# Patient Record
Sex: Female | Born: 1980 | Race: White | Hispanic: No | Marital: Married | Smoking: Former smoker
Health system: Southern US, Community
[De-identification: ages and names within clinical notes are randomized; demographics above are authoritative.]

## PROBLEM LIST (undated history)

## (undated) DIAGNOSIS — M1990 Unspecified osteoarthritis, unspecified site: Secondary | ICD-10-CM

## (undated) DIAGNOSIS — M5030 Other cervical disc degeneration, unspecified cervical region: Secondary | ICD-10-CM

## (undated) DIAGNOSIS — I10 Essential (primary) hypertension: Secondary | ICD-10-CM

## (undated) DIAGNOSIS — R7303 Prediabetes: Secondary | ICD-10-CM

## (undated) DIAGNOSIS — J45909 Unspecified asthma, uncomplicated: Secondary | ICD-10-CM

## (undated) DIAGNOSIS — R609 Edema, unspecified: Secondary | ICD-10-CM

## (undated) DIAGNOSIS — F419 Anxiety disorder, unspecified: Secondary | ICD-10-CM

## (undated) DIAGNOSIS — R7309 Other abnormal glucose: Secondary | ICD-10-CM

## (undated) DIAGNOSIS — F32A Depression, unspecified: Secondary | ICD-10-CM

## (undated) DIAGNOSIS — R42 Dizziness and giddiness: Secondary | ICD-10-CM

## (undated) HISTORY — DX: Dizziness and giddiness: R42

## (undated) HISTORY — PX: TOOTH EXTRACTION: SHX859

## (undated) HISTORY — DX: Unspecified asthma, uncomplicated: J45.909

## (undated) HISTORY — DX: Other abnormal glucose: R73.09

## (undated) HISTORY — DX: Essential (primary) hypertension: I10

## (undated) HISTORY — DX: Prediabetes: R73.03

## (undated) HISTORY — DX: Depression, unspecified: F32.A

## (undated) HISTORY — DX: Anxiety disorder, unspecified: F41.9

## (undated) HISTORY — DX: Unspecified osteoarthritis, unspecified site: M19.90

## (undated) HISTORY — DX: Other cervical disc degeneration, unspecified cervical region: M50.30

---

## 2019-09-21 NOTE — L&D Delivery Note (Signed)
Operative Delivery Note At 7:09 PM a viable female was delivered via Vaginal, Vacuum Neurosurgeon).  Presentation: vertex; Position: Right,, Occiput,, Anterior; Station: +3. Nuchal cord x 1  Verbal consent: obtained from patient.  Risks and benefits discussed in detail.  Risks include, but are not limited to the risks of anesthesia, bleeding, infection, damage to maternal tissues, fetal cephalhematoma.  There is also the risk of inability to effect vaginal delivery of the head, or shoulder dystocia that cannot be resolved by established maneuvers, leading to the need for emergency cesarean section.  APGAR & weight  Not available at the time of the note.   Placenta status:delivered whole and intact with 3-vessel cord. Delayed cord clamping was performed for 1 minute.    Anesthesia:  Epidural  Instruments: Vaccum Episiotomy: None Lacerations: 2nd degree Suture Repair: 2.0 vicryl rapide Est. Blood Loss (mL): 202  Mom to postpartum.  Baby to Couplet care / Skin to Skin.    06/28/2020, 7:49 PM

## 2019-11-27 ENCOUNTER — Encounter: Payer: Self-pay | Admitting: General Practice

## 2020-01-31 ENCOUNTER — Ambulatory Visit (INDEPENDENT_AMBULATORY_CARE_PROVIDER_SITE_OTHER): Payer: Self-pay | Admitting: Advanced Practice Midwife

## 2020-01-31 ENCOUNTER — Other Ambulatory Visit: Payer: Self-pay

## 2020-01-31 ENCOUNTER — Encounter: Payer: Self-pay | Admitting: Advanced Practice Midwife

## 2020-01-31 VITALS — BP 119/74 | HR 84 | Ht 65.0 in | Wt 141.0 lb

## 2020-01-31 DIAGNOSIS — F419 Anxiety disorder, unspecified: Secondary | ICD-10-CM

## 2020-01-31 DIAGNOSIS — O09519 Supervision of elderly primigravida, unspecified trimester: Secondary | ICD-10-CM | POA: Insufficient documentation

## 2020-01-31 DIAGNOSIS — O10919 Unspecified pre-existing hypertension complicating pregnancy, unspecified trimester: Secondary | ICD-10-CM

## 2020-01-31 DIAGNOSIS — Z3A15 15 weeks gestation of pregnancy: Secondary | ICD-10-CM

## 2020-01-31 DIAGNOSIS — O0990 Supervision of high risk pregnancy, unspecified, unspecified trimester: Secondary | ICD-10-CM | POA: Insufficient documentation

## 2020-01-31 DIAGNOSIS — F418 Other specified anxiety disorders: Secondary | ICD-10-CM | POA: Insufficient documentation

## 2020-01-31 DIAGNOSIS — I10 Essential (primary) hypertension: Secondary | ICD-10-CM | POA: Insufficient documentation

## 2020-01-31 DIAGNOSIS — O10012 Pre-existing essential hypertension complicating pregnancy, second trimester: Secondary | ICD-10-CM

## 2020-01-31 DIAGNOSIS — O0992 Supervision of high risk pregnancy, unspecified, second trimester: Secondary | ICD-10-CM

## 2020-01-31 DIAGNOSIS — O99342 Other mental disorders complicating pregnancy, second trimester: Secondary | ICD-10-CM

## 2020-01-31 DIAGNOSIS — Z363 Encounter for antenatal screening for malformations: Secondary | ICD-10-CM

## 2020-01-31 LAB — POCT URINALYSIS DIPSTICK OB
Blood, UA: NEGATIVE
Glucose, UA: NEGATIVE
Ketones, UA: NEGATIVE
Leukocytes, UA: NEGATIVE
Nitrite, UA: NEGATIVE
POC,PROTEIN,UA: NEGATIVE

## 2020-01-31 MED ORDER — HYDROXYZINE PAMOATE 25 MG PO CAPS
25.0000 mg | ORAL_CAPSULE | Freq: Three times a day (TID) | ORAL | 0 refills | Status: DC | PRN
Start: 2020-01-31 — End: 2020-04-01

## 2020-01-31 MED ORDER — ASPIRIN EC 81 MG PO TBEC
162.0000 mg | DELAYED_RELEASE_TABLET | Freq: Every day | ORAL | 6 refills | Status: DC
Start: 2020-01-31 — End: 2020-06-30

## 2020-01-31 NOTE — Progress Notes (Signed)
INITIAL OBSTETRICAL VISIT Patient name: Gabriella Schmidt MRN [id number]  Date of birth: Oct 04, 1980 Chief Complaint:   Initial Prenatal Visit  History of Present Illness:   Gabriella Schmidt is a 39 y.o. G1P0 Caucasian female at [redacted]w[redacted]d by 7 week [name] with an Estimated Date of Delivery: 07/23/20 being seen today for her initial obstetrical visit.   Her obstetrical history is significant for first baby, AMA primagravida.   Today she reports doing OK.  Stopped zoloft, feels ok for now. Wants referral to online anxiety therapy.  Has had CHTN on meds for 8 years .Stopped lisinopril w/+_ UPT  Has BP cuff, bps normal.  Patient's last menstrual period was 10/05/2019. Last pap 2018. Results were: normal Review of Systems:   Pertinent items are noted in HPI Denies cramping/contractions, leakage of fluid, vaginal bleeding, abnormal vaginal discharge w/ itching/odor/irritation, headaches, visual changes, shortness of breath, chest pain, abdominal pain, severe nausea/vomiting, or problems with urination or bowel movements unless otherwise stated above.  Pertinent History Reviewed:  Reviewed past medical,surgical, social, obstetrical and family history.  Reviewed problem list, medications and allergies. OB History  Gravida Para Term Preterm AB Living  1            SAB TAB Ectopic Multiple Live Births               # Outcome Date GA Lbr Len/2nd Weight Sex Delivery Anes PTL Lv  1 Current            Physical Assessment:   Vitals:   01/31/20 1415 01/31/20 1417  BP: 119/74   Pulse: 84   Weight: 141 lb (64 kg)   Height:  5\' 5"  (1.651 m)  Body mass index is 23.46 kg/m.       Physical Examination:  General appearance - well appearing, and in no distress  Mental status - alert, oriented to person, place, and time  Psych:  She has a normal mood and affect  Skin - warm and dry, normal color, no suspicious lesions noted  Chest - effort normal, all lung fields clear to auscultation bilaterally  Heart - normal  rate and regular rhythm  Abdomen - soft, nontender  Extremities:  No swelling or varicosities noted    Fetal Heart Rate (bpm): 161 via doppler  Results for orders placed or performed in visit on 01/31/20 (from the past 24 hour(s))  POC Urinalysis Dipstick OB   Collection Time: 01/31/20  2:46 PM  Result Value Ref Range   Color, UA     Clarity, UA     Glucose, UA Negative Negative   Bilirubin, UA     Ketones, UA neg    Spec Grav, UA     Blood, UA neg    pH, UA     POC,PROTEIN,UA Negative Negative, Trace, Small (1+), Moderate (2+), Large (3+), 4+   Urobilinogen, UA     Nitrite, UA neg    Leukocytes, UA Negative Negative   Appearance     Odor      Assessment & Plan:  1) High-Risk Pregnancy G1P0 at [redacted]w[redacted]d with an Estimated Date of Delivery: 07/23/20   2) Initial OB visit  3) CHTN, no meds.  Baseline 24hr urine:___  Baseline CBC/CMP:___  Baby ASA 162mg  @ 12wks [ ]   Growth u/s @ 20, 24, 28, 33, 36wks     2x/wk testing nst/sono @ 36wks or weekly BPP   Deliver @ 39wks:______   Meds:  Meds ordered this encounter  Medications  . aspirin  EC 81 MG tablet    Sig: Take 2 tablets (162 mg total) by mouth daily.    Dispense:  60 tablet    Refill:  6    Order Specific Question:   Supervising Provider    Answer:   Elonda Husky, LUTHER H [[id number]]  . hydrOXYzine (VISTARIL) 25 MG capsule    Sig: Take 1 capsule (25 mg total) by mouth 3 (three) times daily as needed.    Dispense:  30 capsule    Refill:  0    Order Specific Question:   Supervising Provider    Answer:   Tania Ade H [[id number]]    Initial labs obtained; add CMP/24 hr urine protein Take BP weekly; let us know if >140/90 Start ASA 162mg  Continue prenatal vitamins Reviewed n/v relief measures and warning s/s to report Rx vistaril for prn anxiety Reviewed recommended weight gain based on pre-gravid BMI Encouraged well-balanced diet Watched video for carrier screening/genetic testing:  Genetic:  NIPS:  requested Cystic fibrosis  screening requested SMA screening requested Fragile X screening requested Ultrasound discussed; fetal survey: requested Holiday completed  Follow-up: Return in about 3 weeks (around 02/21/2020) for [place], [id number].   Orders Placed This Encounter  Procedures  . Urine Culture  . US OB Comp + 14 Wk  . Genetic Screening  . Hepatitis C antibody  . Obstetric Panel, Including HIV  . Hgb Fractionation Cascade  . Comprehensive metabolic panel  . Protein, urine, 24 hour  . POC Urinalysis Dipstick OB    Christin Fudge DNP, CNM 01/31/2020 3:04 PM

## 2020-01-31 NOTE — Patient Instructions (Signed)
Gabriella Schmidt, I greatly value your feedback.  If you receive a survey following your visit with [name] today, we appreciate you taking the time to fill it out.  Thanks, Nigel Berthold, DNP, [place]!!! It is now [place] at [facility] ([place], [place] [address]) Entrance located off of [address] parking   Nausea & Vomiting  Have saltine crackers or pretzels by your bed and eat a few bites before you raise your head out of bed in the morning  Eat small frequent meals throughout the day instead of large meals  Drink plenty of fluids throughout the day to stay hydrated, just don't drink a lot of fluids with your meals.  This can make your stomach fill up faster making you feel sick  Do not brush your teeth right after you eat  Products with real ginger are good for nausea, like ginger ale and ginger hard candy Make sure it says made with real ginger!  Sucking on sour candy like lemon heads is also good for nausea  If your prenatal vitamins make you nauseated, take them at night so you will sleep through the nausea  Sea Bands  If you feel like you need medicine for the nausea & vomiting please let us know  If you are unable to keep any fluids or food down please let us know   Constipation  Drink plenty of fluid, preferably water, throughout the day  Eat foods high in fiber such as fruits, vegetables, and grains  Exercise, such as walking, is a good way to keep your bowels regular  Drink warm fluids, especially warm prune juice, or decaf coffee  Eat a 1/2 cup of real oatmeal (not instant), 1/2 cup applesauce, and 1/2-1 cup warm prune juice every day  If needed, you may take Colace (docusate sodium) stool softener once or twice a day to help keep the stool soft.   If you still are having problems with constipation, you may take Miralax once daily as needed to help keep your bowels regular.   Home  Blood Pressure Monitoring for Patients   Your provider has recommended that you check your blood pressure (BP) at least once a week at home. If you do not have a blood pressure cuff at home, one will be provided for you. Contact your provider if you have not received your monitor within 1 week.   Helpful Tips for Accurate Home Blood Pressure Checks  . Don't smoke, exercise, or drink caffeine 30 minutes before checking your BP . Use the restroom before checking your BP (a full bladder can raise your pressure) . Relax in a comfortable upright chair . Feet on the ground . Left arm resting comfortably on a flat surface at the level of your heart . Legs uncrossed . Back supported . Sit quietly and don't talk . Place the cuff on your bare arm . Adjust snuggly, so that only two fingertips can fit between your skin and the top of the cuff . Check 2 readings separated by at least one minute . Keep a log of your BP readings . For a visual, please reference this diagram: http://ccnc.care/bpdiagram  Provider Name: [facility]     Phone: [phone]  Zone 1: ALL CLEAR  Continue to monitor your symptoms:  . BP reading is less than 140 (top number) or less than 90 (bottom number)  . No right upper stomach pain .  No headaches or seeing spots . No feeling nauseated or throwing up . No swelling in face and hands  Zone 2: CAUTION Call your doctor's office for any of the following:  . BP reading is greater than 140 (top number) or greater than 90 (bottom number)  . Stomach pain under your ribs in the middle or right side . Headaches or seeing spots . Feeling nauseated or throwing up . Swelling in face and hands  Zone 3: EMERGENCY  Seek immediate medical care if you have any of the following:  . BP reading is greater than160 (top number) or greater than 110 (bottom number) . Severe headaches not improving with Tylenol . Serious difficulty catching your breath . Any worsening symptoms  from Zone 2    First Trimester of Pregnancy The first trimester of pregnancy is from week 1 until the end of week 12 (months 1 through 3). A week after a sperm fertilizes an egg, the egg will implant on the wall of the uterus. This embryo will begin to develop into a baby. Genes from you and your partner are forming the baby. The female genes determine whether the baby is a boy or a girl. At 6-8 weeks, the eyes and face are formed, and the heartbeat can be seen on ultrasound. At the end of 12 weeks, all the baby's organs are formed.  Now that you are pregnant, you will want to do everything you can to have a healthy baby. Two of the most important things are to get good prenatal care and to follow your health care provider's instructions. Prenatal care is all the medical care you receive before the baby's birth. This care will help prevent, find, and treat any problems during the pregnancy and childbirth. BODY CHANGES Your body goes through many changes during pregnancy. The changes vary from woman to woman.   You may gain or lose a couple of pounds at first.  You may feel sick to your stomach (nauseous) and throw up (vomit). If the vomiting is uncontrollable, call your health care provider.  You may tire easily.  You may develop headaches that can be relieved by medicines approved by your health care provider.  You may urinate more often. Painful urination may mean you have a bladder infection.  You may develop heartburn as a result of your pregnancy.  You may develop constipation because certain hormones are causing the muscles that push waste through your intestines to slow down.  You may develop hemorrhoids or swollen, bulging veins (varicose veins).  Your breasts may begin to grow larger and become tender. Your nipples may stick out more, and the tissue that surrounds them (areola) may become darker.  Your gums may bleed and may be sensitive to brushing and flossing.  Dark spots or  blotches (chloasma, mask of pregnancy) may develop on your face. This will likely fade after the baby is born.  Your menstrual periods will stop.  You may have a loss of appetite.  You may develop cravings for certain kinds of food.  You may have changes in your emotions from day to day, such as being excited to be pregnant or being concerned that something may go wrong with the pregnancy and baby.  You may have more vivid and strange dreams.  You may have changes in your hair. These can include thickening of your hair, rapid growth, and changes in texture. Some women also have hair loss during or after pregnancy, or hair that feels dry  or thin. Your hair will most likely return to normal after your baby is born. WHAT TO EXPECT AT YOUR PRENATAL VISITS During a routine prenatal visit:  You will be weighed to make sure you and the baby are growing normally.  Your blood pressure will be taken.  Your abdomen will be measured to track your baby's growth.  The fetal heartbeat will be listened to starting around week 10 or 12 of your pregnancy.  Test results from any previous visits will be discussed. Your health care provider may ask you:  How you are feeling.  If you are feeling the baby move.  If you have had any abnormal symptoms, such as leaking fluid, bleeding, severe headaches, or abdominal cramping.  If you have any questions. Other tests that may be performed during your first trimester include:  Blood tests to find your blood type and to check for the presence of any previous infections. They will also be used to check for low iron levels (anemia) and Rh antibodies. Later in the pregnancy, blood tests for diabetes will be done along with other tests if problems develop.  Urine tests to check for infections, diabetes, or protein in the urine.  An ultrasound to confirm the proper growth and development of the baby.  An amniocentesis to check for possible genetic  problems.  Fetal screens for spina bifida and Down syndrome.  You may need other tests to make sure you and the baby are doing well. HOME CARE INSTRUCTIONS  Medicines  Follow your health care provider's instructions regarding medicine use. Specific medicines may be either safe or unsafe to take during pregnancy.  Take your prenatal vitamins as directed.  If you develop constipation, try taking a stool softener if your health care provider approves. Diet  Eat regular, well-balanced meals. Choose a variety of foods, such as meat or vegetable-based protein, fish, milk and low-fat dairy products, vegetables, fruits, and whole grain breads and cereals. Your health care provider will help you determine the amount of weight gain that is right for you.  Avoid raw meat and uncooked cheese. These carry germs that can cause birth defects in the baby.  Eating four or five small meals rather than three large meals a day may help relieve nausea and vomiting. If you start to feel nauseous, eating a few soda crackers can be helpful. Drinking liquids between meals instead of during meals also seems to help nausea and vomiting.  If you develop constipation, eat more high-fiber foods, such as fresh vegetables or fruit and whole grains. Drink enough fluids to keep your urine clear or pale yellow. Activity and Exercise  Exercise only as directed by your health care provider. Exercising will help you:  Control your weight.  Stay in shape.  Be prepared for labor and delivery.  Experiencing pain or cramping in the lower abdomen or low back is a good sign that you should stop exercising. Check with your health care provider before continuing normal exercises.  Try to avoid standing for long periods of time. Move your legs often if you must stand in one place for a long time.  Avoid heavy lifting.  Wear low-heeled shoes, and practice good posture.  You may continue to have sex unless your health care  provider directs you otherwise. Relief of Pain or Discomfort  Wear a good support bra for breast tenderness.    Take warm sitz baths to soothe any pain or discomfort caused by hemorrhoids. Use hemorrhoid cream if your  health care provider approves.    Rest with your legs elevated if you have leg cramps or low back pain.  If you develop varicose veins in your legs, wear support hose. Elevate your feet for 15 minutes, 3-4 times a day. Limit salt in your diet. Prenatal Care  Schedule your prenatal visits by the twelfth week of pregnancy. They are usually scheduled monthly at first, then more often in the last 2 months before delivery.  Write down your questions. Take them to your prenatal visits.  Keep all your prenatal visits as directed by your health care provider. Safety  Wear your seat belt at all times when driving.  Make a list of emergency phone numbers, including numbers for family, friends, the hospital, and police and fire departments. General Tips  Ask your health care provider for a referral to a local prenatal education class. Begin classes no later than at the beginning of month 6 of your pregnancy.  Ask for help if you have counseling or nutritional needs during pregnancy. Your health care provider can offer advice or refer you to specialists for help with various needs.  Do not use hot tubs, steam rooms, or saunas.  Do not douche or use tampons or scented sanitary pads.  Do not cross your legs for long periods of time.  Avoid cat litter boxes and soil used by cats. These carry germs that can cause birth defects in the baby and possibly loss of the fetus by miscarriage or stillbirth.  Avoid all smoking, herbs, alcohol, and medicines not prescribed by your health care provider. Chemicals in these affect the formation and growth of the baby.  Schedule a dentist appointment. At home, brush your teeth with a soft toothbrush and be gentle when you floss. SEEK MEDICAL  CARE IF:   You have dizziness.  You have mild pelvic cramps, pelvic pressure, or nagging pain in the abdominal area.  You have persistent nausea, vomiting, or diarrhea.  You have a bad smelling vaginal discharge.  You have pain with urination.  You notice increased swelling in your face, hands, legs, or ankles. SEEK IMMEDIATE MEDICAL CARE IF:   You have a fever.  You are leaking fluid from your vagina.  You have spotting or bleeding from your vagina.  You have severe abdominal cramping or pain.  You have rapid weight gain or loss.  You vomit blood or material that looks like coffee grounds.  You are exposed to [name] measles and have never had them.  You are exposed to fifth disease or chickenpox.  You develop a severe headache.  You have shortness of breath.  You have any kind of trauma, such as from a fall or a car accident. Document Released: 08/31/2001 Document Revised: 01/21/2014 Document Reviewed: 07/17/2013 [facility] Patient Information 2015 [place], [place]. This information is not intended to replace advice given to you by your health care provider. Make sure you discuss any questions you have with your health care provider.  Coronavirus (COVID-19) Are you at risk?  Are you at risk for the Coronavirus (COVID-19)?  To be considered HIGH RISK for Coronavirus (COVID-19), you have to meet the following criteria:  . Traveled to [place], [place], [place], [place] or [place]; or in the [place] to [place], [place], [place], or [place]; and have fever, cough, and shortness of breath within the last 2 weeks of travel OR . Been in close contact with a person diagnosed with COVID-19 within the last 2 weeks and  have fever, cough, and shortness of breath . IF YOU DO NOT MEET THESE CRITERIA, YOU ARE CONSIDERED LOW RISK FOR COVID-19.  What to do if you are HIGH RISK for COVID-19?  Marland Kitchen If you are having a medical emergency, call 911. . Seek medical care right away.  Before you go to a doctor's office, urgent care or emergency department, call ahead and tell them about your recent travel, contact with someone diagnosed with COVID-19, and your symptoms. You should receive instructions from your physician's office regarding next steps of care.  . When you arrive at healthcare provider, tell the healthcare staff immediately you have returned from visiting [place], [place], [place], [place] or [place]; or traveled in the [place] to [place], [place], [place], or [place]; in the last two weeks or you have been in close contact with a person diagnosed with COVID-19 in the last 2 weeks.   . Tell the health care staff about your symptoms: fever, cough and shortness of breath. . After you have been seen by a medical provider, you will be either: o Tested for (COVID-19) and discharged home on quarantine except to seek medical care if symptoms worsen, and asked to  - Stay home and avoid contact with others until you get your results (4-5 days)  - Avoid travel on public transportation if possible (such as bus, train, or airplane) or o Sent to the Emergency Department by EMS for evaluation, COVID-19 testing, and possible admission depending on your condition and test results.  What to do if you are LOW RISK for COVID-19?  Reduce your risk of any infection by using the same precautions used for avoiding the common cold or flu:  Marland Kitchen Wash your hands often with soap and warm water for at least 20 seconds.  If soap and water are not readily available, use an alcohol-based hand sanitizer with at least 60% alcohol.  . If coughing or sneezing, cover your mouth and nose by coughing or sneezing into the elbow areas of your shirt or coat, into a tissue or into your sleeve (not your hands). . Avoid shaking hands with others and consider head nods or verbal greetings only. . Avoid touching your eyes, nose, or mouth with unwashed hands.  . Avoid close contact with people who are  sick. . Avoid places or events with large numbers of people in one location, like concerts or sporting events. . Carefully consider travel plans you have or are making. . If you are planning any travel outside or inside the [name], visit the CDC's Travelers' Health webpage for the latest health notices. . If you have some symptoms but not all symptoms, continue to monitor at home and seek medical attention if your symptoms worsen. . If you are having a medical emergency, call 911.   [place] / e-Visit: [email or web address]         [facility]: [facility]: [id number]                   [facility]: [phone]     Safe Medications in Pregnancy   Acne: Benzoyl Peroxide Salicylic Acid  Backache/Headache: Tylenol: 2 regular strength every 4 hours OR              2 Extra strength every 6 hours  Colds/Coughs/Allergies: Benadryl (alcohol free) 25 mg every 6 hours as needed Breath right strips Claritin Cepacol throat  lozenges Chloraseptic throat spray Cold-Eeze- up to three times per day Cough drops, alcohol free Flonase (by prescription only) Guaifenesin Mucinex Robitussin DM (plain only, alcohol free) Saline nasal spray/drops Sudafed (pseudoephedrine) & Actifed ** use only after [redacted] weeks gestation and if you do not have high blood pressure Tylenol Vicks Vaporub Zinc lozenges Zyrtec   Constipation: Colace Ducolax suppositories Fleet enema Glycerin suppositories Metamucil Milk of magnesia Miralax Senokot Smooth move tea  Diarrhea: Kaopectate Imodium A-D  *NO pepto Bismol  Hemorrhoids: Anusol Anusol HC Preparation H Tucks  Indigestion: Tums Maalox Mylanta Zantac  Pepcid  Insomnia: Benadryl (alcohol free) 25mg  every 6 hours as needed Tylenol PM Unisom, no Gelcaps  Leg  Cramps: Tums MagGel  Nausea/Vomiting:  Bonine Dramamine Emetrol Ginger extract Sea bands Meclizine  Nausea medication to take during pregnancy:  Unisom (doxylamine succinate 25 mg tablets) Take one tablet daily at bedtime. If symptoms are not adequately controlled, the dose can be increased to a maximum recommended dose of two tablets daily (1/2 tablet in the morning, 1/2 tablet mid-afternoon and one at bedtime). Vitamin B6 100mg  tablets. Take one tablet twice a day (up to 200 mg per day).  Skin Rashes: Aveeno products Benadryl cream or 25mg  every 6 hours as needed Calamine Lotion 1% cortisone cream  Yeast infection: Gyne-lotrimin 7 Monistat 7   **If taking multiple medications, please check labels to avoid duplicating the same active ingredients **take medication as directed on the label ** Do not exceed 4000 mg of tylenol in 24 hours **Do not take medications that contain aspirin or ibuprofen

## 2020-02-01 ENCOUNTER — Telehealth: Payer: Self-pay | Admitting: *Deleted

## 2020-02-01 LAB — COMPREHENSIVE METABOLIC PANEL
ALT: 10 IU/L (ref 0–32)
AST: 12 IU/L (ref 0–40)
Albumin/Globulin Ratio: 1.4 (ref 1.2–2.2)
Albumin: 4 g/dL (ref 3.8–4.8)
Alkaline Phosphatase: 85 IU/L (ref 39–117)
BUN/Creatinine Ratio: 20 (ref 9–23)
BUN: 9 mg/dL (ref 6–20)
Bilirubin Total: 0.3 mg/dL (ref 0.0–1.2)
CO2: 21 mmol/L (ref 20–29)
Calcium: 9.4 mg/dL (ref 8.7–10.2)
Chloride: 103 mmol/L (ref 96–106)
Creatinine, Ser: 0.46 mg/dL — ABNORMAL LOW (ref 0.57–1.00)
GFR calc Af Amer: 148 mL/min/{1.73_m2} (ref >59)
GFR calc non Af Amer: 128 mL/min/{1.73_m2} (ref >59)
Globulin, Total: 2.9 g/dL (ref 1.5–4.5)
Glucose: 77 mg/dL (ref 65–99)
Potassium: 4.8 mmol/L (ref 3.5–5.2)
Sodium: 137 mmol/L (ref 134–144)
Total Protein: 6.9 g/dL (ref 6.0–8.5)

## 2020-02-01 NOTE — Telephone Encounter (Signed)
Referral faxed to [facility].

## 2020-02-02 LAB — URINE CULTURE

## 2020-02-04 LAB — OBSTETRIC PANEL, INCLUDING HIV
Antibody Screen: NEGATIVE
Basophils Absolute: 0 10*3/uL (ref 0.0–0.2)
Basos: 0 %
EOS (ABSOLUTE): 0.1 10*3/uL (ref 0.0–0.4)
Eos: 1 %
HIV Screen 4th Generation wRfx: NONREACTIVE
Hematocrit: 35 % (ref 34.0–46.6)
Hemoglobin: 12.1 g/dL (ref 11.1–15.9)
Hepatitis B Surface Ag: NEGATIVE
Immature Grans (Abs): 0.1 10*3/uL (ref 0.0–0.1)
Immature Granulocytes: 1 %
Lymphocytes Absolute: 2.5 10*3/uL (ref 0.7–3.1)
Lymphs: 25 %
MCH: 30.1 pg (ref 26.6–33.0)
MCHC: 34.6 g/dL (ref 31.5–35.7)
MCV: 87 fL (ref 79–97)
Monocytes Absolute: 0.5 10*3/uL (ref 0.1–0.9)
Monocytes: 6 %
Neutrophils Absolute: 6.5 10*3/uL (ref 1.4–7.0)
Neutrophils: 67 %
Platelets: 346 10*3/uL (ref 150–450)
RBC: 4.02 x10E6/uL (ref 3.77–5.28)
RDW: 11.8 % (ref 11.7–15.4)
RPR Ser Ql: NONREACTIVE
Rh Factor: POSITIVE
Rubella Antibodies, IGG: <0.9 index — ABNORMAL LOW (ref >0.99)
WBC: 9.6 10*3/uL (ref 3.4–10.8)

## 2020-02-04 LAB — HGB FRACTIONATION CASCADE
Hgb A2: 2.7 % (ref 1.8–3.2)
Hgb A: 97.3 % (ref 96.4–98.8)
Hgb F: 0 % (ref 0.0–2.0)
Hgb S: 0 %

## 2020-02-04 LAB — HEPATITIS C ANTIBODY: Hep C Virus Ab: <0.1 s/co ratio (ref 0.0–0.9)

## 2020-02-19 ENCOUNTER — Encounter: Payer: Self-pay | Admitting: *Deleted

## 2020-02-19 NOTE — Progress Notes (Signed)
Received fax from [facility] that they have tried to reach patient twice on 5/24 and 5/25.

## 2020-02-21 ENCOUNTER — Other Ambulatory Visit (HOSPITAL_COMMUNITY): Payer: Self-pay | Admitting: Advanced Practice Midwife

## 2020-02-21 ENCOUNTER — Encounter: Payer: Self-pay | Admitting: Obstetrics & Gynecology

## 2020-02-21 ENCOUNTER — Other Ambulatory Visit (HOSPITAL_COMMUNITY)
Admission: RE | Admit: 2020-02-21 | Discharge: 2020-02-21 | Disposition: A | Discharge status: Home / Self Care | Source: Ambulatory Visit | Attending: Obstetrics & Gynecology | Admitting: Obstetrics & Gynecology

## 2020-02-21 ENCOUNTER — Ambulatory Visit (INDEPENDENT_AMBULATORY_CARE_PROVIDER_SITE_OTHER)

## 2020-02-21 ENCOUNTER — Ambulatory Visit (INDEPENDENT_AMBULATORY_CARE_PROVIDER_SITE_OTHER): Admitting: Obstetrics & Gynecology

## 2020-02-21 VITALS — BP 105/71 | HR 81 | Wt 142.5 lb

## 2020-02-21 DIAGNOSIS — Z3A18 18 weeks gestation of pregnancy: Secondary | ICD-10-CM | POA: Diagnosis not present

## 2020-02-21 DIAGNOSIS — O10912 Unspecified pre-existing hypertension complicating pregnancy, second trimester: Secondary | ICD-10-CM

## 2020-02-21 DIAGNOSIS — Z01419 Encounter for gynecological examination (general) (routine) without abnormal findings: Secondary | ICD-10-CM | POA: Insufficient documentation

## 2020-02-21 DIAGNOSIS — Z363 Encounter for antenatal screening for malformations: Secondary | ICD-10-CM | POA: Diagnosis not present

## 2020-02-21 DIAGNOSIS — O10919 Unspecified pre-existing hypertension complicating pregnancy, unspecified trimester: Secondary | ICD-10-CM

## 2020-02-21 DIAGNOSIS — O09512 Supervision of elderly primigravida, second trimester: Secondary | ICD-10-CM

## 2020-02-21 DIAGNOSIS — O0992 Supervision of high risk pregnancy, unspecified, second trimester: Secondary | ICD-10-CM

## 2020-02-21 DIAGNOSIS — Z1389 Encounter for screening for other disorder: Secondary | ICD-10-CM

## 2020-02-21 DIAGNOSIS — Z331 Pregnant state, incidental: Secondary | ICD-10-CM

## 2020-02-21 LAB — POCT URINALYSIS DIPSTICK OB
Blood, UA: NEGATIVE
Glucose, UA: NEGATIVE
Ketones, UA: NEGATIVE
Nitrite, UA: NEGATIVE
POC,PROTEIN,UA: NEGATIVE

## 2020-02-21 NOTE — Progress Notes (Signed)
[name] [id number] wks,cephalic,posterior placenta gr 0,normal left ovary,right ovary not visualized,svp of fluid 3.9 cm,fhr 159 bpm,EFW 255 g 79%,anatomy complete,no obvious abnormalities

## 2020-02-21 NOTE — Progress Notes (Signed)
° °  HIGH-RISK PREGNANCY VISIT Patient name: Gabriella Schmidt MRN [id number]  Date of birth: 1980-11-10 Chief Complaint:   Routine Prenatal Visit (Ultrasound and Pap Smear)  History of Present Illness:   Gabriella Schmidt is a 39 y.o. G1P0 female at [redacted]w[redacted]d with an Estimated Date of Delivery: 07/23/20 being seen today for ongoing management of a high-risk pregnancy complicated by chronic hypertension currently on no meds.  Today she reports no complaints.  Depression screen PHQ 2/9 01/31/2020  Decreased Interest 1  Down, Depressed, Hopeless 0  PHQ - 2 Score 1  Altered sleeping 2  Tired, decreased energy 3  Change in appetite 2  Feeling bad or failure about yourself  0  Trouble concentrating 0  Moving slowly or fidgety/restless 2  Suicidal thoughts 0  PHQ-9 Score 10    Contractions: Not present. Vag. Bleeding: None.  Movement: Present. denies leaking of fluid.  Review of Systems:   Pertinent items are noted in HPI Denies abnormal vaginal discharge w/ itching/odor/irritation, headaches, visual changes, shortness of breath, chest pain, abdominal pain, severe nausea/vomiting, or problems with urination or bowel movements unless otherwise stated above. Pertinent History Reviewed:  Reviewed past medical,surgical, social, obstetrical and family history.  Reviewed problem list, medications and allergies. Physical Assessment:   Vitals:   02/21/20 1420  BP: 105/71  Pulse: 81  Weight: 142 lb 8 oz (64.6 kg)  Body mass index is 23.71 kg/m.           Physical Examination:   General appearance: alert, well appearing, and in no distress  Mental status: alert, oriented to person, place, and time  Skin: warm & dry   Extremities: Edema: None    Cardiovascular: normal heart rate noted  Respiratory: normal respiratory effort, no distress  Abdomen: gravid, soft, non-tender  Pelvic: Cervical exam deferred         Fetal Status:     Movement: Present    Fetal Surveillance Testing today: sonogram    Chaperone: Levy Pupa    No results found for this or any previous visit (from the past 24 hour(s)).  Assessment & Plan:  1) High-risk pregnancy G1P0 at [redacted]w[redacted]d with an Estimated Date of Delivery: 07/23/20   2) CHTN, stable, no meds, was on lisinopril for 10 years, if remains off meds weekly BPP at 32 weeks with EFW 28,32,36 weeks  Meds: No orders of the defined types were placed in this encounter.   Labs/procedures today: sonogram  Treatment Plan:  As above  Reviewed: Preterm labor symptoms and general obstetric precautions including but not limited to vaginal bleeding, contractions, leaking of fluid and fetal movement were reviewed in detail with the patient.  All questions were answered. Has home bp cuff. Rx faxed to . Check bp weekly, let us know if >140/90.   Follow-up: Return in about 4 weeks (around 03/20/2020) for [place].  Orders Placed This Encounter  Procedures   POC Urinalysis Dipstick OB    H   02/21/2020 3:00 PM

## 2020-02-22 LAB — PROTEIN, URINE, 24 HOUR
Protein, 24H Urine: 65 mg/24 hr (ref 30–150)
Protein, Ur: 8.7 mg/dL

## 2020-02-25 LAB — CYTOLOGY - PAP
Comment: NEGATIVE
Diagnosis: NEGATIVE
High risk HPV: NEGATIVE

## 2020-03-25 ENCOUNTER — Ambulatory Visit (INDEPENDENT_AMBULATORY_CARE_PROVIDER_SITE_OTHER): Admitting: Obstetrics & Gynecology

## 2020-03-25 ENCOUNTER — Encounter: Payer: Self-pay | Admitting: Obstetrics & Gynecology

## 2020-03-25 ENCOUNTER — Other Ambulatory Visit: Payer: Self-pay

## 2020-03-25 VITALS — BP 125/76 | HR 109 | Wt 152.0 lb

## 2020-03-25 DIAGNOSIS — Z3A22 22 weeks gestation of pregnancy: Secondary | ICD-10-CM

## 2020-03-25 DIAGNOSIS — Z1389 Encounter for screening for other disorder: Secondary | ICD-10-CM

## 2020-03-25 DIAGNOSIS — O0992 Supervision of high risk pregnancy, unspecified, second trimester: Secondary | ICD-10-CM

## 2020-03-25 DIAGNOSIS — O10912 Unspecified pre-existing hypertension complicating pregnancy, second trimester: Secondary | ICD-10-CM

## 2020-03-25 DIAGNOSIS — Z331 Pregnant state, incidental: Secondary | ICD-10-CM

## 2020-03-25 DIAGNOSIS — O10919 Unspecified pre-existing hypertension complicating pregnancy, unspecified trimester: Secondary | ICD-10-CM

## 2020-03-25 LAB — POCT URINALYSIS DIPSTICK OB
Blood, UA: NEGATIVE
Glucose, UA: NEGATIVE
Ketones, UA: NEGATIVE
Leukocytes, UA: NEGATIVE
Nitrite, UA: NEGATIVE
POC,PROTEIN,UA: NEGATIVE

## 2020-03-25 NOTE — Progress Notes (Signed)
Patient ID: Kaesha Kirsch, female   DOB: 20-Sep-1981, 39 y.o.   MRN: [id number]   [facility] PREGNANCY VISIT Patient name: Olesya Wike MRN [id number]  Date of birth: [birth year] Chief Complaint:   High Risk Gestation  History of Present Illness:   Jackline Castilla is a 39 y.o. G1P0 female at [redacted]w[redacted]d with an Estimated Date of Delivery: 07/23/20 being seen today for ongoing management of a high-risk pregnancy complicated by [facility] on no meds currently.  Today she reports back pain .  Depression screen PHQ 2/9 01/31/2020  Decreased Interest 1  Down, Depressed, Hopeless 0  PHQ - 2 Score 1  Altered sleeping 2  Tired, decreased energy 3  Change in appetite 2  Feeling bad or failure about yourself  0  Trouble concentrating 0  Moving slowly or fidgety/restless 2  Suicidal thoughts 0  PHQ-9 Score 10    Contractions: Not present.  .  Movement: Present. denies leaking of fluid.  Review of Systems:   Pertinent items are noted in HPI Denies abnormal vaginal discharge w/ itching/odor/irritation, headaches, visual changes, shortness of breath, chest pain, abdominal pain, severe nausea/vomiting, or problems with urination or bowel movements unless otherwise stated above. Pertinent History Reviewed:  Reviewed past medical,surgical, social, obstetrical and family history.  Reviewed problem list, medications and allergies. Physical Assessment:   Vitals:   03/25/20 1019  BP: 125/76  Pulse: (!) 109  Weight: 152 lb (68.9 kg)  Body mass index is 25.29 kg/m.           Physical Examination:   General appearance: alert, well appearing, and in no distress  Mental status: alert, oriented to person, place, and time  Skin: warm & dry   Extremities: Edema: None    Cardiovascular: normal heart rate noted  Respiratory: normal respiratory effort, no distress  Abdomen: gravid, soft, non-tender  Pelvic: Cervical exam deferred         Fetal Status:     Movement: Present    Fetal Surveillance Testing today:     Chaperone:     Results for orders placed or performed in visit on 03/25/20 (from the past 24 hour(s))  POC Urinalysis Dipstick OB   Collection Time: 03/25/20 10:19 AM  Result Value Ref Range   Color, UA     Clarity, UA     Glucose, UA Negative Negative   Bilirubin, UA     Ketones, UA neg    Spec Grav, UA     Blood, UA neg    pH, UA     POC,PROTEIN,UA Negative Negative, Trace, Small (1+), Moderate (2+), Large (3+), 4+   Urobilinogen, UA     Nitrite, UA neg    Leukocytes, UA Negative Negative   Appearance     Odor      Assessment & Plan:  1) High-risk pregnancy G1P0 at [redacted]w[redacted]d with an Estimated Date of Delivery: 07/23/20   2) CHTN, no meds, was on lisinopril for 10 years,     Meds: No orders of the defined types were placed in this encounter.   Labs/procedures today:   Treatment Plan:  Per plan, if no meds weekly BPP 32 weeks EFW 28, 32,36 weeks, if meds usual CHTN protocol  Reviewed: Preterm labor symptoms and general obstetric precautions including but not limited to vaginal bleeding, contractions, leaking of fluid and fetal movement were reviewed in detail with the patient.  All questions were answered.  home bp cuff. Rx faxed to . Check bp weekly, let us know if >140/90.  Follow-up: Return in about 4 weeks (around 04/22/2020) for PN2, HROB.  Orders Placed This Encounter  Procedures  . POC Urinalysis Dipstick OB   Mertie Clause   03/25/2020 11:13 AM

## 2020-03-30 ENCOUNTER — Other Ambulatory Visit: Payer: Self-pay

## 2020-04-01 ENCOUNTER — Telehealth: Payer: Self-pay | Admitting: Obstetrics & Gynecology

## 2020-04-01 MED ORDER — HYDROXYZINE PAMOATE 25 MG PO CAPS: 25.0000 mg | ORAL_CAPSULE | Freq: Three times a day (TID) | ORAL | 2 refills | Status: DC | PRN

## 2020-04-01 NOTE — Telephone Encounter (Signed)
I did not receive a MyChart request but refilled it nonetheless

## 2020-04-01 NOTE — Telephone Encounter (Signed)
Patient was calling in because she needs a refill on her medication.  She stated she had sent a my-chart message and that he needs the refill by today because she is going out of town for three weeks.

## 2020-04-08 ENCOUNTER — Encounter: Payer: Self-pay | Admitting: *Deleted

## 2020-04-08 DIAGNOSIS — O0992 Supervision of high risk pregnancy, unspecified, second trimester: Secondary | ICD-10-CM

## 2020-04-24 ENCOUNTER — Ambulatory Visit (INDEPENDENT_AMBULATORY_CARE_PROVIDER_SITE_OTHER): Admitting: Women's Health

## 2020-04-24 ENCOUNTER — Other Ambulatory Visit: Payer: Self-pay

## 2020-04-24 ENCOUNTER — Other Ambulatory Visit

## 2020-04-24 ENCOUNTER — Encounter: Payer: Self-pay | Admitting: Women's Health

## 2020-04-24 VITALS — BP 116/72 | HR 87 | Wt 159.0 lb

## 2020-04-24 DIAGNOSIS — Z3483 Encounter for supervision of other normal pregnancy, third trimester: Secondary | ICD-10-CM

## 2020-04-24 DIAGNOSIS — O0992 Supervision of high risk pregnancy, unspecified, second trimester: Secondary | ICD-10-CM

## 2020-04-24 DIAGNOSIS — O10919 Unspecified pre-existing hypertension complicating pregnancy, unspecified trimester: Secondary | ICD-10-CM

## 2020-04-24 DIAGNOSIS — Z1389 Encounter for screening for other disorder: Secondary | ICD-10-CM

## 2020-04-24 DIAGNOSIS — O10912 Unspecified pre-existing hypertension complicating pregnancy, second trimester: Secondary | ICD-10-CM

## 2020-04-24 DIAGNOSIS — Z23 Encounter for immunization: Secondary | ICD-10-CM

## 2020-04-24 DIAGNOSIS — Z3A27 27 weeks gestation of pregnancy: Secondary | ICD-10-CM

## 2020-04-24 DIAGNOSIS — Z3403 Encounter for supervision of normal first pregnancy, third trimester: Secondary | ICD-10-CM

## 2020-04-24 DIAGNOSIS — Z331 Pregnant state, incidental: Secondary | ICD-10-CM

## 2020-04-24 DIAGNOSIS — Z113 Encounter for screening for infections with a predominantly sexual mode of transmission: Secondary | ICD-10-CM

## 2020-04-24 LAB — POCT URINALYSIS DIPSTICK OB
Blood, UA: NEGATIVE
Glucose, UA: NEGATIVE
Ketones, UA: NEGATIVE
Leukocytes, UA: NEGATIVE
Nitrite, UA: NEGATIVE
POC,PROTEIN,UA: NEGATIVE

## 2020-04-24 NOTE — Patient Instructions (Signed)
Gabriella Schmidt, I greatly value your feedback.  If you receive a survey following your visit with [name] today, we appreciate you taking the time to fill it out.  Thanks, Knute Neu, CNM, WHNP-BC   [facility] [place] [facility] ([place], [place] [address]) Entrance C, located off of [address] parking  Go to [email or web address] to register for FREE online childbirth classes   Call the office [phone]) or go to [facility] if:  You begin to have strong, frequent contractions  Your water breaks.  Sometimes it is a big gush of fluid, sometimes it is just a trickle that keeps getting your panties wet or running down your legs  You have vaginal bleeding.  It is normal to have a small amount of spotting if your cervix was checked.   You don't feel your baby moving like normal.  If you don't, get you something to eat and drink and lay down and focus on feeling your baby move.  You should feel at least 10 movements in 2 hours.  If you don't, you should call the office or go to [facility].    Tdap Vaccine  It is recommended that you get the Tdap vaccine during the third trimester of EACH pregnancy to help protect your baby from getting pertussis (whooping cough)  27-36 weeks is the BEST time to do this so that you can pass the protection on to your baby. During pregnancy is better than after pregnancy, but if you are unable to get it during pregnancy it will be offered at the hospital.   You can get this vaccine with [name], at the health department, your family doctor, or some local pharmacies  Everyone who will be around your baby should also be up-to-date on their vaccines before the baby comes. Adults (who are not pregnant) only need 1 dose of Tdap during adulthood.   [place] Pediatricians/Family Doctors:  [facility] [phone]                 [place]  [phone] (usually not accepting new patients unless you have family there already, you are always welcome to call and ask)       [facility] Department [phone]       [facility] Pediatricians/Family Doctors:   [facility]: [phone]  [facility]: [phone]  [facility]: [place] Doctors:   [facility]: [facility]: [place]:  [place]: [phone]   Home Blood Pressure Monitoring for Patients   Your provider has recommended that you check your blood pressure (BP) at least once a week at home. If you do not have a blood pressure cuff at home, one will be provided for you. Contact your provider if you have not received your monitor within 1 week.   Helpful Tips for Accurate Home Blood Pressure Checks  . Don't smoke, exercise, or drink caffeine 30 minutes before checking your BP . Use the restroom before checking your BP (a full bladder can raise your pressure) . Relax in a comfortable upright chair . Feet on the ground . Left arm resting comfortably on a flat surface at the level of your heart . Legs uncrossed . Back supported . Sit quietly and don't talk . Place the cuff on your bare  arm . Adjust snuggly, so that only two fingertips can fit between your skin and the top of the cuff . Check 2 readings separated by at least one minute . Keep a log of your BP readings . For a visual, please reference this diagram: http://ccnc.care/bpdiagram  Provider Name: [facility]     Phone: [phone]  Zone 1: ALL CLEAR  Continue to monitor your symptoms:  . BP reading is less than 140 (top number) or less than 90 (bottom number)  . No right upper stomach pain . No headaches or seeing spots . No feeling nauseated or throwing up . No swelling in face and hands  Zone 2: CAUTION Call your  doctor's office for any of the following:  . BP reading is greater than 140 (top number) or greater than 90 (bottom number)  . Stomach pain under your ribs in the middle or right side . Headaches or seeing spots . Feeling nauseated or throwing up . Swelling in face and hands  Zone 3: EMERGENCY  Seek immediate medical care if you have any of the following:  . BP reading is greater than160 (top number) or greater than 110 (bottom number) . Severe headaches not improving with Tylenol . Serious difficulty catching your breath . Any worsening symptoms from Zone 2   Third Trimester of Pregnancy The third trimester is from week 29 through week 42, months 7 through 9. The third trimester is a time when the fetus is growing rapidly. At the end of the ninth month, the fetus is about 20 inches in length and weighs 6-10 pounds.  BODY CHANGES Your body goes through many changes during pregnancy. The changes vary from woman to woman.   Your weight will continue to increase. You can expect to gain 25-35 pounds (11-16 kg) by the end of the pregnancy.  You may begin to get stretch marks on your hips, abdomen, and breasts.  You may urinate more often because the fetus is moving lower into your pelvis and pressing on your bladder.  You may develop or continue to have heartburn as a result of your pregnancy.  You may develop constipation because certain hormones are causing the muscles that push waste through your intestines to slow down.  You may develop hemorrhoids or swollen, bulging veins (varicose veins).  You may have pelvic pain because of the weight gain and pregnancy hormones relaxing your joints between the bones in your pelvis. Backaches may result from overexertion of the muscles supporting your posture.  You may have changes in your hair. These can include thickening of your hair, rapid growth, and changes in texture. Some women also have hair loss during or after pregnancy, or hair that  feels dry or thin. Your hair will most likely return to normal after your baby is born.  Your breasts will continue to grow and be tender. A yellow discharge may leak from your breasts called colostrum.  Your belly button may stick out.  You may feel short of breath because of your expanding uterus.  You may notice the fetus "dropping," or moving lower in your abdomen.  You may have a bloody mucus discharge. This usually occurs a few days to a week before labor begins.  Your cervix becomes thin and soft (effaced) near your due date. WHAT TO EXPECT AT YOUR PRENATAL EXAMS  You will have prenatal exams every 2 weeks until week 36. Then, you will have weekly prenatal exams. During a routine prenatal visit:  You  will be weighed to make sure you and the fetus are growing normally.  Your blood pressure is taken.  Your abdomen will be measured to track your baby's growth.  The fetal heartbeat will be listened to.  Any test results from the previous visit will be discussed.  You may have a cervical check near your due date to see if you have effaced. At around 36 weeks, your caregiver will check your cervix. At the same time, your caregiver will also perform a test on the secretions of the vaginal tissue. This test is to determine if a type of bacteria, Group B streptococcus, is present. Your caregiver will explain this further. Your caregiver may ask you:  What your birth plan is.  How you are feeling.  If you are feeling the baby move.  If you have had any abnormal symptoms, such as leaking fluid, bleeding, severe headaches, or abdominal cramping.  If you have any questions. Other tests or screenings that may be performed during your third trimester include:  Blood tests that check for low iron levels (anemia).  Fetal testing to check the health, activity level, and growth of the fetus. Testing is done if you have certain medical conditions or if there are problems during the  pregnancy. FALSE LABOR You may feel small, irregular contractions that eventually go away. These are called Braxton Hicks contractions, or false labor. Contractions may last for hours, days, or even weeks before true labor sets in. If contractions come at regular intervals, intensify, or become painful, it is best to be seen by your caregiver.  SIGNS OF LABOR   Menstrual-like cramps.  Contractions that are 5 minutes apart or less.  Contractions that start on the top of the uterus and spread down to the lower abdomen and back.  A sense of increased pelvic pressure or back pain.  A watery or bloody mucus discharge that comes from the vagina. If you have any of these signs before the 37th week of pregnancy, call your caregiver right away. You need to go to the hospital to get checked immediately. HOME CARE INSTRUCTIONS   Avoid all smoking, herbs, alcohol, and unprescribed drugs. These chemicals affect the formation and growth of the baby.  Follow your caregiver's instructions regarding medicine use. There are medicines that are either safe or unsafe to take during pregnancy.  Exercise only as directed by your caregiver. Experiencing uterine cramps is a good sign to stop exercising.  Continue to eat regular, healthy meals.  Wear a good support bra for breast tenderness.  Do not use hot tubs, steam rooms, or saunas.  Wear your seat belt at all times when driving.  Avoid raw meat, uncooked cheese, cat litter boxes, and soil used by cats. These carry germs that can cause birth defects in the baby.  Take your prenatal vitamins.  Try taking a stool softener (if your caregiver approves) if you develop constipation. Eat more high-fiber foods, such as fresh vegetables or fruit and whole grains. Drink plenty of fluids to keep your urine clear or pale yellow.  Take warm sitz baths to soothe any pain or discomfort caused by hemorrhoids. Use hemorrhoid cream if your caregiver approves.  If you  develop varicose veins, wear support hose. Elevate your feet for 15 minutes, 3-4 times a day. Limit salt in your diet.  Avoid heavy lifting, wear low heal shoes, and practice good posture.  Rest a lot with your legs elevated if you have leg cramps or low back  pain.  Visit your dentist if you have not gone during your pregnancy. Use a soft toothbrush to brush your teeth and be gentle when you floss.  A sexual relationship may be continued unless your caregiver directs you otherwise.  Do not travel far distances unless it is absolutely necessary and only with the approval of your caregiver.  Take prenatal classes to understand, practice, and ask questions about the labor and delivery.  Make a trial run to the hospital.  Pack your hospital bag.  Prepare the baby's nursery.  Continue to go to all your prenatal visits as directed by your caregiver. SEEK MEDICAL CARE IF:  You are unsure if you are in labor or if your water has broken.  You have dizziness.  You have mild pelvic cramps, pelvic pressure, or nagging pain in your abdominal area.  You have persistent nausea, vomiting, or diarrhea.  You have a bad smelling vaginal discharge.  You have pain with urination. SEEK IMMEDIATE MEDICAL CARE IF:   You have a fever.  You are leaking fluid from your vagina.  You have spotting or bleeding from your vagina.  You have severe abdominal cramping or pain.  You have rapid weight loss or gain.  You have shortness of breath with chest pain.  You notice sudden or extreme swelling of your face, hands, ankles, feet, or legs.  You have not felt your baby move in over an hour.  You have severe headaches that do not go away with medicine.  You have vision changes. Document Released: 08/31/2001 Document Revised: 09/11/2013 Document Reviewed: 11/07/2012 [facility] Patient Information 2015 [place], [place]. This information is not intended to replace advice given to you by your health  care provider. Make sure you discuss any questions you have with your health care provider.

## 2020-04-24 NOTE — Progress Notes (Signed)
HIGH-RISK PREGNANCY VISIT Patient name: Gabriella Schmidt MRN [id number]  Date of birth: 1981-05-15 Chief Complaint:   Routine Prenatal Visit (PN2)  History of Present Illness:   Gabriella Schmidt is a 39 y.o. G1P0 female at [redacted]w[redacted]d with an Estimated Date of Delivery: 07/23/20 being seen today for ongoing management of a high-risk pregnancy complicated by chronic hypertension currently on no meds.  Today she reports tail bone pain, chipped it at 39yo. Hurts to sit for long periods and even lay on side. Checking bp BID at home, all wnl except when she went to [facility] to visit family few weeks ago.  Depression screen [facility] 2/9 04/24/2020 01/31/2020  Decreased Interest 0 1  Down, Depressed, Hopeless 1 0  PHQ - 2 Score 1 1  Altered sleeping 1 2  Tired, decreased energy 0 3  Change in appetite 0 2  Feeling bad or failure about yourself  0 0  Trouble concentrating 0 0  Moving slowly or fidgety/restless 0 2  Suicidal thoughts 0 0  PHQ-9 Score 2 10    Contractions: Not present. Vag. Bleeding: None.  Movement: Present. denies leaking of fluid.  Review of Systems:   Pertinent items are noted in HPI Denies abnormal vaginal discharge w/ itching/odor/irritation, headaches, visual changes, shortness of breath, chest pain, abdominal pain, severe nausea/vomiting, or problems with urination or bowel movements unless otherwise stated above. Pertinent History Reviewed:  Reviewed past medical,surgical, social, obstetrical and family history.  Reviewed problem list, medications and allergies. Physical Assessment:   Vitals:   04/24/20 0909  BP: 116/72  Pulse: 87  Weight: 159 lb (72.1 kg)  Body mass index is 26.46 kg/m.           Physical Examination:   General appearance: alert, well appearing, and in no distress  Mental status: alert, oriented to person, place, and time  Skin: warm & dry   Extremities: Edema: None    Cardiovascular: normal heart rate noted  Respiratory: normal respiratory effort, no  distress  Abdomen: gravid, soft, non-tender  Pelvic: Cervical exam deferred         Fetal Status: Fetal Heart Rate (bpm): 151 Fundal Height: 28 cm Movement: Present    Fetal Surveillance Testing today: doppler   Chaperone: n/a    Results for orders placed or performed in visit on 04/24/20 (from the past 24 hour(s))  POC Urinalysis Dipstick OB   Collection Time: 04/24/20  9:09 AM  Result Value Ref Range   Color, UA     Clarity, UA     Glucose, UA Negative Negative   Bilirubin, UA     Ketones, UA neg    Spec Grav, UA     Blood, UA neg    pH, UA     POC,PROTEIN,UA Negative Negative, Trace, Small (1+), Moderate (2+), Large (3+), 4+   Urobilinogen, UA     Nitrite, UA neg    Leukocytes, UA Negative Negative   Appearance     Odor      Assessment & Plan:  1) High-risk pregnancy G1P0 at [redacted]w[redacted]d with an Estimated Date of Delivery: 07/23/20   2) CHTN, stable, no meds, was on lisinopril x 57yr prior to pregnancy, continue checking bp's BID at home, let us know if creeping up  3) Coccyx pain, apap, doughnut pillow  Meds: No orders of the defined types were placed in this encounter.   Labs/procedures today: pn2, tdap  Treatment Plan:  EFW q4wks, 2x/wk testing @ 32wks, IOL @ 39wks  Reviewed:  Preterm labor symptoms and general obstetric precautions including but not limited to vaginal bleeding, contractions, leaking of fluid and fetal movement were reviewed in detail with the patient.  All questions were answered. Has home bp cuff.   Follow-up: Return for 1st available efw u/s (no visit), then 4wks HROB MD/CNM in person w/ EFW u/s.  Orders Placed This Encounter  Procedures  . GC/Chlamydia Probe Amp  . US OB Follow Up  . Tdap vaccine greater than or equal to 7yo IM  . POC Urinalysis Dipstick OB   Roma Schanz CNM, [facility] 04/24/2020 9:41 AM

## 2020-04-25 LAB — CBC
Hematocrit: 33.3 % — ABNORMAL LOW (ref 34.0–46.6)
Hemoglobin: 11 g/dL — ABNORMAL LOW (ref 11.1–15.9)
MCH: 28.3 pg (ref 26.6–33.0)
MCHC: 33 g/dL (ref 31.5–35.7)
MCV: 86 fL (ref 79–97)
Platelets: 263 10*3/uL (ref 150–450)
RBC: 3.89 x10E6/uL (ref 3.77–5.28)
RDW: 12.2 % (ref 11.7–15.4)
WBC: 10.4 10*3/uL (ref 3.4–10.8)

## 2020-04-25 LAB — ANTIBODY SCREEN: Antibody Screen: NEGATIVE

## 2020-04-25 LAB — GLUCOSE TOLERANCE, 2 HOURS W/ 1HR
Glucose, 1 hour: 135 mg/dL (ref 65–179)
Glucose, 2 hour: 135 mg/dL (ref 65–152)
Glucose, Fasting: 73 mg/dL (ref 65–91)

## 2020-04-25 LAB — HIV ANTIBODY (ROUTINE TESTING W REFLEX): HIV Screen 4th Generation wRfx: NONREACTIVE

## 2020-04-25 LAB — RPR: RPR Ser Ql: NONREACTIVE

## 2020-04-27 LAB — GC/CHLAMYDIA PROBE AMP
Chlamydia trachomatis, NAA: NEGATIVE
Neisseria Gonorrhoeae by PCR: NEGATIVE

## 2020-04-28 ENCOUNTER — Ambulatory Visit (INDEPENDENT_AMBULATORY_CARE_PROVIDER_SITE_OTHER)

## 2020-04-28 DIAGNOSIS — O0991 Supervision of high risk pregnancy, unspecified, first trimester: Secondary | ICD-10-CM

## 2020-04-28 DIAGNOSIS — O0992 Supervision of high risk pregnancy, unspecified, second trimester: Secondary | ICD-10-CM

## 2020-04-28 DIAGNOSIS — O10919 Unspecified pre-existing hypertension complicating pregnancy, unspecified trimester: Secondary | ICD-10-CM | POA: Diagnosis not present

## 2020-04-28 DIAGNOSIS — O09511 Supervision of elderly primigravida, first trimester: Secondary | ICD-10-CM

## 2020-04-28 NOTE — Progress Notes (Signed)
[name] 01+6 wks,cephalic,cx 3.2 cm,posterior placenta gr 0,afi 16 cm,fhr 144 bpm,efw 1234 g 67%

## 2020-05-15 ENCOUNTER — Encounter (INDEPENDENT_AMBULATORY_CARE_PROVIDER_SITE_OTHER): Payer: Self-pay

## 2020-05-21 ENCOUNTER — Other Ambulatory Visit: Payer: Self-pay | Admitting: Women's Health

## 2020-05-21 DIAGNOSIS — O10919 Unspecified pre-existing hypertension complicating pregnancy, unspecified trimester: Secondary | ICD-10-CM

## 2020-05-22 ENCOUNTER — Ambulatory Visit (INDEPENDENT_AMBULATORY_CARE_PROVIDER_SITE_OTHER): Admitting: Advanced Practice Midwife

## 2020-05-22 ENCOUNTER — Ambulatory Visit (INDEPENDENT_AMBULATORY_CARE_PROVIDER_SITE_OTHER)

## 2020-05-22 VITALS — BP 122/77 | HR 100 | Wt 162.0 lb

## 2020-05-22 DIAGNOSIS — O09513 Supervision of elderly primigravida, third trimester: Secondary | ICD-10-CM

## 2020-05-22 DIAGNOSIS — Z3A31 31 weeks gestation of pregnancy: Secondary | ICD-10-CM

## 2020-05-22 DIAGNOSIS — O0992 Supervision of high risk pregnancy, unspecified, second trimester: Secondary | ICD-10-CM

## 2020-05-22 DIAGNOSIS — O10919 Unspecified pre-existing hypertension complicating pregnancy, unspecified trimester: Secondary | ICD-10-CM | POA: Diagnosis not present

## 2020-05-22 DIAGNOSIS — Z331 Pregnant state, incidental: Secondary | ICD-10-CM

## 2020-05-22 DIAGNOSIS — Z1389 Encounter for screening for other disorder: Secondary | ICD-10-CM

## 2020-05-22 DIAGNOSIS — O10913 Unspecified pre-existing hypertension complicating pregnancy, third trimester: Secondary | ICD-10-CM

## 2020-05-22 DIAGNOSIS — O0993 Supervision of high risk pregnancy, unspecified, third trimester: Secondary | ICD-10-CM

## 2020-05-22 DIAGNOSIS — O09512 Supervision of elderly primigravida, second trimester: Secondary | ICD-10-CM

## 2020-05-22 LAB — POCT URINALYSIS DIPSTICK OB
Blood, UA: NEGATIVE
Glucose, UA: NEGATIVE
Ketones, UA: NEGATIVE
Leukocytes, UA: NEGATIVE
Nitrite, UA: NEGATIVE
POC,PROTEIN,UA: NEGATIVE

## 2020-05-22 MED ORDER — ALBUTEROL SULFATE HFA 108 (90 BASE) MCG/ACT IN AERS: 2.0000 | INHALATION_SPRAY | Freq: Four times a day (QID) | RESPIRATORY_TRACT | 2 refills | Status: DC | PRN

## 2020-05-22 NOTE — Progress Notes (Signed)
HIGH-RISK PREGNANCY VISIT Patient name: Gabriella Schmidt MRN [id number]  Date of birth: 15-Aug-1981 Chief Complaint:   Routine Prenatal Visit (Ultrasound)  History of Present Illness:   Gabriella Schmidt is a 39 y.o. G1P0 female at [redacted]w[redacted]d with an Estimated Date of Delivery: 07/23/20 being seen today for ongoing management of a high-risk pregnancy complicated by [facility] currently on no meds Today she reports no complaints. Contractions: Not present. Vag. Bleeding: None.  Movement: Present. denies leaking of fluid.  Review of Systems:   Pertinent items are noted in HPI Denies abnormal vaginal discharge w/ itching/odor/irritation, headaches, visual changes, shortness of breath, chest pain, abdominal pain, severe nausea/vomiting, or problems with urination or bowel movements unless otherwise stated above. Pertinent History Reviewed:  Reviewed past medical,surgical, social, obstetrical and family history.  Reviewed problem list, medications and allergies. Physical Assessment:   Vitals:   05/22/20 0907  BP: 122/77  Pulse: 100  Weight: 162 lb (73.5 kg)  Body mass index is 26.96 kg/m.           Physical Examination:   General appearance: alert, well appearing, and in no distress  Mental status: alert, oriented to person, place, and time  Skin: warm & dry   Extremities: Edema: None    Cardiovascular: normal heart rate noted  Respiratory: normal respiratory effort, no distress  Abdomen: gravid, soft, non-tender  Pelvic: Cervical exam deferred         Fetal Status:     Movement: Present    Fetal Surveillance Testing today: [name] 33+0 wks,cephalic,posterior placenta gr 0,afi 11 cm,fhr 154 bpm,EFW 1806 g 54%   Results for orders placed or performed in visit on 05/22/20 (from the past 24 hour(s))  POC Urinalysis Dipstick OB   Collection Time: 05/22/20  9:06 AM  Result Value Ref Range   Color, UA     Clarity, UA     Glucose, UA Negative Negative   Bilirubin, UA     Ketones, UA n    Spec Grav, UA       Blood, UA n    pH, UA     POC,PROTEIN,UA Negative Negative, Trace, Small (1+), Moderate (2+), Large (3+), 4+   Urobilinogen, UA     Nitrite, UA n    Leukocytes, UA Negative Negative   Appearance     Odor      Assessment & Plan:  1) High-risk pregnancy G1P0 at [redacted]w[redacted]d with an Estimated Date of Delivery: 07/23/20   2) CHTN, no meds, stable Treatment plan  Continue ASA.  Start twice weekly testing next weeks.   3) Coccyx pain w/preg.  Offered pelvic PT referral--will let us know (@ [facility])   4)  Anxiety: inhaler rx'd (hx mild asthma)--sometimes anxious about breathing being more difficult d/t pg  Meds:  Meds ordered this encounter  Medications   albuterol (VENTOLIN HFA) 108 (90 Base) MCG/ACT inhaler    Sig: Inhale 2 puffs into the lungs every 6 (six) hours as needed for wheezing or shortness of breath.    Dispense:  8 g    Refill:  2    Order Specific Question:   Supervising Provider    Answer:   Florian Buff [[id number]]    Labs/procedures today: EFW   Reviewed: Preterm labor symptoms and general obstetric precautions including but not limited to vaginal bleeding, contractions, leaking of fluid and fetal movement were reviewed in detail with the patient.  All questions were answered. Has home bp cuff. BPs all <140/90 at home.  Check bp weekly, let us know if >140/90.   Follow-up: Return for  Start Thurs US/HROB and Mon NST w/RN on 9/9.  Future Appointments  Date Time Provider [place]  06/02/2020 10:30 AM [facility] NURSE [facility]  06/09/2020 10:30 AM [facility] NURSE [facility]  06/12/2020  8:30 AM [facility] - FTOBGYN [name] [facility] None  06/12/2020  9:30 AM Jonnie Kind, MD [facility]  06/16/2020 10:30 AM [facility] NURSE [facility]  06/23/2020 10:30 AM [facility] NURSE [facility]  06/26/2020  8:30 AM [facility] - FTOBGYN [name] [facility] None  06/30/2020 10:30 AM [facility] NURSE [facility]  07/03/2020  8:30 AM [facility] - FTOBGYN [name] [facility] None   07/07/2020 10:30 AM [facility] NURSE [facility]  07/10/2020  8:30 AM [facility] - FTOBGYN [name] [facility] None    Orders Placed This Encounter  Procedures   POC Urinalysis Dipstick OB   Christin Fudge DNP, CNM 05/22/2020 9:36 AM

## 2020-05-22 NOTE — Progress Notes (Signed)
[name] 84+0 wks,cephalic,posterior placenta gr 0,afi 11 cm,fhr 154 bpm,EFW 1806 g 54%

## 2020-05-22 NOTE — Patient Instructions (Signed)
Gabriella Schmidt, I greatly value your feedback.  If you receive a survey following your visit with [name] today, we appreciate you taking the time to fill it out.  Thanks, Gabriella Berthold, DNP, CNM  [place]!!! It is now [place] at [facility] ([place], [place] [address]) Entrance located off of [address] parking   Go to [email or web address] to register for FREE online childbirth classes    Call the office [phone]) or go to [place] if:  You begin to have strong, frequent contractions  Your water breaks.  Sometimes it is a big gush of fluid, sometimes it is just a trickle that keeps getting your panties wet or running down your legs  You have vaginal bleeding.  It is normal to have a small amount of spotting if your cervix was checked.   You don't feel your baby moving like normal.  If you don't, get you something to eat and drink and lay down and focus on feeling your baby move.  You should feel at least 10 movements in 2 hours.  If you don't, you should call the office or go to [place] Blood Pressure Monitoring for Patients   Your provider has recommended that you check your blood pressure (BP) at least once a week at home. If you do not have a blood pressure cuff at home, one will be provided for you. Contact your provider if you have not received your monitor within 1 week.   Helpful Tips for Accurate Home Blood Pressure Checks  . Don't smoke, exercise, or drink caffeine 30 minutes before checking your BP . Use the restroom before checking your BP (a full bladder can raise your pressure) . Relax in a comfortable upright chair . Feet on the ground . Left arm resting comfortably on a flat surface at the level of your heart . Legs uncrossed . Back supported . Sit quietly and don't talk . Place the cuff on your bare arm . Adjust snuggly, so that only two fingertips can fit  between your skin and the top of the cuff . Check 2 readings separated by at least one minute . Keep a log of your BP readings . For a visual, please reference this diagram: http://ccnc.care/bpdiagram  Provider Name: [facility]     Phone: [phone]  Zone 1: ALL CLEAR  Continue to monitor your symptoms:  . BP reading is less than 140 (top number) or less than 90 (bottom number)  . No right upper stomach pain . No headaches or seeing spots . No feeling nauseated or throwing up . No swelling in face and hands  Zone 2: CAUTION Call your doctor's office for any of the following:  . BP reading is greater than 140 (top number) or greater than 90 (bottom number)  . Stomach pain under your ribs in the middle or right side . Headaches or seeing spots . Feeling nauseated or throwing up . Swelling in face and hands  Zone 3: EMERGENCY  Seek immediate medical care if you have any of the following:  . BP reading is greater than160 (top number) or greater than 110 (bottom number) . Severe headaches not improving with Tylenol . Serious difficulty catching your breath . Any worsening symptoms from Zone 2

## 2020-05-27 ENCOUNTER — Other Ambulatory Visit: Payer: Self-pay | Admitting: *Deleted

## 2020-05-27 ENCOUNTER — Telehealth: Payer: Self-pay | Admitting: Obstetrics & Gynecology

## 2020-05-27 ENCOUNTER — Telehealth: Payer: Self-pay | Admitting: *Deleted

## 2020-05-27 DIAGNOSIS — O0993 Supervision of high risk pregnancy, unspecified, third trimester: Secondary | ICD-10-CM

## 2020-05-27 DIAGNOSIS — O10919 Unspecified pre-existing hypertension complicating pregnancy, unspecified trimester: Secondary | ICD-10-CM

## 2020-05-27 NOTE — Telephone Encounter (Signed)
LMOVM that BPP is scheduled at [facility] on 9/9 at 8am. Advised to call our office if this was not going to work for her.

## 2020-05-27 NOTE — Telephone Encounter (Signed)
Patient called stating that she came into the office last week and she was suppose to have her [name] scheduled at [facility] for Thursday and she has not received a call with appointment date and time please contact pt

## 2020-05-29 ENCOUNTER — Ambulatory Visit: Admitting: *Deleted

## 2020-05-29 ENCOUNTER — Other Ambulatory Visit: Payer: Self-pay

## 2020-05-29 ENCOUNTER — Ambulatory Visit: Attending: Obstetrics and Gynecology

## 2020-05-29 DIAGNOSIS — O09513 Supervision of elderly primigravida, third trimester: Secondary | ICD-10-CM | POA: Insufficient documentation

## 2020-05-29 DIAGNOSIS — O0993 Supervision of high risk pregnancy, unspecified, third trimester: Secondary | ICD-10-CM

## 2020-05-29 DIAGNOSIS — O10919 Unspecified pre-existing hypertension complicating pregnancy, unspecified trimester: Secondary | ICD-10-CM | POA: Insufficient documentation

## 2020-06-02 ENCOUNTER — Ambulatory Visit (INDEPENDENT_AMBULATORY_CARE_PROVIDER_SITE_OTHER): Admitting: *Deleted

## 2020-06-02 VITALS — BP 108/69 | HR 82 | Wt 162.0 lb

## 2020-06-02 DIAGNOSIS — O10919 Unspecified pre-existing hypertension complicating pregnancy, unspecified trimester: Secondary | ICD-10-CM

## 2020-06-02 DIAGNOSIS — Z1389 Encounter for screening for other disorder: Secondary | ICD-10-CM

## 2020-06-02 DIAGNOSIS — Z331 Pregnant state, incidental: Secondary | ICD-10-CM

## 2020-06-02 DIAGNOSIS — O0993 Supervision of high risk pregnancy, unspecified, third trimester: Secondary | ICD-10-CM | POA: Diagnosis not present

## 2020-06-02 DIAGNOSIS — Z3A32 32 weeks gestation of pregnancy: Secondary | ICD-10-CM

## 2020-06-02 LAB — POCT URINALYSIS DIPSTICK OB
Blood, UA: NEGATIVE
Glucose, UA: NEGATIVE
Ketones, UA: NEGATIVE
Leukocytes, UA: NEGATIVE
Nitrite, UA: NEGATIVE
POC,PROTEIN,UA: NEGATIVE

## 2020-06-02 NOTE — Progress Notes (Addendum)
   NURSE VISIT- NST  SUBJECTIVE:  Gabriella Schmidt is a 39 y.o. G1P0 female at [redacted]w[redacted]d, here for a NST for pregnancy complicated by [facility].  She reports active fetal movement, contractions: none, vaginal bleeding: none, membranes: intact.   OBJECTIVE:  BP 108/69   Pulse 82   Wt 162 lb (73.5 kg)   LMP 10/05/2019   BMI 26.96 kg/m   Appears well, no apparent distress  Results for orders placed or performed in visit on 06/02/20 (from the past 24 hour(s))  POC Urinalysis Dipstick OB   Collection Time: 06/02/20 10:44 AM  Result Value Ref Range   Color, UA     Clarity, UA     Glucose, UA Negative Negative   Bilirubin, UA     Ketones, UA n    Spec Grav, UA     Blood, UA n    pH, UA     POC,PROTEIN,UA Negative Negative, Trace, Small (1+), Moderate (2+), Large (3+), 4+   Urobilinogen, UA     Nitrite, UA n    Leukocytes, UA Negative Negative   Appearance     Odor      NST: FHR baseline 130 bpm, Variability: moderate, Accelerations:present, Decelerations:  Absent= Cat 1/Reactive Toco: none   ASSESSMENT: G1P0 at [redacted]w[redacted]d with CHTN NST reactive  PLAN: EFM strip reviewed by Dr. Elonda Husky   Recommendations: keep next appointment as scheduled    Alice Rieger  06/02/2020 3:19 PM   Attestation of Attending Supervision of Nursing Visit Encounter: Evaluation and management procedures were performed by the nursing staff under my supervision and collaboration.  I have reviewed the nurse's note and chart, and I agree with the management and plan.  Jacelyn Grip MD Attending Physician for the Center for [facility] 07/09/2020 8:35 PM

## 2020-06-04 ENCOUNTER — Other Ambulatory Visit: Payer: Self-pay | Admitting: Obstetrics & Gynecology

## 2020-06-04 DIAGNOSIS — O09523 Supervision of elderly multigravida, third trimester: Secondary | ICD-10-CM

## 2020-06-04 DIAGNOSIS — O10919 Unspecified pre-existing hypertension complicating pregnancy, unspecified trimester: Secondary | ICD-10-CM

## 2020-06-05 ENCOUNTER — Ambulatory Visit (INDEPENDENT_AMBULATORY_CARE_PROVIDER_SITE_OTHER)

## 2020-06-05 ENCOUNTER — Encounter: Payer: Self-pay | Admitting: Advanced Practice Midwife

## 2020-06-05 ENCOUNTER — Ambulatory Visit (INDEPENDENT_AMBULATORY_CARE_PROVIDER_SITE_OTHER): Admitting: Advanced Practice Midwife

## 2020-06-05 VITALS — BP 114/75 | HR 78 | Wt 161.5 lb

## 2020-06-05 DIAGNOSIS — O09523 Supervision of elderly multigravida, third trimester: Secondary | ICD-10-CM | POA: Diagnosis not present

## 2020-06-05 DIAGNOSIS — O0993 Supervision of high risk pregnancy, unspecified, third trimester: Secondary | ICD-10-CM

## 2020-06-05 DIAGNOSIS — Z3A33 33 weeks gestation of pregnancy: Secondary | ICD-10-CM | POA: Diagnosis not present

## 2020-06-05 DIAGNOSIS — O09513 Supervision of elderly primigravida, third trimester: Secondary | ICD-10-CM

## 2020-06-05 DIAGNOSIS — Z331 Pregnant state, incidental: Secondary | ICD-10-CM

## 2020-06-05 DIAGNOSIS — O10919 Unspecified pre-existing hypertension complicating pregnancy, unspecified trimester: Secondary | ICD-10-CM

## 2020-06-05 DIAGNOSIS — Z1389 Encounter for screening for other disorder: Secondary | ICD-10-CM

## 2020-06-05 LAB — POCT URINALYSIS DIPSTICK OB
Blood, UA: NEGATIVE
Glucose, UA: NEGATIVE
Ketones, UA: NEGATIVE
Nitrite, UA: NEGATIVE
POC,PROTEIN,UA: NEGATIVE

## 2020-06-05 NOTE — Progress Notes (Signed)
HIGH-RISK PREGNANCY VISIT Patient name: Gabriella Schmidt MRN [id number]  Date of birth: June 13, 1981 Chief Complaint:   High Risk Gestation ([name] today)  History of Present Illness:   Gabriella Schmidt is a 39 y.o. G1P0 female at [redacted]w[redacted]d with an Estimated Date of Delivery: 07/23/20 being seen today for ongoing management of a high-risk pregnancy complicated by [facility] currently on no meds Today she reports backache. Contractions: Irregular. Vag. Bleeding: None.  Movement: Present. denies leaking of fluid.  Review of Systems:   Pertinent items are noted in HPI Denies abnormal vaginal discharge w/ itching/odor/irritation, headaches, visual changes, shortness of breath, chest pain, abdominal pain, severe nausea/vomiting, or problems with urination or bowel movements unless otherwise stated above. Pertinent History Reviewed:  Reviewed past medical,surgical, social, obstetrical and family history.  Reviewed problem list, medications and allergies. Physical Assessment:   Vitals:   06/05/20 1606  BP: 114/75  Pulse: 78  Weight: 161 lb 8 oz (73.3 kg)  Body mass index is 26.88 kg/m.           Physical Examination:   General appearance: alert, well appearing, and in no distress  Mental status: alert, oriented to person, place, and time  Skin: warm & dry   Extremities: Edema: Trace    Cardiovascular: normal heart rate noted  Respiratory: normal respiratory effort, no distress  Abdomen: gravid, soft, non-tender  Pelvic: Cervical exam deferred         Fetal Status: Fetal Heart Rate (bpm): [name]   Movement: Present    Fetal Surveillance Testing today: [name] 74+0 wks,cephalic,BPP 8/1,KGY 185 BPM,posterior placenta gr 1,AFI 12.5 cm   Results for orders placed or performed in visit on 06/05/20 (from the past 24 hour(s))  POC Urinalysis Dipstick OB   Collection Time: 06/05/20  4:03 PM  Result Value Ref Range   Color, UA     Clarity, UA     Glucose, UA Negative Negative   Bilirubin, UA     Ketones, UA neg     Spec Grav, UA     Blood, UA neg    pH, UA     POC,PROTEIN,UA Negative Negative, Trace, Small (1+), Moderate (2+), Large (3+), 4+   Urobilinogen, UA     Nitrite, UA neg    Leukocytes, UA Trace (A) Negative   Appearance     Odor      Assessment & Plan:  1) High-risk pregnancy G1P0 at [redacted]w[redacted]d with an Estimated Date of Delivery: 07/23/20   2) CHTN, stable Treatment plan  weeklyBPP   Meds: No orders of the defined types were placed in this encounter.   Labs/procedures today: BPP   Reviewed: Preterm labor symptoms and general obstetric precautions including but not limited to vaginal bleeding, contractions, leaking of fluid and fetal movement were reviewed in detail with the patient.  All questions were answered. Has home bp cu. Check bp weekly, let us know if >140/90.   Follow-up: Return for As scheduled.  Future Appointments  Date Time Provider [place]  06/09/2020  9:10 AM [facility] NURSE [facility]  06/12/2020  8:30 AM [place] - FTOBGYN [name] C[facility]-FT[facility]G None  06/12/2020  9:30 AM Jonnie Kind, MD [facility]  06/16/2020 10:30 AM [facility] NURSE [facility]  06/23/2020 10:30 AM [facility] NURSE [facility]  06/26/2020  8:30 AM [place] - FTOBGYN [name] C[facility]-FT[facility] None  06/26/2020  9:30 AM Christin Fudge, CNM [facility]  06/30/2020 10:30 AM [facility] NURSE [facility]  07/03/2020  8:30 AM [facility] - FTOBGYN [name]  C[facility]-FT[facility]G None  07/03/2020  9:30 AM Florian Buff, MD [facility]  07/07/2020 10:30 AM [facility]FT[facility] NURSE [facility]  07/10/2020  8:30 AM [facility] - FTOBGYN [name] [facility]-FT[facility] None  07/10/2020  9:30 AM Christin Fudge, CNM [facility]  07/14/2020 10:30 AM [facility] NURSE [facility]  07/21/2020 10:30 AM [facility] NURSE [facility]    Orders Placed This Encounter  Procedures  . POC Urinalysis Dipstick OB   Christin Fudge DNP, CNM 06/05/2020 4:43 PM

## 2020-06-05 NOTE — Progress Notes (Signed)
[name] 80+3 wks,cephalic,BPP 2/1,YYQ 825 BPM,posterior placenta gr 1,AFI 12.5 cm

## 2020-06-05 NOTE — Patient Instructions (Signed)
Gabriella Schmidt, I greatly value your feedback.  If you receive a survey following your visit with [name] today, we appreciate you taking the time to fill it out.  Thanks, Nigel Berthold, DNP, CNM  [place]!!! It is now [place] at [facility] ([place], [place] [address]) Entrance located off of [place] parking   Go to [email or web address] to register for FREE online childbirth classes    Call the office [phone]) or go to [place] if:  You begin to have strong, frequent contractions  Your water breaks.  Sometimes it is a big gush of fluid, sometimes it is just a trickle that keeps getting your panties wet or running down your legs  You have vaginal bleeding.  It is normal to have a small amount of spotting if your cervix was checked.   You don't feel your baby moving like normal.  If you don't, get you something to eat and drink and lay down and focus on feeling your baby move.  You should feel at least 10 movements in 2 hours.  If you don't, you should call the office or go to [place] Blood Pressure Monitoring for Patients   Your provider has recommended that you check your blood pressure (BP) at least once a week at home. If you do not have a blood pressure cuff at home, one will be provided for you. Contact your provider if you have not received your monitor within 1 week.   Helpful Tips for Accurate Home Blood Pressure Checks  . Don't smoke, exercise, or drink caffeine 30 minutes before checking your BP . Use the restroom before checking your BP (a full bladder can raise your pressure) . Relax in a comfortable upright chair . Feet on the ground . Left arm resting comfortably on a flat surface at the level of your heart . Legs uncrossed . Back supported . Sit quietly and don't talk . Place the cuff on your bare arm . Adjust snuggly, so that only two fingertips can fit  between your skin and the top of the cuff . Check 2 readings separated by at least one minute . Keep a log of your BP readings . For a visual, please reference this diagram: http://ccnc.care/bpdiagram  Provider Name: [facility]     Phone: [phone]  Zone 1: ALL CLEAR  Continue to monitor your symptoms:  . BP reading is less than 140 (top number) or less than 90 (bottom number)  . No right upper stomach pain . No headaches or seeing spots . No feeling nauseated or throwing up . No swelling in face and hands  Zone 2: CAUTION Call your doctor's office for any of the following:  . BP reading is greater than 140 (top number) or greater than 90 (bottom number)  . Stomach pain under your ribs in the middle or right side . Headaches or seeing spots . Feeling nauseated or throwing up . Swelling in face and hands  Zone 3: EMERGENCY  Seek immediate medical care if you have any of the following:  . BP reading is greater than160 (top number) or greater than 110 (bottom number) . Severe headaches not improving with Tylenol . Serious difficulty catching your breath . Any worsening symptoms from Zone 2

## 2020-06-09 ENCOUNTER — Ambulatory Visit (INDEPENDENT_AMBULATORY_CARE_PROVIDER_SITE_OTHER): Admitting: *Deleted

## 2020-06-09 ENCOUNTER — Other Ambulatory Visit

## 2020-06-09 VITALS — BP 123/75 | HR 95

## 2020-06-09 DIAGNOSIS — O10919 Unspecified pre-existing hypertension complicating pregnancy, unspecified trimester: Secondary | ICD-10-CM

## 2020-06-09 DIAGNOSIS — O0993 Supervision of high risk pregnancy, unspecified, third trimester: Secondary | ICD-10-CM

## 2020-06-09 DIAGNOSIS — Z1389 Encounter for screening for other disorder: Secondary | ICD-10-CM

## 2020-06-09 DIAGNOSIS — Z331 Pregnant state, incidental: Secondary | ICD-10-CM

## 2020-06-09 DIAGNOSIS — Z3A33 33 weeks gestation of pregnancy: Secondary | ICD-10-CM

## 2020-06-09 DIAGNOSIS — O09513 Supervision of elderly primigravida, third trimester: Secondary | ICD-10-CM

## 2020-06-09 LAB — POCT URINALYSIS DIPSTICK OB
Blood, UA: NEGATIVE
Glucose, UA: NEGATIVE
Ketones, UA: NEGATIVE
Leukocytes, UA: NEGATIVE
Nitrite, UA: NEGATIVE
POC,PROTEIN,UA: NEGATIVE

## 2020-06-09 NOTE — Progress Notes (Addendum)
   NURSE VISIT- NST  SUBJECTIVE:  Gabriella Schmidt is a 39 y.o. G1P0 female at [redacted]w[redacted]d, here for a NST for pregnancy complicated by [facility].  She reports active fetal movement, contractions: none, vaginal bleeding: none, membranes: intact.   OBJECTIVE:  BP 123/75   Pulse 95   LMP 10/05/2019   Appears well, no apparent distress  Results for orders placed or performed in visit on 06/09/20 (from the past 24 hour(s))  POC Urinalysis Dipstick OB   Collection Time: 06/09/20  9:08 AM  Result Value Ref Range   Color, UA     Clarity, UA     Glucose, UA Negative Negative   Bilirubin, UA     Ketones, UA neg    Spec Grav, UA     Blood, UA neg    pH, UA     POC,PROTEIN,UA Negative Negative, Trace, Small (1+), Moderate (2+), Large (3+), 4+   Urobilinogen, UA     Nitrite, UA neg    Leukocytes, UA Negative Negative   Appearance     Odor      NST: FHR baseline 140 bpm, Variability: moderate, Accelerations:present, Decelerations:  Absent= Cat 1/Reactive Toco: none   ASSESSMENT: G1P0 at [redacted]w[redacted]d with CHTN NST reactive  PLAN: EFM strip reviewed by Knute Neu, CNM, [facility]   Recommendations: keep next appointment as scheduled    Alice Rieger  06/09/2020 9:37 AM  Chart reviewed for nurse visit. Agree with plan of care.  Roma Schanz, [place] 06/09/2020 10:07 AM

## 2020-06-11 ENCOUNTER — Other Ambulatory Visit: Payer: Self-pay | Admitting: Advanced Practice Midwife

## 2020-06-11 DIAGNOSIS — O10919 Unspecified pre-existing hypertension complicating pregnancy, unspecified trimester: Secondary | ICD-10-CM

## 2020-06-11 DIAGNOSIS — O09523 Supervision of elderly multigravida, third trimester: Secondary | ICD-10-CM

## 2020-06-12 ENCOUNTER — Ambulatory Visit (INDEPENDENT_AMBULATORY_CARE_PROVIDER_SITE_OTHER): Admitting: Advanced Practice Midwife

## 2020-06-12 ENCOUNTER — Ambulatory Visit (INDEPENDENT_AMBULATORY_CARE_PROVIDER_SITE_OTHER)

## 2020-06-12 VITALS — BP 130/80 | HR 103 | Wt 164.0 lb

## 2020-06-12 DIAGNOSIS — O10919 Unspecified pre-existing hypertension complicating pregnancy, unspecified trimester: Secondary | ICD-10-CM | POA: Diagnosis not present

## 2020-06-12 DIAGNOSIS — O09523 Supervision of elderly multigravida, third trimester: Secondary | ICD-10-CM | POA: Diagnosis not present

## 2020-06-12 DIAGNOSIS — O0993 Supervision of high risk pregnancy, unspecified, third trimester: Secondary | ICD-10-CM

## 2020-06-12 DIAGNOSIS — Z331 Pregnant state, incidental: Secondary | ICD-10-CM

## 2020-06-12 DIAGNOSIS — Z1389 Encounter for screening for other disorder: Secondary | ICD-10-CM

## 2020-06-12 DIAGNOSIS — Z3A34 34 weeks gestation of pregnancy: Secondary | ICD-10-CM

## 2020-06-12 LAB — POCT URINALYSIS DIPSTICK OB
Blood, UA: NEGATIVE
Glucose, UA: NEGATIVE
Ketones, UA: NEGATIVE
Nitrite, UA: NEGATIVE
POC,PROTEIN,UA: NEGATIVE

## 2020-06-12 NOTE — Patient Instructions (Signed)

## 2020-06-12 NOTE — Progress Notes (Signed)
[name] 75+3 wks,cephalic,BPP 3/9,PBHEBBWNJ placenta gr 1,fhr 124 bpm,AFI 13 cm

## 2020-06-12 NOTE — Progress Notes (Signed)
HIGH-RISK PREGNANCY VISIT Patient name: Gabriella Schmidt MRN [id number]  Date of birth: 25-May-1981 Chief Complaint:   Routine Prenatal Visit (BPP)  History of Present Illness:   Gabriella Schmidt is a 39 y.o. G1P0 female at [redacted]w[redacted]d with an Estimated Date of Delivery: 07/23/20 being seen today for ongoing management of a high-risk pregnancy complicated by [facility] currently on no meds Today she reports some cramping yesterday. . Contractions: Irregular. Vag. Bleeding: None.  Movement: Present. denies leaking of fluid.  Review of Systems:   Pertinent items are noted in HPI Denies abnormal vaginal discharge w/ itching/odor/irritation, headaches, visual changes, shortness of breath, chest pain, abdominal pain, severe nausea/vomiting, or problems with urination or bowel movements unless otherwise stated above. Pertinent History Reviewed:  Reviewed past medical,surgical, social, obstetrical and family history.  Reviewed problem list, medications and allergies. Physical Assessment:   Vitals:   06/12/20 0919  BP: 130/80  Pulse: (!) 103  Weight: 164 lb (74.4 kg)  Body mass index is 27.29 kg/m.           Physical Examination:   General appearance: alert, well appearing, and in no distress  Mental status: alert, oriented to person, place, and time  Skin: warm & dry   Extremities: Edema: Trace    Cardiovascular: normal heart rate noted  Respiratory: normal respiratory effort, no distress  Abdomen: gravid, soft, non-tender  Pelvic: Cervical exam deferred         Fetal Status:     Movement: Present    Fetal Surveillance Testing today: [name] 35+7 wks,cephalic,BPP 0/1,[id number] placenta gr 1,fhr 124 bpm,AFI 13 cm   Results for orders placed or performed in visit on 06/12/20 (from the past 24 hour(s))  POC Urinalysis Dipstick OB   Collection Time: 06/12/20  9:20 AM  Result Value Ref Range   Color, UA     Clarity, UA     Glucose, UA Negative Negative   Bilirubin, UA     Ketones, UA neg    Spec Grav, UA      Blood, UA neg    pH, UA     POC,PROTEIN,UA Negative Negative, Trace, Small (1+), Moderate (2+), Large (3+), 4+   Urobilinogen, UA     Nitrite, UA neg    Leukocytes, UA Small (1+) (A) Negative   Appearance     Odor      Assessment & Plan:  1) High-risk pregnancy G1P0 at [redacted]w[redacted]d with an Estimated Date of Delivery: 07/23/20   2) CHTN, stable Treatment plan  Continue twice weekly testing.   Meds: No orders of the defined types were placed in this encounter.   Labs/procedures today: none   Reviewed: Preterm labor symptoms and general obstetric precautions including but not limited to vaginal bleeding, contractions, leaking of fluid and fetal movement were reviewed in detail with the patient.  All questions were answered. Has home bp cuff. Check bp weekly, let us know if >140/90.   Follow-up: No follow-ups on file.  Future Appointments  Date Time Provider [place]  06/16/2020 10:30 AM [facility] NURSE [facility]  06/23/2020 10:30 AM [facility] NURSE [facility]  06/26/2020  8:30 AM [facility] - [facility]YN [name] [facility] None  06/26/2020  9:30 AM Christin Fudge, CNM [facility]  06/30/2020 10:30 AM [facility] NURSE [facility]  07/03/2020  8:30 AM [facility] - [facility] [name] [facility] None  07/03/2020  9:30 AM Florian Buff, MD [facility]  07/07/2020 10:30 AM [facility] NURSE [facility]  07/10/2020  8:30 AM [facility] - Z[place] Bills  [name] [facility]-FT[facility]G None  07/10/2020  9:30 AM Christin Fudge, CNM [facility]  07/14/2020 10:30 AM [facility] NURSE [facility]  07/21/2020 10:30 AM [facility] NURSE [facility]    Orders Placed This Encounter  Procedures   POC Urinalysis Dipstick OB   Christin Fudge DNP, CNM 06/12/2020 9:42 AM

## 2020-06-16 ENCOUNTER — Ambulatory Visit (INDEPENDENT_AMBULATORY_CARE_PROVIDER_SITE_OTHER): Admitting: *Deleted

## 2020-06-16 ENCOUNTER — Other Ambulatory Visit: Payer: Self-pay

## 2020-06-16 VITALS — BP 118/74 | HR 92 | Wt 164.0 lb

## 2020-06-16 DIAGNOSIS — Z1389 Encounter for screening for other disorder: Secondary | ICD-10-CM

## 2020-06-16 DIAGNOSIS — O10919 Unspecified pre-existing hypertension complicating pregnancy, unspecified trimester: Secondary | ICD-10-CM

## 2020-06-16 DIAGNOSIS — Z3A34 34 weeks gestation of pregnancy: Secondary | ICD-10-CM

## 2020-06-16 DIAGNOSIS — O0993 Supervision of high risk pregnancy, unspecified, third trimester: Secondary | ICD-10-CM

## 2020-06-16 DIAGNOSIS — Z331 Pregnant state, incidental: Secondary | ICD-10-CM | POA: Diagnosis not present

## 2020-06-16 DIAGNOSIS — O09513 Supervision of elderly primigravida, third trimester: Secondary | ICD-10-CM

## 2020-06-16 LAB — POCT URINALYSIS DIPSTICK OB
Glucose, UA: NEGATIVE
Ketones, UA: NEGATIVE
Nitrite, UA: NEGATIVE

## 2020-06-16 NOTE — Progress Notes (Addendum)
   NURSE VISIT- NST  SUBJECTIVE:  Gabriella Schmidt is a 39 y.o. G1P0 female at [redacted]w[redacted]d, here for a NST for pregnancy complicated by [facility].  She reports active fetal movement, contractions: none, vaginal bleeding: none, membranes: intact.   OBJECTIVE:  BP 118/74   Pulse 92   Wt 164 lb (74.4 kg)   LMP 10/05/2019   BMI 27.29 kg/m   Appears well, no apparent distress  Results for orders placed or performed in visit on 06/16/20 (from the past 24 hour(s))  POC Urinalysis Dipstick OB   Collection Time: 06/16/20 10:35 AM  Result Value Ref Range   Color, UA     Clarity, UA     Glucose, UA Negative Negative   Bilirubin, UA     Ketones, UA neg    Spec Grav, UA     Blood, UA 2+    pH, UA     POC,PROTEIN,UA Trace Negative, Trace, Small (1+), Moderate (2+), Large (3+), 4+   Urobilinogen, UA     Nitrite, UA neg    Leukocytes, UA Moderate (2+) (A) Negative   Appearance     Odor      NST: FHR baseline 130 bpm, Variability: moderate, Accelerations:present, Decelerations:  Absent= Cat 1/Reactive Toco: none   ASSESSMENT: G1P0 at [redacted]w[redacted]d with CHTN NST reactive  PLAN: EFM strip reviewed by Knute Neu, CNM, [facility]   Recommendations: keep next appointment as scheduled    Alice Rieger  06/16/2020 2:10 PM  Chart reviewed for nurse visit. Agree with plan of care.  Roma Schanz, [place] 06/16/2020 2:22 PM

## 2020-06-19 ENCOUNTER — Ambulatory Visit (INDEPENDENT_AMBULATORY_CARE_PROVIDER_SITE_OTHER): Admitting: Advanced Practice Midwife

## 2020-06-19 ENCOUNTER — Other Ambulatory Visit: Payer: Self-pay

## 2020-06-19 VITALS — BP 124/84 | HR 84 | Wt 165.0 lb

## 2020-06-19 DIAGNOSIS — O09513 Supervision of elderly primigravida, third trimester: Secondary | ICD-10-CM

## 2020-06-19 DIAGNOSIS — Z3A35 35 weeks gestation of pregnancy: Secondary | ICD-10-CM | POA: Diagnosis not present

## 2020-06-19 DIAGNOSIS — Z1389 Encounter for screening for other disorder: Secondary | ICD-10-CM | POA: Diagnosis not present

## 2020-06-19 DIAGNOSIS — O10913 Unspecified pre-existing hypertension complicating pregnancy, third trimester: Secondary | ICD-10-CM

## 2020-06-19 DIAGNOSIS — O10919 Unspecified pre-existing hypertension complicating pregnancy, unspecified trimester: Secondary | ICD-10-CM

## 2020-06-19 DIAGNOSIS — O0993 Supervision of high risk pregnancy, unspecified, third trimester: Secondary | ICD-10-CM

## 2020-06-19 DIAGNOSIS — Z331 Pregnant state, incidental: Secondary | ICD-10-CM

## 2020-06-19 LAB — POCT URINALYSIS DIPSTICK OB
Blood, UA: NEGATIVE
Glucose, UA: NEGATIVE
Ketones, UA: NEGATIVE
Nitrite, UA: NEGATIVE
POC,PROTEIN,UA: NEGATIVE

## 2020-06-19 NOTE — Progress Notes (Signed)
HIGH-RISK PREGNANCY VISIT Patient name: Gabriella Schmidt MRN [id number]  Date of birth: [birth year] Chief Complaint:   Routine Prenatal Visit (NST)  History of Present Illness:   Gabriella Schmidt is a 39 y.o. G1P0 female at [redacted]w[redacted]d with an Estimated Date of Delivery: 07/23/20 being seen today for ongoing management of a high-risk pregnancy complicated by [facility] currently on no meds Today she reports no complaints. Contractions: Irregular. Vag. Bleeding: None.  Movement: Present. denies leaking of fluid.  Review of Systems:   Pertinent items are noted in HPI Denies abnormal vaginal discharge w/ itching/odor/irritation, headaches, visual changes, shortness of breath, chest pain, abdominal pain, severe nausea/vomiting, or problems with urination or bowel movements unless otherwise stated above. Pertinent History Reviewed:  Reviewed past medical,surgical, social, obstetrical and family history.  Reviewed problem list, medications and allergies. Physical Assessment:   Vitals:   06/19/20 1431  BP: 124/84  Pulse: 84  Weight: 165 lb (74.8 kg)  Body mass index is 27.46 kg/m.           Physical Examination:   General appearance: alert, well appearing, and in no distress  Mental status: alert, oriented to person, place, and time  Skin: warm & dry   Extremities: Edema: Trace    Cardiovascular: normal heart rate noted  Respiratory: normal respiratory effort, no distress  Abdomen: gravid, soft, non-tender  Pelvic: Cervical exam deferred         Fetal Status:     Movement: Present    Fetal Surveillance Testing today: NST: FHR baseline 145 bpm, Variability: moderate, Accelerations:present, Decelerations:  Absent= Cat 1/Reactive    Results for orders placed or performed in visit on 06/19/20 (from the past 24 hour(s))  POC Urinalysis Dipstick OB   Collection Time: 06/19/20  2:35 PM  Result Value Ref Range   Color, UA     Clarity, UA     Glucose, UA Negative Negative   Bilirubin, UA     Ketones, UA  neg    Spec Grav, UA     Blood, UA neg    pH, UA     POC,PROTEIN,UA Negative Negative, Trace, Small (1+), Moderate (2+), Large (3+), 4+   Urobilinogen, UA     Nitrite, UA neg    Leukocytes, UA Small (1+) (A) Negative   Appearance     Odor      Assessment & Plan:  1) High-risk pregnancy G1P0 at 100w1d with an Estimated Date of Delivery: 07/23/20   2) CHTN, no mbed, stable Treatment plan  Continue twice weekly testing.   ,   Treatment plan  Meds: No orders of the defined types were placed in this encounter.   Labs/procedures today: NST   Reviewed: Preterm labor symptoms and general obstetric precautions including but not limited to vaginal bleeding, contractions, leaking of fluid and fetal movement were reviewed in detail with the patient.  All questions were answered. Has home bp cuff. Check bp weekly, let us know if >140/90.   Follow-up: No follow-ups on file.  Future Appointments  Date Time Provider [place]  06/23/2020 10:30 AM [facility] NURSE [facility]  06/26/2020  8:30 AM [facility] [name] [facility] None  06/26/2020  9:30 AM Christin Fudge, CNM [facility]  06/30/2020 10:30 AM [facility] NURSE [facility]  07/03/2020  8:30 AM [facility] [name] [facility] None  07/03/2020  9:30 AM Florian Buff, MD [facility]  07/07/2020 10:30 AM [facility] NURSE [facility]  07/10/2020  8:30 AM [facility] Zetta Bills  [name] [facility] None  07/10/2020  9:30 AM Christin Fudge, CNM [facility]  07/14/2020 10:30 AM [facility] NURSE [facility]  07/21/2020 10:30 AM [facility] NURSE [facility]    Orders Placed This Encounter  Procedures  . POC Urinalysis Dipstick OB   Christin Fudge DNP, CNM 06/19/2020 3:37 PM

## 2020-06-23 ENCOUNTER — Ambulatory Visit (INDEPENDENT_AMBULATORY_CARE_PROVIDER_SITE_OTHER): Admitting: *Deleted

## 2020-06-23 ENCOUNTER — Encounter: Payer: Self-pay | Admitting: *Deleted

## 2020-06-23 VITALS — BP 116/77 | HR 84 | Wt 166.0 lb

## 2020-06-23 DIAGNOSIS — O0993 Supervision of high risk pregnancy, unspecified, third trimester: Secondary | ICD-10-CM

## 2020-06-23 DIAGNOSIS — O10919 Unspecified pre-existing hypertension complicating pregnancy, unspecified trimester: Secondary | ICD-10-CM

## 2020-06-23 DIAGNOSIS — O09513 Supervision of elderly primigravida, third trimester: Secondary | ICD-10-CM

## 2020-06-23 DIAGNOSIS — Z3A35 35 weeks gestation of pregnancy: Secondary | ICD-10-CM | POA: Diagnosis not present

## 2020-06-23 DIAGNOSIS — Z1389 Encounter for screening for other disorder: Secondary | ICD-10-CM | POA: Diagnosis not present

## 2020-06-23 DIAGNOSIS — Z331 Pregnant state, incidental: Secondary | ICD-10-CM | POA: Diagnosis not present

## 2020-06-23 LAB — POCT URINALYSIS DIPSTICK OB
Glucose, UA: NEGATIVE
Ketones, UA: NEGATIVE
Leukocytes, UA: NEGATIVE
Nitrite, UA: NEGATIVE

## 2020-06-23 NOTE — Progress Notes (Addendum)
   NURSE VISIT- NST  SUBJECTIVE:  Charline Tozzi is a 39 y.o. G1P0 female at [redacted]w[redacted]d, here for a NST for pregnancy complicated by [facility].  She reports active fetal movement, contractions: irregular, vaginal bleeding: none, membranes: intact.   OBJECTIVE:  BP 116/77   Pulse 84   Wt 166 lb (75.3 kg)   LMP 10/05/2019   BMI 27.62 kg/m   Appears well, no apparent distress  Results for orders placed or performed in visit on 06/23/20 (from the past 24 hour(s))  POC Urinalysis Dipstick OB   Collection Time: 06/23/20 11:46 AM  Result Value Ref Range   Color, UA     Clarity, UA     Glucose, UA Negative Negative   Bilirubin, UA     Ketones, UA neg    Spec Grav, UA     Blood, UA trace    pH, UA     POC,PROTEIN,UA Trace Negative, Trace, Small (1+), Moderate (2+), Large (3+), 4+   Urobilinogen, UA     Nitrite, UA neg    Leukocytes, UA Negative Negative   Appearance     Odor      NST: FHR baseline 130 bpm, Variability: moderate, Accelerations:present, Decelerations:  Absent= Cat 1/Reactive Toco: none   ASSESSMENT: G1P0 at [redacted]w[redacted]d with CHTN NST reactive  PLAN: EFM strip reviewed by Knute Neu, CNM, [facility]   Recommendations: keep next appointment as scheduled    Alice Rieger  06/23/2020 11:47 AM   Chart reviewed for nurse visit. Agree with plan of care.  Roma Schanz, [place] 06/24/2020 10:21 AM

## 2020-06-25 ENCOUNTER — Other Ambulatory Visit: Payer: Self-pay | Admitting: Advanced Practice Midwife

## 2020-06-25 ENCOUNTER — Telehealth: Payer: Self-pay | Admitting: *Deleted

## 2020-06-25 DIAGNOSIS — O10919 Unspecified pre-existing hypertension complicating pregnancy, unspecified trimester: Secondary | ICD-10-CM

## 2020-06-25 NOTE — Telephone Encounter (Signed)
Patient called stating that she has had some bleeding since 2am.  Had intercourse at 1:30 this morning but has never had bleeding afterwards. States she did also notice some snotty mucous along with the blood but is still having some bleeding. Denies abdominal pain or abnormal uterine tenderness and baby is "very active".  Advised patient to come into office for provider to perform a pelvic exam to assess bleeding but patient stated she wanted to wait until her appointment tomorrow.  Advised if bleeding increased, she developed any abdominal tenderness or change in fetal movement to call our office to be seen.  Pt verbalized understanding and agreeable to plan.

## 2020-06-26 ENCOUNTER — Other Ambulatory Visit: Payer: Self-pay

## 2020-06-26 ENCOUNTER — Other Ambulatory Visit (HOSPITAL_COMMUNITY)
Admission: RE | Admit: 2020-06-26 | Discharge: 2020-06-26 | Disposition: A | Discharge status: Home / Self Care | Source: Ambulatory Visit | Attending: Advanced Practice Midwife | Admitting: Advanced Practice Midwife

## 2020-06-26 ENCOUNTER — Encounter: Payer: Self-pay | Admitting: Advanced Practice Midwife

## 2020-06-26 ENCOUNTER — Ambulatory Visit (INDEPENDENT_AMBULATORY_CARE_PROVIDER_SITE_OTHER): Admitting: Advanced Practice Midwife

## 2020-06-26 ENCOUNTER — Ambulatory Visit (INDEPENDENT_AMBULATORY_CARE_PROVIDER_SITE_OTHER)

## 2020-06-26 VITALS — BP 127/79 | HR 98 | Wt 168.0 lb

## 2020-06-26 DIAGNOSIS — R3121 Asymptomatic microscopic hematuria: Secondary | ICD-10-CM

## 2020-06-26 DIAGNOSIS — Z3A36 36 weeks gestation of pregnancy: Secondary | ICD-10-CM

## 2020-06-26 DIAGNOSIS — O10919 Unspecified pre-existing hypertension complicating pregnancy, unspecified trimester: Secondary | ICD-10-CM

## 2020-06-26 DIAGNOSIS — O10913 Unspecified pre-existing hypertension complicating pregnancy, third trimester: Secondary | ICD-10-CM

## 2020-06-26 DIAGNOSIS — O09513 Supervision of elderly primigravida, third trimester: Secondary | ICD-10-CM

## 2020-06-26 DIAGNOSIS — Z1389 Encounter for screening for other disorder: Secondary | ICD-10-CM

## 2020-06-26 DIAGNOSIS — O0993 Supervision of high risk pregnancy, unspecified, third trimester: Secondary | ICD-10-CM | POA: Diagnosis present

## 2020-06-26 DIAGNOSIS — Z331 Pregnant state, incidental: Secondary | ICD-10-CM

## 2020-06-26 LAB — POCT URINALYSIS DIPSTICK OB
Glucose, UA: NEGATIVE
Ketones, UA: NEGATIVE
Nitrite, UA: NEGATIVE
POC,PROTEIN,UA: NEGATIVE

## 2020-06-26 NOTE — Progress Notes (Signed)
[name] 68+3 wks,cephalic,BPP 4/1,DQQ 229 bpm,AFI 12 cm,posterior placenta gr 2,mild left renal pelvic dilation 8.2 mm,right renal pelvis 6.4 mm WNL,EFW 2946 g 61%,limited head measurement because of fetal position

## 2020-06-26 NOTE — Progress Notes (Signed)
[place] PREGNANCY VISIT Patient name: Gabriella Schmidt MRN [id number]  Date of birth: 1980/12/02 Chief Complaint:   High Risk Gestation ([name] today; GBS, GC/CHL)  History of Present Illness:   Jream Procida is a 39 y.o. G1P0 female at [redacted]w[redacted]d with an Estimated Date of Delivery: 07/23/20 being seen today for ongoing management of a high-risk pregnancy complicated by [facility] currently on no meds Today she reports pelvic pressure, not feeling ctx.  Had some bloody show last night. No UTI sx . Contractions: Irregular. Vag. Bleeding: Scant.  Movement: Present. denies leaking of fluid.  Review of Systems:   Pertinent items are noted in HPI Denies abnormal vaginal discharge w/ itching/odor/irritation, headaches, visual changes, shortness of breath, chest pain, abdominal pain, severe nausea/vomiting, or problems with urination or bowel movements unless otherwise stated above. Pertinent History Reviewed:  Reviewed past medical,surgical, social, obstetrical and family history.  Reviewed problem list, medications and allergies. Physical Assessment:   Vitals:   06/26/20 0922  BP: 127/79  Pulse: 98  Weight: 168 lb (76.2 kg)  Body mass index is 27.96 kg/m.           Physical Examination:   General appearance: alert, well appearing, and in no distress  Mental status: alert, oriented to person, place, and time  Skin: warm & dry   Extremities: Edema: Trace    Cardiovascular: normal heart rate noted  Respiratory: normal respiratory effort, no distress  Abdomen: gravid, soft, non-tender  Pelvic: Cervical exam performed  Dilation: 4 Effacement (%): 80 Station: 0  Fetal Status: Fetal Heart Rate (bpm): [name]   Movement: Present Presentation: Vertex  Fetal Surveillance Testing todUS 87+6 wks,cephalic,BPP 8/1,LXB 262 bpm,AFI 12 cm,posterior placenta gr 2,mild left renal pelvic dilation 8.2 mm,right renal pelvis 6.4 mm WNL,EFW 2946 g 61%,limited head measurement because of fetal position  NST: FHR baseline 145  bpm, Variability: moderate, Accelerations:present, Decelerations:  Absent= Cat 1/Reactive  looks like UI on EFM. PT feeling tightening.  No change in cx.    Results for orders placed or performed in visit on 06/26/20 (from the past 24 hour(s))  POC Urinalysis Dipstick OB   Collection Time: 06/26/20  9:23 AM  Result Value Ref Range   Color, UA     Clarity, UA     Glucose, UA Negative Negative   Bilirubin, UA     Ketones, UA neg    Spec Grav, UA     Blood, UA 2+    pH, UA     POC,PROTEIN,UA Negative Negative, Trace, Small (1+), Moderate (2+), Large (3+), 4+   Urobilinogen, UA     Nitrite, UA neg    Leukocytes, UA Moderate (2+) (A) Negative   Appearance     Odor      Assessment & Plan:  1) High-risk pregnancy G1P0 at [redacted]w[redacted]d with an Estimated Date of Delivery: 07/23/20   2) CHTN, stable Treatment plan  Continue twice weekly testing, IOL 39 weeks    Meds: No orders of the defined types were placed in this encounter.   Labs/procedures today: GBS/GC/CHL  No change in cx after 1 hour. Labor precautions given.  Reviewed:  labor symptoms and general obstetric precautions including but not limited to vaginal bleeding, contractions, leaking of fluid and fetal movement were reviewed in detail with the patient.  All questions were answered. Has home bp cuff.. Check bp weekly, let us know if >140/90.   Follow-up: No follow-ups on file.  Future Appointments  Date Time Provider [place]  06/30/2020 10:30  AM [facility] NURSE [facility]  07/03/2020  8:30 AM [facility] [name] [facility] None  07/03/2020  9:30 AM Florian Buff, MD [facility]  07/07/2020 10:30 AM [facility] NURSE [facility]  07/10/2020  8:30 AM [facility] [name] [facility] None  07/10/2020  9:30 AM Christin Fudge, CNM [facility]  07/14/2020 10:30 AM [facility] NURSE [facility]  07/21/2020 10:30 AM [facility] NURSE [facility]    Orders Placed This Encounter  Procedures  . Strep  Gp B NAA  . Urine Culture  . POC Urinalysis Dipstick OB   Christin Fudge DNP, CNM 06/26/2020 11:02 AM

## 2020-06-26 NOTE — Patient Instructions (Signed)

## 2020-06-27 LAB — CERVICOVAGINAL ANCILLARY ONLY
Chlamydia: NEGATIVE
Comment: NEGATIVE
Comment: NORMAL
Neisseria Gonorrhea: NEGATIVE

## 2020-06-28 ENCOUNTER — Other Ambulatory Visit: Payer: Self-pay

## 2020-06-28 ENCOUNTER — Inpatient Hospital Stay (HOSPITAL_COMMUNITY)
Admission: AD | Admit: 2020-06-28 | Discharge: 2020-06-30 | DRG: 797 | Disposition: A | Discharge status: Home / Self Care | Attending: Obstetrics and Gynecology | Admitting: Obstetrics and Gynecology

## 2020-06-28 ENCOUNTER — Inpatient Hospital Stay (HOSPITAL_COMMUNITY): Admitting: Anesthesiology

## 2020-06-28 ENCOUNTER — Encounter (HOSPITAL_COMMUNITY): Payer: Self-pay | Admitting: Obstetrics and Gynecology

## 2020-06-28 DIAGNOSIS — O09513 Supervision of elderly primigravida, third trimester: Secondary | ICD-10-CM

## 2020-06-28 DIAGNOSIS — Z87891 Personal history of nicotine dependence: Secondary | ICD-10-CM

## 2020-06-28 DIAGNOSIS — O1002 Pre-existing essential hypertension complicating childbirth: Secondary | ICD-10-CM | POA: Diagnosis present

## 2020-06-28 DIAGNOSIS — O42913 Preterm premature rupture of membranes, unspecified as to length of time between rupture and onset of labor, third trimester: Secondary | ICD-10-CM | POA: Diagnosis present

## 2020-06-28 DIAGNOSIS — Z302 Encounter for sterilization: Secondary | ICD-10-CM

## 2020-06-28 DIAGNOSIS — O0993 Supervision of high risk pregnancy, unspecified, third trimester: Secondary | ICD-10-CM

## 2020-06-28 DIAGNOSIS — Z20822 Contact with and (suspected) exposure to covid-19: Secondary | ICD-10-CM | POA: Diagnosis present

## 2020-06-28 DIAGNOSIS — Z3A36 36 weeks gestation of pregnancy: Secondary | ICD-10-CM

## 2020-06-28 DIAGNOSIS — O26893 Other specified pregnancy related conditions, third trimester: Secondary | ICD-10-CM | POA: Diagnosis present

## 2020-06-28 DIAGNOSIS — Z91040 Latex allergy status: Secondary | ICD-10-CM | POA: Diagnosis not present

## 2020-06-28 DIAGNOSIS — O10919 Unspecified pre-existing hypertension complicating pregnancy, unspecified trimester: Secondary | ICD-10-CM

## 2020-06-28 LAB — CBC
HCT: 36.4 % (ref 36.0–46.0)
Hemoglobin: 11.6 g/dL — ABNORMAL LOW (ref 12.0–15.0)
MCH: 26.9 pg (ref 26.0–34.0)
MCHC: 31.9 g/dL (ref 30.0–36.0)
MCV: 84.3 fL (ref 80.0–100.0)
Platelets: 282 10*3/uL (ref 150–400)
RBC: 4.32 MIL/uL (ref 3.87–5.11)
RDW: 13.5 % (ref 11.5–15.5)
WBC: 11.3 10*3/uL — ABNORMAL HIGH (ref 4.0–10.5)
nRBC: 0 % (ref 0.0–0.2)

## 2020-06-28 LAB — TYPE AND SCREEN
ABO/RH(D): O POS
Antibody Screen: NEGATIVE

## 2020-06-28 LAB — RESPIRATORY PANEL BY RT PCR (FLU A&B, COVID)
Influenza A by PCR: NEGATIVE
Influenza B by PCR: NEGATIVE
SARS Coronavirus 2 by RT PCR: NEGATIVE

## 2020-06-28 LAB — RPR: RPR Ser Ql: NONREACTIVE

## 2020-06-28 LAB — POCT FERN TEST: POCT Fern Test: POSITIVE

## 2020-06-28 LAB — STREP GP B NAA: Strep Gp B NAA: NEGATIVE

## 2020-06-28 MED ORDER — ONDANSETRON HCL 4 MG PO TABS: 4.0000 mg | ORAL_TABLET | ORAL | Status: DC | PRN

## 2020-06-28 MED ORDER — FLEET ENEMA 7-19 GM/118ML RE ENEM: 1.0000 | ENEMA | RECTAL | Status: DC | PRN

## 2020-06-28 MED ORDER — SENNOSIDES-DOCUSATE SODIUM 8.6-50 MG PO TABS
2.0000 | ORAL_TABLET | ORAL | Status: DC
  Administered 2020-06-29 – 2020-06-30 (×2): 2 via ORAL
  Filled 2020-06-28 (×2): qty 2

## 2020-06-28 MED ORDER — PRENATAL MULTIVITAMIN CH
1.0000 | ORAL_TABLET | Freq: Every day | ORAL | Status: DC
  Administered 2020-06-30: 1 via ORAL
  Filled 2020-06-28: qty 1

## 2020-06-28 MED ORDER — PHENYLEPHRINE 40 MCG/ML (10ML) SYRINGE FOR IV PUSH (FOR BLOOD PRESSURE SUPPORT): 80.0000 ug | PREFILLED_SYRINGE | INTRAVENOUS | Status: DC | PRN

## 2020-06-28 MED ORDER — EPHEDRINE 5 MG/ML INJ: 10.0000 mg | INTRAVENOUS | Status: DC | PRN

## 2020-06-28 MED ORDER — MEASLES, MUMPS & RUBELLA VAC IJ SOLR: 0.5000 mL | Freq: Once | INTRAMUSCULAR | Status: DC

## 2020-06-28 MED ORDER — OXYCODONE-ACETAMINOPHEN 5-325 MG PO TABS: 2.0000 | ORAL_TABLET | ORAL | Status: DC | PRN

## 2020-06-28 MED ORDER — TERBUTALINE SULFATE 1 MG/ML IJ SOLN: 0.2500 mg | Freq: Once | INTRAMUSCULAR | Status: DC | PRN

## 2020-06-28 MED ORDER — BETAMETHASONE SOD PHOS & ACET 6 (3-3) MG/ML IJ SUSP
12.0000 mg | Freq: Once | INTRAMUSCULAR | Status: AC
  Administered 2020-06-28: 12 mg via INTRAMUSCULAR
  Filled 2020-06-28: qty 5

## 2020-06-28 MED ORDER — LIDOCAINE HCL (PF) 1 % IJ SOLN
INTRAMUSCULAR | Status: DC | PRN
  Administered 2020-06-28: 8 mL via EPIDURAL
  Administered 2020-06-28: 5 mL via EPIDURAL

## 2020-06-28 MED ORDER — OXYCODONE-ACETAMINOPHEN 5-325 MG PO TABS: 1.0000 | ORAL_TABLET | ORAL | Status: DC | PRN

## 2020-06-28 MED ORDER — DIPHENHYDRAMINE HCL 50 MG/ML IJ SOLN: 12.5000 mg | INTRAMUSCULAR | Status: DC | PRN

## 2020-06-28 MED ORDER — OXYTOCIN-SODIUM CHLORIDE 30-0.9 UT/500ML-% IV SOLN
2.5000 [IU]/h | INTRAVENOUS | Status: DC
  Filled 2020-06-28: qty 500

## 2020-06-28 MED ORDER — SODIUM CHLORIDE 0.9 % IV SOLN
5.0000 10*6.[IU] | Freq: Once | INTRAVENOUS | Status: AC
  Administered 2020-06-28: 5 10*6.[IU] via INTRAVENOUS
  Filled 2020-06-28: qty 5

## 2020-06-28 MED ORDER — LACTATED RINGERS IV SOLN: 500.0000 mL | INTRAVENOUS | Status: DC | PRN

## 2020-06-28 MED ORDER — DIPHENHYDRAMINE HCL 25 MG PO CAPS: 25.0000 mg | ORAL_CAPSULE | Freq: Four times a day (QID) | ORAL | Status: DC | PRN

## 2020-06-28 MED ORDER — METOCLOPRAMIDE HCL 10 MG PO TABS
10.0000 mg | ORAL_TABLET | Freq: Once | ORAL | Status: AC
  Administered 2020-06-29: 10 mg via ORAL
  Filled 2020-06-28 (×2): qty 1

## 2020-06-28 MED ORDER — FAMOTIDINE 20 MG PO TABS
40.0000 mg | ORAL_TABLET | Freq: Once | ORAL | Status: AC
  Administered 2020-06-29: 40 mg via ORAL
  Filled 2020-06-28: qty 2

## 2020-06-28 MED ORDER — BENZOCAINE-MENTHOL 20-0.5 % EX AERO
1.0000 "application " | INHALATION_SPRAY | CUTANEOUS | Status: DC | PRN
  Administered 2020-06-29: 1 via TOPICAL
  Filled 2020-06-28: qty 56

## 2020-06-28 MED ORDER — TETANUS-DIPHTH-ACELL PERTUSSIS 5-2.5-18.5 LF-MCG/0.5 IM SUSP: 0.5000 mL | Freq: Once | INTRAMUSCULAR | Status: DC

## 2020-06-28 MED ORDER — BUPIVACAINE HCL (PF) 0.75 % IJ SOLN
INTRAMUSCULAR | Status: DC | PRN
Start: 2020-06-28 — End: 2020-06-28
  Administered 2020-06-28: 12 mL/h via EPIDURAL

## 2020-06-28 MED ORDER — DIBUCAINE (PERIANAL) 1 % EX OINT
1.0000 "application " | TOPICAL_OINTMENT | CUTANEOUS | Status: DC | PRN
  Filled 2020-06-28: qty 28

## 2020-06-28 MED ORDER — ACETAMINOPHEN 325 MG PO TABS
650.0000 mg | ORAL_TABLET | ORAL | Status: DC | PRN
  Administered 2020-06-29 – 2020-06-30 (×3): 650 mg via ORAL
  Filled 2020-06-28 (×3): qty 2

## 2020-06-28 MED ORDER — LIDOCAINE HCL (PF) 1 % IJ SOLN: 30.0000 mL | INTRAMUSCULAR | Status: DC | PRN

## 2020-06-28 MED ORDER — IBUPROFEN 600 MG PO TABS
600.0000 mg | ORAL_TABLET | Freq: Four times a day (QID) | ORAL | Status: DC
  Administered 2020-06-29 – 2020-06-30 (×6): 600 mg via ORAL
  Filled 2020-06-28 (×6): qty 1

## 2020-06-28 MED ORDER — OXYTOCIN-SODIUM CHLORIDE 30-0.9 UT/500ML-% IV SOLN
1.0000 m[IU]/min | INTRAVENOUS | Status: DC
  Administered 2020-06-28: 2 m[IU]/min via INTRAVENOUS

## 2020-06-28 MED ORDER — ONDANSETRON HCL 4 MG/2ML IJ SOLN: 4.0000 mg | Freq: Four times a day (QID) | INTRAMUSCULAR | Status: DC | PRN

## 2020-06-28 MED ORDER — LACTATED RINGERS IV SOLN: INTRAVENOUS | Status: DC

## 2020-06-28 MED ORDER — FENTANYL-BUPIVACAINE-NACL 0.5-0.125-0.9 MG/250ML-% EP SOLN
12.0000 mL/h | EPIDURAL | Status: DC | PRN
  Filled 2020-06-28: qty 250

## 2020-06-28 MED ORDER — LACTATED RINGERS IV SOLN: 500.0000 mL | Freq: Once | INTRAVENOUS | Status: DC

## 2020-06-28 MED ORDER — OXYTOCIN BOLUS FROM INFUSION
333.0000 mL | Freq: Once | INTRAVENOUS | Status: AC
  Administered 2020-06-28: 333 mL via INTRAVENOUS

## 2020-06-28 MED ORDER — SOD CITRATE-CITRIC ACID 500-334 MG/5ML PO SOLN: 30.0000 mL | ORAL | Status: DC | PRN

## 2020-06-28 MED ORDER — SIMETHICONE 80 MG PO CHEW: 80.0000 mg | CHEWABLE_TABLET | ORAL | Status: DC | PRN

## 2020-06-28 MED ORDER — COCONUT OIL OIL: 1.0000 "application " | TOPICAL_OIL | Status: DC | PRN

## 2020-06-28 MED ORDER — ONDANSETRON HCL 4 MG/2ML IJ SOLN: 4.0000 mg | INTRAMUSCULAR | Status: DC | PRN

## 2020-06-28 MED ORDER — WITCH HAZEL-GLYCERIN EX PADS
1.0000 "application " | MEDICATED_PAD | CUTANEOUS | Status: DC | PRN
  Administered 2020-06-29: 1 via TOPICAL

## 2020-06-28 MED ORDER — ACETAMINOPHEN 325 MG PO TABS
650.0000 mg | ORAL_TABLET | ORAL | Status: DC | PRN
  Administered 2020-06-28: 650 mg via ORAL
  Filled 2020-06-28: qty 2

## 2020-06-28 MED ORDER — PENICILLIN G POT IN DEXTROSE 60000 UNIT/ML IV SOLN
3.0000 10*6.[IU] | INTRAVENOUS | Status: DC
  Administered 2020-06-28 (×2): 3 10*6.[IU] via INTRAVENOUS
  Filled 2020-06-28 (×4): qty 50

## 2020-06-28 NOTE — Progress Notes (Signed)
Labor Progress Note  Subjective:  Doing well, feeling sleepy   Objective:  BP 114/63   Pulse 82   Temp 98.7 F (37.1 C) (Oral)   Resp 16   Ht 5\' 5"  (1.651 m)   Wt 76.6 kg   LMP 10/05/2019   SpO2 99%   BMI 28.09 kg/m  Gen: Lying in bed comfortably. NAD.  Extremities: No signs of DVT.   CE: Dilation: 6 Effacement (%): 80 Cervical Position: Middle Station: 0 Presentation: Vertex Exam by:: West Pugh, RN & Catie Conley Canal, RN Contractions: irregular FH: BL 120, moderate var, - a, none decels.   Assessment and Plan:  Gabriella Schmidt is a 39 y.o. G1P0 at [redacted]w[redacted]d admitted for PPROM.   Labor:  PPROM. S/p BMZ. Per patient, contractions have been strong prior to epidural.  Expectant management for now. Pitocin if decreased contractions.  . Pain control: Epidural . Anticipated MOD: NSVD  Fetal Wellbeing: Category I . GBS unknown- PCN  . Continuous fetal monitoring  CHTN BP: (114-147)/(63-99) 114/63 (10/09 1131)  PRN antihypertensives for increased pressures.   Post partum planning  Rubella noninmmune   Zettie Cooley, M.D.  FM PGY 3 06/28/2020 1:14 PM

## 2020-06-28 NOTE — H&P (Signed)
Gabriella Schmidt is a 39 y.o. female, G1P0 at 36.3 weeks, presenting for SROM that occurred at 5.  She states her last cervical exam she was 4cm.  [facility] receives care at [facility] and was supervised for a high risk pregnancy. Pregnancy and medical history significant for problems as listed below. Her GBS is unknown and expresses a desire for epidural for pain management.  She is anticipating a female infant and requests BTL for PP birth control method.     Patient Active Problem List   Diagnosis Date Noted  . Normal labor 06/28/2020  . Supervision of high-risk pregnancy 01/31/2020  . Chronic hypertension affecting pregnancy 01/31/2020  . AMA (advanced maternal age) primigravida 35+ 01/31/2020  . Anxiety 01/31/2020    History of present pregnancy:  Last evaluation:  June 26, 2020 in office by Nigel Berthold, CNM. BPP 8/8, SVE: 4/80/0. NST Reactive. US revealed mild left renal pelvic dilation 8.53mm. EFW 2946 grams.    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    Carrier Screen declined  Flu vaccine  [place]/Hgb Elec neg  TDaP vaccine 04/24/20     Rhogam  Blood Type O/Positive/-- (05/13 1525)    Antibody Negative (05/13 1525)  Anatomy US Normal female 'Gabriella Schmidt' HBsAg Negative (05/13 1525)  Feeding Plan breast RPR Non Reactive (05/13 1525)  Contraception BTL Rubella  <0.90 (05/13 1525)  Circumcision n/a HIV Non Reactive (05/13 1525)  Pediatrician List given Hep C neg  Prenatal Classes discussed      A1C/GTT Early:      26-28wks: normal  BTL Consent     VBAC Consent  GBS        [ ]  PCN allergy  Waterbirth [ ] Class [ ] Consent [ ] CNM visit       OB History    Gravida  1   Para      Term      Preterm      AB      Living  0     SAB      TAB      Ectopic      Multiple      Live Births                 Past Medical History:  Diagnosis Date  . Anxiety   . Degenerative cervical disc   . Hypertension    History reviewed. No pertinent surgical history. Family History: family history includes Cardiomyopathy in her father; Hypertension in her father, maternal grandfather, maternal grandmother, and mother. Social History:  reports that she has quit smoking. She has never used smokeless tobacco. She reports previous alcohol use. She reports that she does not use drugs.   Prenatal Transfer Tool  Maternal Diabetes: No Genetic Screening: Normal Maternal Ultrasounds/Referrals: Other: Fetal Ultrasounds or other Referrals:  None Mild Left Renal Pelvis Dilation Maternal Substance Abuse:  No Significant Maternal Medications:  None Significant Maternal Lab Results: None   Maternal Assessment:  ROS: +Contractions, +LOF, -Vaginal Bleeding, +Fetal Movement  All other systems reviewed and negative.    Allergies  Allergen Reactions  . Latex Rash     Dilation: 4 Effacement (%): 50, 60 Station: -1 Exam by:: [name],  RN Blood pressure 132/87, pulse 86, temperature 98.5 F (36.9 C), temperature source Oral, resp. rate 19, height 5\' 5"  (1.651 m), weight 76.6 kg, last menstrual period 10/05/2019, SpO2 99 %.  Physical Exam Constitutional:      Appearance: Normal appearance.  HENT:     Head: Normocephalic and atraumatic.  Eyes:     Conjunctiva/sclera: Conjunctivae normal.  Cardiovascular:     Rate and Rhythm: Normal rate and regular rhythm.     Heart sounds: Normal heart sounds.  Pulmonary:     Effort: Pulmonary effort is normal. No respiratory distress.     Breath sounds: Normal breath sounds.  Musculoskeletal:        General: Normal range of motion.     Cervical back: Normal range of motion.  Skin:    General: Skin is warm and dry.  Neurological:     Mental Status: She is alert and oriented to person, place, and time.  Psychiatric:        Mood and  Affect: Mood normal.        Behavior: Behavior normal.        Thought Content: Thought content normal.     Fetal Assessment: [place]: -Pelvis: Not evaluated by provider -EFW: 2946g by [name] -Presentation: Vertex by Nurse Exam and 10/7 [name]  FHR: 125 bpm, Mod Var, -Decels, +Accels UCs:  Q1-65min, palpates mild to moderate    Assessment IUP at 36.3 weeks Cat I FT SROM GBS Unknown  Plan: -Admit to [facility]  -Routine Labor and Delivery Orders per Protocol -Start PCN for prophylaxis -Give BMZ Dose now -In room to complete assessment and discuss POC: -Reviewed medications and expectant mgmt at current. -Confirmed desire for BTL -No questions or concerns. -Will continue to monitor   [name], MSN 06/28/2020, 4:39 AM

## 2020-06-28 NOTE — Progress Notes (Signed)
Epidural capped at 1935 on 06/28/2020. Epidural left in place for BTL scheduled for tomorrow 06/29/20.  -Joen Laura, RN

## 2020-06-28 NOTE — Discharge Summary (Signed)
error 

## 2020-06-28 NOTE — Anesthesia Preprocedure Evaluation (Signed)
Anesthesia Evaluation  Patient identified by MRN, date of birth, ID band Patient awake    Reviewed: Allergy & Precautions, H&P , Patient's Chart, lab work & pertinent test results  Airway Mallampati: I       Dental no notable dental hx.    Pulmonary former smoker,    Pulmonary exam normal breath sounds clear to auscultation       Cardiovascular hypertension, negative cardio ROS Normal cardiovascular exam     Neuro/Psych Anxiety negative neurological ROS     GI/Hepatic negative GI ROS, Neg liver ROS,   Endo/Other  negative endocrine ROS  Renal/GU negative Renal ROS     Musculoskeletal   Abdominal Normal abdominal exam  (+)   Peds negative pediatric ROS (+)  Hematology negative hematology ROS (+)   Anesthesia Other Findings   Reproductive/Obstetrics (+) Pregnancy                             Anesthesia Physical Anesthesia Plan  ASA: II  Anesthesia Plan: Epidural   Post-op Pain Management:    Induction:   PONV Risk Score and Plan:   Airway Management Planned:   Additional Equipment: None  Intra-op Plan:   Post-operative Plan:   Informed Consent: I have reviewed the patients History and Physical, chart, labs and discussed the procedure including the risks, benefits and alternatives for the proposed anesthesia with the patient or authorized representative who has indicated his/her understanding and acceptance.       Plan Discussed with:   Anesthesia Plan Comments:         Anesthesia Quick Evaluation

## 2020-06-28 NOTE — Discharge Summary (Signed)
Postpartum Discharge Summary  Date of Service updated 06/30/20     Patient Name: Gabriella Schmidt DOB: 07/14/1981 MRN: [id number]  Date of admission: 06/28/2020 Delivery date:06/28/2020  Delivering provider: CONSTANT, PEGGY  Date of discharge: 06/30/2020  Admitting diagnosis: Normal labor [O80, Z37.9] Intrauterine pregnancy: [redacted]w[redacted]d    Secondary diagnosis:  Active Problems:   Normal labor   Vacuum-assisted vaginal delivery   Request for sterilization  Additional problems: AMA, anxiety, CHTN    Discharge diagnosis: Preterm Pregnancy Delivered                                              Post partum procedures:postpartum tubal ligation Augmentation: Pitocin Complications: None  Hospital course: Onset of Labor With Vaginal Delivery      39y.o. yo G1P0 at 335w3das admitted in Latent Labor on 06/28/2020. Patient had an uncomplicated labor course as follows:  Membrane Rupture Time/Date: 12:00 AM ,06/28/2020   Delivery Method:Vaginal, Vacuum (Extractor)  Episiotomy: None  Lacerations:  2nd degree  Patient had an uncomplicated postpartum course. She underwent a PP BTL on PPD # 1 without problems. See OP note for additional information. She is ambulating, tolerating a regular diet, passing flatus, and urinating well. Patient is discharged home in stable condition on 06/30/20.  Newborn Data: Birth date:06/28/2020  Birth time:7:09 PM  Gender:Female  Living status:Living  Apgars:7 ,9  Weight:2999 g   Magnesium Sulfate received: No BMZ received: Yes Rhophylac:N/A MMR:Yes T-DaP:Given prenatally Flu: No Transfusion:No  Physical exam  Vitals:   06/29/20 1347 06/29/20 1755 06/29/20 2120 06/30/20 0606  BP: (!) 146/69 118/72 133/87 123/69  Pulse: 82 89 84 72  Resp: _0 Temp: 98.6 F (37 C) 98.6 F (37 C) 98.6 F (37 C) 98.3 F (36.8 C)  TempSrc: Oral Oral Oral Oral  SpO2: 100% 100% 100% 100%  Weight:      Height:       General: alert Lochia: appropriate Uterine  Fundus: firm Incision: Healing well with no significant drainage DVT Evaluation: No evidence of DVT seen on physical exam. Labs: Lab Results  Component Value Date   WBC 11.3 (H) 06/28/2020   HGB 11.6 (L) 06/28/2020   HCT 36.4 06/28/2020   MCV 84.3 06/28/2020   PLT 282 06/28/2020   CMP Latest Ref Rng & Units 01/31/2020  Glucose 65 - 99 mg/dL 77  BUN 6 - 20 mg/dL 9  Creatinine 0.57 - 1.00 mg/dL 0.46(L)  Sodium 134 - 144 mmol/L 137  Potassium 3.5 - 5.2 mmol/L 4.8  Chloride 96 - 106 mmol/L 103  CO2 20 - 29 mmol/L 21  Calcium 8.7 - 10.2 mg/dL 9.4  Total Protein 6.0 - 8.5 g/dL 6.9  Total Bilirubin 0.0 - 1.2 mg/dL 0.3  Alkaline Phos 39 - 117 IU/L 85  AST 0 - 40 IU/L 12  ALT 0 - 32 IU/L 10   Edinburgh Score: Edinburgh Postnatal Depression Scale Screening Tool 06/29/2020  I have been able to laugh and see the funny side of things. 0  I have looked forward with enjoyment to things. 0  I have blamed myself unnecessarily when things went wrong. 1  I have been anxious or worried for no good reason. 2  I have felt scared or panicky for no good reason. 1  Things have been getting on top of me. 0  I have been so unhappy that I have had difficulty sleeping. 0  I have felt sad or miserable. 0  I have been so unhappy that I have been crying. 0  The thought of harming myself has occurred to me. 0  Edinburgh Postnatal Depression Scale Total 4     After visit meds:  Allergies as of 06/30/2020      Reactions   Latex Rash      Medication List    STOP taking these medications   aspirin EC 81 MG tablet   BENADRYL PO   hydrOXYzine 25 MG capsule Commonly known as: Vistaril     TAKE these medications   albuterol 108 (90 Base) MCG/ACT inhaler Commonly known as: VENTOLIN HFA Inhale 2 puffs into the lungs every 6 (six) hours as needed for wheezing or shortness of breath.   ibuprofen 600 MG tablet Commonly known as: ADVIL Take 1 tablet (600 mg total) by mouth every 6 (six) hours.    oxyCODONE 5 MG immediate release tablet Commonly known as: Oxy IR/ROXICODONE Take 1 tablet (5 mg total) by mouth every 6 (six) hours as needed for severe pain.   prenatal multivitamin Tabs tablet Take 1 tablet by mouth daily at 12 noon.   TYLENOL PO Take by mouth.        Discharge home in stable condition Infant Feeding: Bottle Infant Disposition:home with mother Discharge instruction: per After Visit Summary and Postpartum booklet. Activity: Advance as tolerated. Pelvic rest for 6 weeks.  Diet: routine diet Future Appointments: No future appointments. Follow up Visit:  Follow-up Information    [facility] Follow up.   Specialty: Obstetrics and Gynecology Why: 1 week for BP cjeck 4 weeks for [facility] visit Contact information: [address] [place] [phone]              Please schedule this patient for a In person postpartum visit in 6 weeks with the following provider: Any provider. Additional Postpartum F/U:BP check 1 week  High risk pregnancy complicated by: HTN Delivery mode:  Vaginal, Vacuum Neurosurgeon)  Anticipated Birth Control:  BTL done [facility]   06/30/2020 Chancy Milroy, MD

## 2020-06-28 NOTE — MAU Note (Signed)
PT SAYS SROM AT 0005- CLEAR FLUID. PNC WITH [facility]. VE - YESTERDAY 4 CM.  DENIES HSV AND MRSA. GBS-  COLLECTED YESTERDAY .  NO UC'S .

## 2020-06-28 NOTE — Progress Notes (Signed)
Patient Vitals for the past 4 hrs:  BP Temp Temp src Pulse Resp  06/28/20 1501 136/73 -- -- 92 18  06/28/20 1430 130/78 -- -- 89 16  06/28/20 1417 -- 98.9 F (37.2 C) Oral -- --  06/28/20 1401 136/72 -- -- 89 18  06/28/20 1330 (!) 118/59 98.5 F (36.9 C) Oral 80 16  06/28/20 1300 125/71 -- -- 78 18  06/28/20 1230 117/66 -- -- 86 16  06/28/20 1200 117/68 -- -- 82 --   Feeling more pressure . FHR Cat1.  Ctx very irregular, q 2-7 minutes  Cx 10/100/+1, moves good w/pushing. Will start pitocin to get better ctx pattern and start pushing.

## 2020-06-28 NOTE — Progress Notes (Signed)
Patient Vitals for the past 4 hrs:  BP Pulse Resp  06/28/20 1330 (!) 118/59 80 --  06/28/20 1300 125/71 78 18  06/28/20 1230 117/66 86 16  06/28/20 1200 117/68 82 --  06/28/20 1131 114/63 82 --  06/28/20 1101 134/80 80 --  06/28/20 1031 124/77 79 16   Feeling some pressure. FHR Cat 1, ctx still irregular.  Cx ant lip/0 station.  Will labor down until pressure is strong.

## 2020-06-29 ENCOUNTER — Encounter (HOSPITAL_COMMUNITY)
Admission: AD | Disposition: A | Discharge status: Home / Self Care | Payer: Self-pay | Source: Home / Self Care | Attending: Obstetrics and Gynecology

## 2020-06-29 ENCOUNTER — Inpatient Hospital Stay (HOSPITAL_COMMUNITY): Admitting: Anesthesiology

## 2020-06-29 ENCOUNTER — Encounter (HOSPITAL_COMMUNITY): Payer: Self-pay | Admitting: Obstetrics and Gynecology

## 2020-06-29 DIAGNOSIS — Z302 Encounter for sterilization: Secondary | ICD-10-CM

## 2020-06-29 HISTORY — PX: TUBAL LIGATION: SHX77

## 2020-06-29 SURGERY — LIGATION, FALLOPIAN TUBE, POSTPARTUM
Anesthesia: Choice

## 2020-06-29 MED ORDER — CHLOROPROCAINE HCL (PF) 3 % IJ SOLN
INTRAMUSCULAR | Status: AC
  Filled 2020-06-29: qty 20

## 2020-06-29 MED ORDER — EPINEPHRINE PF 1 MG/ML IJ SOLN
INTRAMUSCULAR | Status: AC
  Filled 2020-06-29: qty 1

## 2020-06-29 MED ORDER — MIDAZOLAM HCL 2 MG/2ML IJ SOLN
INTRAMUSCULAR | Status: AC
  Filled 2020-06-29: qty 2

## 2020-06-29 MED ORDER — ACETAMINOPHEN 10 MG/ML IV SOLN
INTRAVENOUS | Status: AC
  Filled 2020-06-29: qty 100

## 2020-06-29 MED ORDER — FENTANYL CITRATE (PF) 100 MCG/2ML IJ SOLN
25.0000 ug | INTRAMUSCULAR | Status: DC | PRN
  Administered 2020-06-29: 50 ug via INTRAVENOUS

## 2020-06-29 MED ORDER — ACETAMINOPHEN 10 MG/ML IV SOLN
1000.0000 mg | Freq: Once | INTRAVENOUS | Status: DC | PRN
  Administered 2020-06-29: 1000 mg via INTRAVENOUS

## 2020-06-29 MED ORDER — LIDOCAINE-EPINEPHRINE (PF) 2 %-1:200000 IJ SOLN
INTRAMUSCULAR | Status: DC | PRN
  Administered 2020-06-29 (×5): 5 mg via INTRADERMAL

## 2020-06-29 MED ORDER — OXYCODONE HCL 5 MG PO TABS
5.0000 mg | ORAL_TABLET | Freq: Four times a day (QID) | ORAL | Status: DC | PRN
  Administered 2020-06-29 – 2020-06-30 (×5): 5 mg via ORAL
  Filled 2020-06-29 (×5): qty 1

## 2020-06-29 MED ORDER — FENTANYL CITRATE (PF) 100 MCG/2ML IJ SOLN
INTRAMUSCULAR | Status: AC
  Filled 2020-06-29: qty 2

## 2020-06-29 MED ORDER — PROPOFOL 500 MG/50ML IV EMUL
INTRAVENOUS | Status: DC | PRN
  Administered 2020-06-29 (×3): 20 mg via INTRAVENOUS

## 2020-06-29 MED ORDER — FENTANYL CITRATE (PF) 100 MCG/2ML IJ SOLN
INTRAMUSCULAR | Status: DC | PRN
Start: 2020-06-29 — End: 2020-06-29
  Administered 2020-06-29 (×2): 50 ug via INTRAVENOUS

## 2020-06-29 MED ORDER — LACTATED RINGERS IV SOLN: INTRAVENOUS | Status: DC | PRN

## 2020-06-29 MED ORDER — BUPIVACAINE HCL (PF) 0.5 % IJ SOLN
INTRAMUSCULAR | Status: AC
  Filled 2020-06-29: qty 30

## 2020-06-29 MED ORDER — LIDOCAINE HCL (PF) 2 % IJ SOLN
INTRAMUSCULAR | Status: AC
  Filled 2020-06-29: qty 20

## 2020-06-29 MED ORDER — MIDAZOLAM HCL 5 MG/5ML IJ SOLN
INTRAMUSCULAR | Status: DC | PRN
  Administered 2020-06-29 (×2): 1 mg via INTRAVENOUS

## 2020-06-29 SURGICAL SUPPLY — 25 items
BENZOIN TINCTURE PRP APPL 2/3 (GAUZE/BANDAGES/DRESSINGS) ×3 IMPLANT
BLADE SURG 11 STRL SS (BLADE) ×3 IMPLANT
CLOTH BEACON ORANGE TIMEOUT ST (SAFETY) ×3 IMPLANT
DRESSING OPSITE X SMALL 2X3 (GAUZE/BANDAGES/DRESSINGS) ×3 IMPLANT
DRSG OPSITE POSTOP 3X4 (GAUZE/BANDAGES/DRESSINGS) ×3 IMPLANT
DURAPREP 26ML APPLICATOR (WOUND CARE) ×3 IMPLANT
ELECT REM PT RETURN 9FT ADLT (ELECTROSURGICAL) ×3
ELECTRODE REM PT RTRN 9FT ADLT (ELECTROSURGICAL) ×1 IMPLANT
GLOVE BIOGEL PI IND STRL 7.0 (GLOVE) ×3 IMPLANT
GLOVE BIOGEL PI INDICATOR 7.0 (GLOVE) ×6
GLOVE ECLIPSE 7.0 STRL STRAW (GLOVE) ×3 IMPLANT
GOWN STRL REUS W/TWL LRG LVL3 (GOWN DISPOSABLE) ×6 IMPLANT
NEEDLE HYPO 22GX1.5 SAFETY (NEEDLE) ×3 IMPLANT
NS IRRIG 1000ML POUR BTL (IV SOLUTION) ×3 IMPLANT
PACK ABDOMINAL MINOR (CUSTOM PROCEDURE TRAY) ×3 IMPLANT
PENCIL BUTTON HOLSTER BLD 10FT (ELECTRODE) ×3 IMPLANT
PROTECTOR NERVE ULNAR (MISCELLANEOUS) ×3 IMPLANT
SPONGE LAP 4X18 RFD (DISPOSABLE) IMPLANT
SUT VIC AB 0 CT1 27 (SUTURE) ×2
SUT VIC AB 0 CT1 27XBRD ANBCTR (SUTURE) ×1 IMPLANT
SUT VICRYL 4-0 PS2 18IN ABS (SUTURE) ×3 IMPLANT
SYR CONTROL 10ML LL (SYRINGE) ×3 IMPLANT
TOWEL OR 17X24 6PK STRL BLUE (TOWEL DISPOSABLE) ×6 IMPLANT
TRAY FOLEY CATH SILVER 14FR (SET/KITS/TRAYS/PACK) ×3 IMPLANT
WATER STERILE IRR 1000ML POUR (IV SOLUTION) ×3 IMPLANT

## 2020-06-29 NOTE — Progress Notes (Addendum)
Postpartum Day 1  Subjective Up ad lib, voiding, tolerating PO, and + flatus. Denies dizziness, lightheadedness, tachycardia. Appropriate Lochia. Pain well controlled.  Objective BP (!) 106/57   Pulse 81   Temp 97.7 F (36.5 C) (Oral)   Resp 11   Ht 5' 5" (1.651 m)   Wt 76.6 kg   LMP 10/05/2019   SpO2 99%   Breastfeeding Unknown   BMI 28.09 kg/m   Intake/Output      10/09 0701 - 10/10 0700 10/10 0701 - 10/11 0700   I.V. (mL/kg) 0 (0) 1000 (13.1)   Other 0    Total Intake(mL/kg) 0 (0) 1000 (13.1)   Urine (mL/kg/hr) 1600 (0.9)    Blood 202    Total Output 1802    Net -1802 +1000        Urine Occurrence 1 x      Physical Exam:  General: alert, cooperative, NAD Uterine Fundus: firm MSK: moving extremities spontaneously. Warm and well perfused. No signs of DVT.   Recent Labs    06/28/20 0215  HGB 11.6*  HCT 36.4   Assessment & Plan Postpartum Day # 1 . Plan for d/c tomorrow  . Pain: scheduled ibuprofen. PRN tylenol & oxycodone . Breastfeeding, continue daily lactation consult . Contraception: BTL today    . MMR prior to discharge    CHTN BP: (106-145)/(52-120) 106/57 (10/10 1033) Previously on lisinopril prior to pregnancy x 8 years. Breastfeeding  . Continue to monitor  . Consider labetalol or amlodipine if continued high pressures    LOS: 1 day   Rachel E Kim 06/29/2020, 10:41 AM   GME ATTESTATION:  I saw and evaluated the patient. I agree with the findings and the plan of care as documented in the resident's note.   C , MD OB Fellow, [facility] [place], [facility] 06/29/2020 10:54 AM   

## 2020-06-29 NOTE — Transfer of Care (Signed)
Immediate Anesthesia Transfer of Care Note  Patient: Gabriella Schmidt  Procedure(s) Performed: POST PARTUM TUBAL LIGATION (N/A )  Patient Location: PACU  Anesthesia Type:Epidural  Level of Consciousness: awake, alert , oriented and patient cooperative  Airway & Oxygen Therapy: Patient Spontanous Breathing  Post-op Assessment: Report given to RN and Post -op Vital signs reviewed and stable  Post vital signs: Reviewed and stable  Last Vitals:  Vitals Value Taken Time  BP    Temp    Pulse 84 06/29/20 1032  Resp 10 06/29/20 1032  SpO2 99 % 06/29/20 1032  Vitals shown include unvalidated device data.  Last Pain:  Vitals:   06/29/20 0900  TempSrc: Oral  PainSc:          Complications: No complications documented.

## 2020-06-29 NOTE — Progress Notes (Signed)
[facility] OB/GYN Attending Note  39 y.o. G1P0101 s/p recent vaginal delivery at [redacted]w[redacted]d who desires permanent sterilization.  Other reversible forms of contraception including more effective LARCs such as IUD or Nexplanon were discussed with patient; she declines all other modalities. Her FOB also is planning to get a vasectomy.  Patient was also given the option of an interval laparoscopic tubal ligation or bilateral salpingectomy which slightly increases the efficacy and is less invasive but she declined.  Details of postpartum tubal sterilization discussed in detail.   She was told that this will be performed as either salpingectomy or occlusion with Filshie clips, depending on difficulty of procedure, exposure and other factors.  Risks of procedure discussed with patient including but not limited to: risk of regret, permanence of method, bleeding, infection, injury to surrounding organs and need for additional procedures.  Failure risk of about 1-2% with increased risk of ectopic gestation if pregnancy occurs was also discussed with patient.  Also discussed possibility of post-tubal syndrome with increased pelvic pain or menstrual irregularities. Patient verbalized understanding of these risks and wants to proceed with sterilization.  Written informed consent obtained.  Will continue close observation and postpartum care as ordered. To OR when ready.    Verita Schneiders, MD, [place] for [employer], [place]

## 2020-06-29 NOTE — Op Note (Signed)
Noemy Hallmon 06/29/2020  PREOPERATIVE DIAGNOSES: Multiparity, undesired fertility  POSTOPERATIVE DIAGNOSES: Multiparity, undesired fertility  PROCEDURE:  Postpartum Bilateral Salpingectomy  SURGEONS: Dr.  Verita Schneiders and Dr. Corliss Blacker  ANESTHESIA:  Epidural and local analgesia using 10 ml of 3.7% Marcaine  COMPLICATIONS:  None immediate.  ESTIMATED BLOOD LOSS: 10 ml.  INDICATIONS:  39 y.o. G1P0101 with undesired fertility, status post vaginal delivery, desires permanent sterilization.  Other reversible forms of contraception were discussed with patient; she declines all other modalities. Risks of procedure discussed with patient including but not limited to: risk of regret, permanence of method, bleeding, infection, injury to surrounding organs and need for additional procedures.  Discussed failure risk of 1% with increased risk of ectopic gestation if pregnancy occurs.  Also discussed possibility of post-tubal syndrome with increased pelvic pain or menstrual irregularities.  Patient verbalized understanding of these risks and wants to proceed with sterilization.  Written informed consent obtained.     FINDINGS:  Normal uterus, tubes, and ovaries. Excised fallopian tubes were sent to pathology.   PROCEDURE DETAILS: The patient was taken to the operating room where her epidural anesthesia was dosed up to surgical level and found to be adequate.  She was then placed in the dorsal supine position and prepped and draped in sterile fashion.  After an adequate timeout was performed, attention was turned to the patient's abdomen local analgesia was administered.  A small transverse skin incision was made under the umbilical fold. The incision was taken down to the layer of fascia using the scalpel, and fascia was incised, and extended bilaterally using Mayo scissors. The peritoneum was entered in a sharp fashion.  Attention was then turned to the patient's uterus, and left fallopian tube  was then identified, and the Babcock clamp was then used to grasp the tube. Kelly forceps were placed on the distal portion of the mesosalpinx underneath about 70% of the tube.  This pedicle was double suture ligated with 0 Vicryl, and this large portion of the tube including the fimbriated end was excised.  The right fallopian tube was then identified, doubly ligated, excised in a similar fashion allowing for bilateral salpingectomy.   Good hemostasis was noted overall.  The instruments were then removed from the patient's abdomen and the fascial incision was repaired with 0 Vicryl, and the skin was closed with a 4-0 Vicryl subcuticular stitch. The patient tolerated the procedure well.  Instrument, sponge, and needle counts were correct times three.  The patient was then taken to the recovery room awake and in stable condition.   Verita Schneiders, MD, [place] for [employer], [place]

## 2020-06-29 NOTE — Progress Notes (Signed)
CSW received consult for hx of Anxiety.   CSW met with MOB to offer support and complete assessment.    CSW congratulated MOB on the birth of infant. CSW noted that MOB had guest in the room therefore CSW advised MOB of the HIPPA policy in which MOB was agreeable to having FOB's mother leave room. CSW then advised MOB of the reason for CSW coming to speak with her. MOB reported that she was diagnosed with anxiety at age 37-15. MOB reported that she is was on Vistaril during pregnancy for her anxiety but has plans to continue use of this Zoloft now that MOB has given birth. . MOB expressed that she also quit smoking previously which made her anxiety worse. MOB advised CSW that she feels that her anxiety in the past had a lot to do with her work and the job at that time. MOB expressed that she has been feeling well since giving birth aside from just being in pain. MOB reported that she isn't feeling SI. HI nor is she involved in DV.  CSW inquired from MOB on other mental health hx in which MOB expressed that she doesn't have any other mental health. MOB reported that she has support from FOB, his family, and her parents who MOB expressed are on the way in from [place]. MOB expressed that her parents are bringing all supplies for baby as well. MOB expressed to CSW that she has been really happy since giving birth. MOB expressed that she has a basinet and crib for infant to sleep in once arrived home. MOB reported no other needs to CSW at this time.   CSW provided education regarding the baby blues period vs. perinatal mood disorders, discussed treatment and gave resources for mental health follow up if concerns arise.  CSW recommends self-evaluation during the postpartum time period using the New Mom Checklist from Postpartum Progress and encouraged MOB to contact a medical professional if symptoms are noted at any time.   CSW provided review of Sudden Infant Death Syndrome (SIDS) precautions.     CSW  identifies no further need for intervention and no barriers to discharge at this time.    S. , MSW, LCSW [facility] [phone]   

## 2020-06-29 NOTE — Anesthesia Preprocedure Evaluation (Addendum)
Anesthesia Evaluation  Patient identified by MRN, date of birth, ID band Patient awake    Reviewed: Allergy & Precautions, NPO status , Patient's Chart, lab work & pertinent test results  Airway Mallampati: II  TM Distance: >3 FB Neck ROM: Full    Dental no notable dental hx.    Pulmonary neg pulmonary ROS, former smoker,    Pulmonary exam normal breath sounds clear to auscultation       Cardiovascular hypertension, negative cardio ROS Normal cardiovascular exam Rhythm:Regular Rate:Normal     Neuro/Psych PSYCHIATRIC DISORDERS Anxiety negative neurological ROS     GI/Hepatic negative GI ROS, Neg liver ROS,   Endo/Other  negative endocrine ROS  Renal/GU negative Renal ROS  negative genitourinary   Musculoskeletal  (+) Arthritis ,   Abdominal   Peds  Hematology negative hematology ROS (+)   Anesthesia Other Findings Presents for PPTL. Epidural in place and worked well for labor.   Reproductive/Obstetrics                             Anesthesia Physical Anesthesia Plan  ASA: II  Anesthesia Plan: Epidural   Post-op Pain Management:    Induction:   PONV Risk Score and Plan: 2 and Treatment may vary due to age or medical condition and Midazolam  Airway Management Planned: Natural Airway  Additional Equipment:   Intra-op Plan:   Post-operative Plan:   Informed Consent: I have reviewed the patients History and Physical, chart, labs and discussed the procedure including the risks, benefits and alternatives for the proposed anesthesia with the patient or authorized representative who has indicated his/her understanding and acceptance.       Plan Discussed with: Anesthesiologist  Anesthesia Plan Comments: (Patient identified. Risks, benefits, options discussed with patient including but not limited to bleeding, infection, nerve damage, paralysis, failed block, incomplete pain  control, headache, blood pressure changes, nausea, vomiting, reactions to medication, itching, and post partum back pain. Confirmed with bedside nurse the patient's most recent platelet count. Confirmed with the patient that they are not taking any anticoagulation, have any bleeding history or any family history of bleeding disorders. Patient expressed understanding and wishes to proceed. All questions were answered. )        Anesthesia Quick Evaluation

## 2020-06-29 NOTE — Anesthesia Postprocedure Evaluation (Signed)
Anesthesia Post Note  Patient: Gabriella Schmidt  Procedure(s) Performed: POST PARTUM TUBAL LIGATION (N/A )     Patient location during evaluation: PACU Anesthesia Type: Epidural Level of consciousness: awake and alert Pain management: pain level controlled Vital Signs Assessment: post-procedure vital signs reviewed and stable Respiratory status: spontaneous breathing, nonlabored ventilation and respiratory function stable Cardiovascular status: stable Postop Assessment: no headache, no backache and epidural receding Anesthetic complications: no   No complications documented.  Last Vitals:  Vitals:   06/29/20 1215 06/29/20 1247  BP: 133/82 130/69  Pulse: 91 77  Resp: 18 18  Temp:  37.1 C  SpO2: 98% 100%    Last Pain:  Vitals:   06/29/20 1247  TempSrc: Oral  PainSc: 7    Pain Goal:                Epidural/Spinal Function Cutaneous sensation: Tingles (06/29/20 1247), Patient able to flex knees: Yes (06/29/20 1247), Patient able to lift hips off bed: No (06/29/20 1247), Back pain beyond tenderness at insertion site: No (06/29/20 1247), Progressively worsening motor and/or sensory loss: No (06/29/20 1247), Bowel and/or bladder incontinence post epidural: No (06/29/20 1247)  [place]

## 2020-06-29 NOTE — Anesthesia Postprocedure Evaluation (Signed)
Anesthesia Post Note  Patient: Gabriella Schmidt  Procedure(s) Performed: AN AD HOC LABOR EPIDURAL     Patient location during evaluation: Mother Baby Anesthesia Type: Epidural Level of consciousness: awake and alert and oriented Pain management: satisfactory to patient Vital Signs Assessment: post-procedure vital signs reviewed and stable Respiratory status: spontaneous breathing and nonlabored ventilation Cardiovascular status: stable Postop Assessment: no headache, no backache, no signs of nausea or vomiting, adequate PO intake and patient able to bend at knees (patient up walking) Anesthetic complications: no   No complications documented.  Last Vitals:  Vitals:   06/29/20 0554 06/29/20 0841  BP: 126/75 120/66  Pulse: 82 73  Resp: 20 18  Temp: 36.8 C 37 C  SpO2: 100% 100%    Last Pain:  Vitals:   06/29/20 0841  TempSrc: Oral  PainSc:    Pain Goal:                Epidural/Spinal Function Cutaneous sensation: Normal sensation (06/29/20 0803), Patient able to flex knees: Yes (06/29/20 0803), Patient able to lift hips off bed: Yes (06/29/20 0803), Back pain beyond tenderness at insertion site: No (06/29/20 0803), Progressively worsening motor and/or sensory loss: No (06/29/20 0803), Bowel and/or bladder incontinence post epidural: No (06/29/20 0803)  Willa Rough

## 2020-06-29 NOTE — Lactation Note (Signed)
This note was copied from a baby's chart. Lactation Consultation Note Baby 7 hrs old. Attempted to latch baby in football position and baby choking. Sat baby upright patted on back. Got mom to use bulb syring to clear mouth. Baby had lg. Emesis. Mostly clear water w/a small mount of digested formula.  Baby kept gagging and doing swallowing motion. Mom very sleepy. RN took baby to the nursery so mom could sleep and baby would be monitored for spitting up.  LC discussed milk production and managing and prevention of engorgement. Discussed briefly BF STS, I&O.  RN had brought DEBP kit in room. [place] set it up. Mom stated she was to tired to pump tonight would do it tomorrow and no need to tell her how to use it tonight because she wouldn't remember it tomorrow.  LPI information sheet given, discussed not feeding baby longer than 30 minutes. When LC trying to teach mom baby kept gagging as if choking.  Mom will need to be reviewed everything again tomorrow. [facility] brochure given.  Mom having BTL in am.  Patient Name: Gabriella Schmidt [id number] Date: 06/29/2020 Reason for consult: Initial assessment;Primapara;Late-preterm 34-36.6wks   Maternal Data Has patient been taught Hand Expression?: Yes Does the patient have breastfeeding experience prior to this delivery?: No  Feeding Feeding Type: Bottle Fed - Formula Nipple Type: Slow - flow  LATCH Score Latch: Too sleepy or reluctant, no latch achieved, no sucking elicited.  Audible Swallowing: None  Type of Nipple: Everted at rest and after stimulation (very short shaft)  Comfort (Breast/Nipple): Filling, red/small blisters or bruises, mild/mod discomfort (breast tight)  Hold (Positioning): Full assist, staff holds infant at breast  LATCH Score: 3  Interventions Interventions: Breast feeding basics reviewed;Breast compression;Assisted with latch;Shells;Skin to skin;Adjust position;Breast massage;Support pillows;Hand pump;DEBP;Position  options;Hand express;Pre-pump if needed  Lactation Tools Discussed/Used Tools: Shells;Pump Shell Type: Inverted Breast pump type: Double-Electric Breast Pump;Manual WIC Program: No   Consult Status Consult Status: Follow-up Date: 06/29/20 Follow-up type: In-patient    , Elta Guadeloupe 06/29/2020, 2:31 AM

## 2020-06-29 NOTE — Lactation Note (Addendum)
This note was copied from a baby's chart. Lactation Consultation Note RN asked LC to see mom d/t baby LPI and bld. Sugar 43. RN gave formula. Mom is BF/Formula. Baby is spity and hasn't been interested in latching that is why formula was given.  Attempted to see mom. Mom woke up when [facility] entered room. Mom stated baby ate at 10.40pm. [place] asked mom if baby cues before 0130 call me, if not LC will return then to assist in BF. Mom stated OK, Thank you.   Patient Name: Gabriella Schmidt [id number] Date: 06/29/2020     Maternal Data    Feeding Feeding Type: Bottle Fed - Formula Nipple Type: Slow - flow  LATCH Score                   Interventions    Lactation Tools Discussed/Used     Consult Status      ,  G 06/29/2020, 12:29 AM

## 2020-06-30 ENCOUNTER — Other Ambulatory Visit

## 2020-06-30 LAB — URINE CULTURE

## 2020-06-30 MED ORDER — IBUPROFEN 600 MG PO TABS
600.0000 mg | ORAL_TABLET | Freq: Four times a day (QID) | ORAL | 0 refills | Status: DC
Start: 2020-06-30 — End: 2020-07-04

## 2020-06-30 MED ORDER — OXYCODONE HCL 5 MG PO TABS
5.0000 mg | ORAL_TABLET | Freq: Four times a day (QID) | ORAL | 0 refills | Status: DC | PRN
Start: 2020-06-30 — End: 2020-07-15

## 2020-06-30 NOTE — Discharge Instructions (Signed)

## 2020-06-30 NOTE — Lactation Note (Signed)
This note was copied from a baby's chart. Lactation Consultation Note Baby 64 hrs old. Asked mom if she needed any assistance w/BF. Mom stated "no she is actually latching pretty good". Asked mom if she would call out for [facility] to see latch today. Mom stated ok.  Patient Name: Gabriella Schmidt [id number] Date: 06/30/2020 Reason for consult: Follow-up assessment;Primapara;Late-preterm 34-36.6wks   Maternal Data    Feeding Feeding Type: Bottle Fed - Formula Nipple Type: Slow - flow  LATCH Score                   Interventions    Lactation Tools Discussed/Used     Consult Status Consult Status: Follow-up Date: 06/30/20 Follow-up type: In-patient    , Elta Guadeloupe 06/30/2020, 5:05 AM

## 2020-06-30 NOTE — Lactation Note (Addendum)
This note was copied from a baby's chart. Lactation Consultation Note  Patient Name: Gabriella Schmidt [id number] Date: 06/30/2020 Reason for consult: Follow-up assessment;Difficult latch;Infant weight loss;Other (Comment);Hyperbilirubinemia (7 % weight loss)  Infant is 36 weeks 42 hours old with a 7% weight loss.  LC tried to contact NP, [name], to review feeding plans in the note. LC spoke with Gabriella Schmidt and reviewed feeding plans with her. She stated we could reduce the volume to 15-20 ml per feed. LC at the time of the consult with provider, noted the mother had already given 41 ml of formula at last feeding.   Infant 7 urine and 5 stool since birth.  LC went in to assess the infant latching at the breast. Mom states she had completing pumping since infant last feeding. She preferred I come back to assess the latch at 3:00pm at the time of the next feed. [place] reviewed with Mom importance of latching baby at the breast first and supplementing with formula 15-20 ml as tolerated according to LPTI guidelines.   LC returned and informed both Dr. Laurance Flatten and NP [place], that I was unable to assess the latch at this time and will come reassess at the next feeding.   Plan 1. Mom to feed infant based on cues 8-12x in 24 hour period no more than 3 hours without an attempt.           2. Mom to offer infant both breasts first then supplement with formula based on LPTI breastfeeding supplementation guideline for hours after birth.          3. LC to follow up and assess the latch prior to the next feed.          4 Mom to continue pumping q 3 hours for 15 minutes.   Addendum 4:46 pm  LC went in to see patient to observe a latch. Mom states she did not latch infant at the next feed since he was placed under lights for hyperbilirubinemia. LC encouraged Mom to pump consistently q 3 hours for 15 minutes to increase her output. Mom is aware that a documented latch is needed to ensure adequate milk  transfer by either [facility] or RN. [name] reviewed with provider, [name], that second attempt to see a latch was unsuccessful. Mom to call RN or LC before next feeding for assistance. [name] talked with RN to encourage Mom to pump consistently to get breast milk supply up.    [name] 06/30/2020, 2:08 PM

## 2020-06-30 NOTE — Lactation Note (Signed)
This note was copied from a baby's chart. Lactation Consultation Note Attempted to see mom, mom sleeping.  Patient Name: Gabriella Schmidt Date: 06/30/2020     Maternal Data    Feeding Feeding Type: Bottle Fed - Formula Nipple Type: Slow - flow  LATCH Score                   Interventions    Lactation Tools Discussed/Used     Consult Status      ,  G 06/30/2020, 4:31 AM

## 2020-07-01 ENCOUNTER — Ambulatory Visit: Payer: Self-pay

## 2020-07-01 LAB — SURGICAL PATHOLOGY

## 2020-07-01 NOTE — Lactation Note (Signed)
This note was copied from a baby's chart. Lactation Consultation Note  Patient Name: Gabriella Schmidt Date: 07/01/2020 Reason for consult: Follow-up assessment   Mother is a P27, infant is 39 hours old and is now at  8  % wt loss.  .  Mother reports that infant is feeding well. Breastfeeding and bottle feeding ebm and formula  Discussed treatment and prevention of engorgement.  Plan of Care : Breastfeed infant with feeding cues Supplement infant with ebm/formula, according to supplemental guidelines. Pump using a DEBP after each feeding for 15-20 mins.   Mother to continue to cue base feed infant and feed at least 8-12 times or more in 24 hours and advised to allow for cluster feeding infant as needed.   Mother to continue to due STS. Mother is aware of available LC services at [facility], BFSG'S, OP Dept, and phone # for questions or concerns about breastfeeding.  Mother receptive to all teaching and plan of care.     Maternal Data    Feeding Feeding Type: Bottle Fed - Formula Nipple Type: Slow - flow  LATCH Score                   Interventions Interventions: Hand pump;DEBP;Ice  Lactation Tools Discussed/Used     Consult Status Consult Status: Complete    Darla Lesches 07/01/2020, 10:49 AM

## 2020-07-03 ENCOUNTER — Other Ambulatory Visit

## 2020-07-03 ENCOUNTER — Encounter: Admitting: Obstetrics & Gynecology

## 2020-07-04 ENCOUNTER — Other Ambulatory Visit: Payer: Self-pay | Admitting: Obstetrics and Gynecology

## 2020-07-04 ENCOUNTER — Telehealth

## 2020-07-07 ENCOUNTER — Other Ambulatory Visit

## 2020-07-07 ENCOUNTER — Telehealth (INDEPENDENT_AMBULATORY_CARE_PROVIDER_SITE_OTHER): Admitting: *Deleted

## 2020-07-07 ENCOUNTER — Encounter: Payer: Self-pay | Admitting: *Deleted

## 2020-07-07 VITALS — BP 136/92 | HR 82

## 2020-07-07 DIAGNOSIS — Z0130 Encounter for examination of blood pressure without abnormal findings: Secondary | ICD-10-CM

## 2020-07-07 DIAGNOSIS — Z0131 Encounter for examination of blood pressure with abnormal findings: Secondary | ICD-10-CM

## 2020-07-07 MED ORDER — AMLODIPINE BESYLATE 5 MG PO TABS: 5.0000 mg | ORAL_TABLET | Freq: Every day | ORAL | 6 refills | Status: DC

## 2020-07-07 MED ORDER — LISINOPRIL 20 MG PO TABS: 10.0000 mg | ORAL_TABLET | Freq: Every day | ORAL | 6 refills | Status: DC

## 2020-07-07 NOTE — Progress Notes (Addendum)
   NURSE VISIT- BLOOD PRESSURE CHECK  SUBJECTIVE:  NHI BUTRUM is a 39 y.o. G62P0101 female here for BP check. She is postpartum, delivery date 06/28/20    HYPERTENSION ROS:  Pregnant/postpartum:  . Severe headaches that don't go away with tylenol/other medicines: No  . Visual changes (seeing spots/double/blurred vision) No  . Severe pain under right breast breast or in center of upper chest No  . Severe nausea/vomiting No  . Taking medicines as instructed not applicable  .   OBJECTIVE:  BP (!) 136/92 (BP Location: Right Arm, Patient Position: Sitting, Cuff Size: Normal)   Pulse 82   Appearance alert, well appearing, and in no distress.  ASSESSMENT: Postpartum  blood pressure check  PLAN: Discussed with Nigel Berthold, CNM   Recommendations: new prescription will be sent  Norvasc 5mg  Follow-up: as scheduled Friday for BP check  Janece Canterbury  07/07/2020 4:02 PM

## 2020-07-07 NOTE — Addendum Note (Signed)
Addended by: Christin Fudge on: 07/07/2020 04:24 PM   Modules accepted: Orders

## 2020-07-08 ENCOUNTER — Other Ambulatory Visit: Payer: Self-pay | Admitting: Obstetrics and Gynecology

## 2020-07-10 ENCOUNTER — Encounter: Admitting: Advanced Practice Midwife

## 2020-07-10 ENCOUNTER — Other Ambulatory Visit

## 2020-07-11 ENCOUNTER — Encounter: Payer: Self-pay | Admitting: *Deleted

## 2020-07-11 ENCOUNTER — Telehealth (INDEPENDENT_AMBULATORY_CARE_PROVIDER_SITE_OTHER): Admitting: *Deleted

## 2020-07-11 VITALS — BP 140/96

## 2020-07-11 DIAGNOSIS — Z0130 Encounter for examination of blood pressure without abnormal findings: Secondary | ICD-10-CM

## 2020-07-11 DIAGNOSIS — Z0131 Encounter for examination of blood pressure with abnormal findings: Secondary | ICD-10-CM

## 2020-07-11 NOTE — Progress Notes (Addendum)
   NURSE VISIT- BLOOD PRESSURE CHECK  I connected with  Gabriella Schmidt on 07/21/20 by a video enabled telemedicine application and verified that I am speaking with the correct person using two identifiers. Patient was in her home and I was in the office at [facility].    I discussed the limitations of evaluation and management by telemedicine. The patient expressed understanding and agreed to proceed.    SUBJECTIVE:  Gabriella Schmidt is a 39 y.o. G42P0101 female here for BP check. She is postpartum, delivery date 06/28/20    HYPERTENSION ROS:  Pregnant/postpartum:  . Severe headaches that don't go away with tylenol/other medicines: No  . Visual changes (seeing spots/double/blurred vision) No  . Severe pain under right breast breast or in center of upper chest No  . Severe nausea/vomiting No  . Taking medicines as instructed yes   OBJECTIVE:  BP (!) 140/96   Appearance alert, well appearing, and in no distress.  ASSESSMENT: Postpartum  blood pressure check  PLAN: Discussed with Derrek Monaco, AGNP   Recommendations: increase Norvasc to 10 mg and recheck BP on Wednesday.    Follow-up: Wednesday for BP Check with nurse   Janece Canterbury  07/11/2020 11:27 AM

## 2020-07-11 NOTE — Progress Notes (Signed)
Chart reviewed for nurse visit. Agree with plan of care.  Estill Dooms, NP 07/11/2020 12:28 PM

## 2020-07-14 ENCOUNTER — Other Ambulatory Visit

## 2020-07-15 ENCOUNTER — Telehealth (INDEPENDENT_AMBULATORY_CARE_PROVIDER_SITE_OTHER): Admitting: Women's Health

## 2020-07-15 ENCOUNTER — Encounter: Payer: Self-pay | Admitting: Women's Health

## 2020-07-15 ENCOUNTER — Encounter: Payer: Self-pay | Admitting: *Deleted

## 2020-07-15 VITALS — BP 134/89

## 2020-07-15 DIAGNOSIS — Z9889 Other specified postprocedural states: Secondary | ICD-10-CM

## 2020-07-15 DIAGNOSIS — O1093 Unspecified pre-existing hypertension complicating the puerperium: Secondary | ICD-10-CM

## 2020-07-15 DIAGNOSIS — F418 Other specified anxiety disorders: Secondary | ICD-10-CM | POA: Diagnosis not present

## 2020-07-15 DIAGNOSIS — O99345 Other mental disorders complicating the puerperium: Secondary | ICD-10-CM

## 2020-07-15 DIAGNOSIS — I10 Essential (primary) hypertension: Secondary | ICD-10-CM

## 2020-07-15 MED ORDER — SERTRALINE HCL 50 MG PO TABS: 50.0000 mg | ORAL_TABLET | Freq: Every day | ORAL | 6 refills | Status: DC

## 2020-07-15 NOTE — Patient Instructions (Signed)
Tips To Increase Milk Supply Lots of water! Enough so that your urine is clear Plenty of calories, if you're not getting enough calories, your milk supply can decrease Breastfeed/pump often, every 2-3 hours x 20-30mins Fenugreek 3 pills 3 times a day, this may make your urine smell like maple syrup Mother's Milk Tea Lactation cookies, google for the recipe Real oatmeal Body Armor sports drinks Greater Than hydration drink  

## 2020-07-15 NOTE — Progress Notes (Signed)
[facility] VIRTUAL GYN VISIT ENCOUNTER NOTE Patient name: Gabriella Schmidt MRN [id number]  Date of birth: 03-09-1981  I connected with patient on 07/15/20 at 11:30 AM EDT by MyChart video  and verified that I am speaking with the correct person using two identifiers.  Pt is not currently in the office, she is at home.  Provider is in the office.    I discussed the limitations, risks, security and privacy concerns of performing an evaluation and management service by telephone and the availability of in person appointments. I also discussed with the patient that there may be a patient responsible charge related to this service. The patient expressed understanding and agreed to proceed.   Chief Complaint:   Blood Pressure Check (And PPD)  History of Present Illness:   Gabriella Schmidt is a 39 y.o. G41P0101 Caucasian female 2wks s/p SVB being evaluated today for postpartum bp and mood check. CHTN, taking norvasc 10mg . Was on lisinopril prior to pregnancy. Feels like norvasc is definitely helping to lower bp. H/O dep/anx prior to pregnancy, was on zoloft 200mg , stopped w/ +PT. Does feel anxious. Breastfeeding and having a hard time w/ milk supply which makes her more anxious. Denies SI/HI/II. EPDS today 8. Wants to restart zoloft.    Depression screen [facility] 2/9 04/24/2020 01/31/2020  Decreased Interest 0 1  Down, Depressed, Hopeless 1 0  PHQ - 2 Score 1 1  Altered sleeping 1 2  Tired, decreased energy 0 3  Change in appetite 0 2  Feeling bad or failure about yourself  0 0  Trouble concentrating 0 0  Moving slowly or fidgety/restless 0 2  Suicidal thoughts 0 0  PHQ-9 Score 2 10    No LMP recorded. Review of Systems:   Pertinent items are noted in HPI Denies fever/chills, dizziness, headaches, visual disturbances, fatigue, shortness of breath, chest pain, abdominal pain, vomiting, abnormal vaginal discharge/itching/odor/irritation, problems with periods, bowel movements, urination, or intercourse  unless otherwise stated above.  Pertinent History Reviewed:  Reviewed past medical,surgical, social, obstetrical and family history.  Reviewed problem list, medications and allergies. Physical Assessment:   Vitals:   07/15/20 1126  BP: 134/89  There is no height or weight on file to calculate BMI.       Physical Examination:   General:  Alert, oriented and cooperative.   Mental Status: Normal mood and affect perceived. Normal judgment and thought content.  Physical exam deferred due to nature of the encounter  No results found for this or any previous visit (from the past 24 hour(s)).  Assessment & Plan:  1) 2wks s/p SVB  2) Breastfeeding w/ inadequate supply> gave printed tips to increase supply  3) CHTN> on norvasc 10mg   4) Dep/anx> was on zoloft 200mg  prior to pregnancy. Rx zoloft 50mg  daily, f/u at pp visit on 11/19  Meds:  Meds ordered this encounter  Medications   sertraline (ZOLOFT) 50 MG tablet    Sig: Take 1 tablet (50 mg total) by mouth daily.    Dispense:  30 tablet    Refill:  6    Order Specific Question:   Supervising Provider    Answer:   Elonda Husky, LUTHER H [[id number]]    No orders of the defined types were placed in this encounter.   I discussed the assessment and treatment plan with the patient. The patient was provided an opportunity to ask questions and all were answered. The patient agreed with the plan and demonstrated an understanding of the instructions.  The patient was advised to call back or seek an in-person evaluation/go to the ED if the symptoms worsen or if the condition fails to improve as anticipated.  I provided 20 minutes of non-face-to-face time during this encounter.   Return for As scheduled 11/19 for pp visit.  Adrian, [facility] 07/15/2020 12:04 PM

## 2020-07-18 NOTE — Progress Notes (Signed)
Patients visit was via mychart. Patient was at home and I was in the office at [facility].

## 2020-07-21 ENCOUNTER — Other Ambulatory Visit

## 2020-07-21 ENCOUNTER — Other Ambulatory Visit: Payer: Self-pay | Admitting: Women's Health

## 2020-07-21 MED ORDER — HYDROCHLOROTHIAZIDE 25 MG PO TABS: 25.0000 mg | ORAL_TABLET | Freq: Every day | ORAL | 0 refills | Status: DC

## 2020-07-23 ENCOUNTER — Telehealth: Payer: Self-pay | Admitting: *Deleted

## 2020-07-23 ENCOUNTER — Inpatient Hospital Stay (HOSPITAL_COMMUNITY): Admit: 2020-07-23 | Payer: Self-pay

## 2020-07-23 MED ORDER — AMLODIPINE BESYLATE 10 MG PO TABS: 10.0000 mg | ORAL_TABLET | Freq: Every day | ORAL | 3 refills | Status: DC

## 2020-07-23 NOTE — Telephone Encounter (Signed)
Sent in rx for Norvasc 10 mg. Pt had 5 mg on hand and was instructed to take 10 mg daily. Pharmacy wouldn't refill 5 mg dose.

## 2020-07-25 ENCOUNTER — Other Ambulatory Visit: Payer: Self-pay | Admitting: Women's Health

## 2020-08-08 ENCOUNTER — Telehealth (INDEPENDENT_AMBULATORY_CARE_PROVIDER_SITE_OTHER): Payer: Self-pay | Admitting: Advanced Practice Midwife

## 2020-08-08 ENCOUNTER — Encounter: Payer: Self-pay | Admitting: Advanced Practice Midwife

## 2020-08-08 DIAGNOSIS — O99345 Other mental disorders complicating the puerperium: Secondary | ICD-10-CM

## 2020-08-08 DIAGNOSIS — F329 Major depressive disorder, single episode, unspecified: Secondary | ICD-10-CM

## 2020-08-08 DIAGNOSIS — F418 Other specified anxiety disorders: Secondary | ICD-10-CM

## 2020-08-08 DIAGNOSIS — I10 Essential (primary) hypertension: Secondary | ICD-10-CM

## 2020-08-08 DIAGNOSIS — O1093 Unspecified pre-existing hypertension complicating the puerperium: Secondary | ICD-10-CM

## 2020-08-08 DIAGNOSIS — Z9851 Tubal ligation status: Secondary | ICD-10-CM

## 2020-08-08 MED ORDER — LISINOPRIL 20 MG PO TABS: 20.0000 mg | ORAL_TABLET | Freq: Every day | ORAL | 3 refills | Status: DC

## 2020-08-08 MED ORDER — SERTRALINE HCL 100 MG PO TABS: 100.0000 mg | ORAL_TABLET | Freq: Every day | ORAL | 3 refills | Status: DC

## 2020-08-08 NOTE — Progress Notes (Signed)
TELEHEALTH VIRTUAL POSTPARTUM VISIT ENCOUNTER NOTE Patient name: Gabriella Schmidt MRN [id number]  Date of birth: 1980-11-24  I connected with patient on 08/08/20 at  9:50 AM EST by MyChart and verified that I am speaking with the correct person using two identifiers. Pt is not currently in our office, she is in the car.  The provider is in the office.    I discussed the limitations, risks, security and privacy concerns of performing an evaluation and management service by telephone and the availability of in person appointments. I also discussed with the patient that there may be a patient responsible charge related to this service. The patient expressed understanding and agreed to proceed.  Chief Complaint:   Postpartum Care  History of Present Illness:   Gabriella Schmidt is a 39 y.o. G53P0101 Caucasian female being seen today for a postpartum visit. She is 6 weeks postpartum following a vacuum, outlet at 36.3 gestational weeks. IOL: No  Anesthesia: epidural.  Laceration: 2nd degree.  Complications: PPROM/PTL @ 36wks with outlet vac for exhaustion. Inpatient contraception: yes ppBTL on PPD#1.   Pregnancy complicated by cHTN and depression. Tobacco use: no. Substance use disorder: no. Last pap smear: June 2021 and results were normal. Next pap smear due: June 2024 No LMP recorded.  Postpartum course has been complicated by antihypertensive med adjustments for cHTN- is having more frequent dizzy spells which is a sign of elevated BP for her and is requesting to stop Norvasc and go back on Lisinopril 61m (prepreg); and Zoloft for depression- is doing okay on 523m but is still having times of anxiety and would like to increase her dosage. Bleeding no bleeding. Bowel function is normal. Bladder function is normal. Urinary incontinence? No, fecal incontinence? No Patient is sexually active. Last sexual activity: recently.  Desired contraception: BTL done PP. Patient does not want a pregnancy in the  future.  Desired family size is 1 child.   The pregnancy intention screening data noted above was reviewed. Potential methods of contraception were discussed. The patient elected to proceed with Female Sterilization.   Edinburgh Postpartum Depression Screening: negative (on Zoloft 5058mwith some anxiety still)  Edinburgh Postnatal Depression Scale - 08/08/20 0954      Edinburgh Postnatal Depression Scale:  In the Past 7 Days   I have been able to laugh and see the funny side of things. 0    I have looked forward with enjoyment to things. 0    I have blamed myself unnecessarily when things went wrong. 1    I have been anxious or worried for no good reason. 2    I have felt scared or panicky for no good reason. 0    Things have been getting on top of me. 0    I have been so unhappy that I have had difficulty sleeping. 0    I have felt sad or miserable. 0    I have been so unhappy that I have been crying. 0    The thought of harming myself has occurred to me. 0    Edinburgh Postnatal Depression Scale Total 3          Baby's course has been uncomplicated. Baby is feeding by bottle. Infant has a pediatrician/family doctor? Yes.  Childcare strategy if returning to work/school: husband is a disabled vet and will be home with her.  Pt has material needs met for her and baby: Yes.   Review of Systems:   Pertinent items are  noted in HPI Denies Abnormal vaginal discharge w/ itching/odor/irritation, headaches, visual changes, shortness of breath, chest pain, abdominal pain, severe nausea/vomiting, or problems with urination or bowel movements. Pertinent History Reviewed:  Reviewed past medical,surgical, obstetrical and family history.  Reviewed problem list, medications and allergies. OB History  Gravida Para Term Preterm AB Living  1 1   1   1   SAB TAB Ectopic Multiple Live Births        0 1    # Outcome Date GA Lbr Len/2nd Weight Sex Delivery Anes PTL Lv  1 Preterm 06/28/20 32w3d15:31 /  03:38 6 lb 9.8 oz (2.999 kg) F Vag-Vacuum EPI  LIV     Birth Comments: WNL   Physical Assessment:   Vitals:   08/08/20 0951  BP: 128/85  Pulse: 91  There is no height or weight on file to calculate BMI.       Physical Examination:   General appearance: alert, well appearing, and in no distress  Mental status: alert, oriented to person, place, and time  Respiratory: normal respiratory effort, no distress           No results found for this or any previous visit (from the past 24 hour(s)).  Assessment & Plan:  1) Postpartum exam 2) Six wks s/p vacuum, outlet 3) bottle feeding 4) Depression screening- neg, on Zoloft 37m& will increase to 100107m) Contraception- s/p ppBTL 6) cHTN on Norvasc 80m79mill change to Lisinopril 20mg49mepreg dose)  Essential components of care per ACOG recommendations:  1.  Mood and well being:  . If positive depression screen, discussed and plan developed.  . If using tobacco we discussed reduction/cessation and risk of relapse . If current substance abuse, we discussed and referral to local resources was offered.   2. Infant care and feeding:  . If breastfeeding, discussed returning to work, pumping, breastfeeding-associated pain, guidance regarding return to fertility while lactating if not using another method. If needed, patient was provided with a letter to be allowed to pump q 2-3hrs to support lactation in a private location with access to a refrigerator to store breastmilk.   . Recommended that all caregivers be immunized for flu, pertussis and other preventable communicable diseases . If pt does not have material needs met for her/baby, referred to local resources for help obtaining these.  3. Sexuality, contraception and birth spacing . Provided guidance regarding sexuality, management of dyspareunia, and resumption of intercourse . Discussed avoiding interpregnancy interval <6mths60md recommended birth spacing of 18 months  4. Sleep and  fatigue . Discussed coping options for fatigue and sleep disruption . Encouraged family/partner/community support of 4 hrs of uninterrupted sleep to help with mood and fatigue  5. Physical recovery  . If pt had a C/S, assessed incisional pain and providing guidance on normal vs prolonged recovery . If pt had a laceration, perineal healing and pain reviewed.  . If urinary or fecal incontinence, discussed management and referred to PT or uro/gyn if indicated  . Patient is safe to resume physical activity. Discussed attainment of healthy weight.  6.  Chronic disease management . Discussed pregnancy complications if any, and their implications for future childbearing and long-term maternal health. . Review recommendations for prevention of recurrent pregnancy complications, such as 17 hydroxyprogesterone caproate to reduce risk for recurrent PTB not applicable, or aspirin to reduce risk of preeclampsia not applicable. . Pt had GDM: No. If yes, 2hr GTT scheduled: not applicable. . Reviewed medications and non-pregnant  dosing including consideration of whether pt is breastfeeding using a reliable resource such as LactMed: not applicable . Referred for f/u w/ PCP or subspecialist providers as indicated: yes- rec Eulogio Bear [facility] at [facility] for [facility] & depression management  7. Health maintenance . Mammogram at 39yo or earlier if indicated . Pap smears as indicated  Meds:  Meds ordered this encounter  Medications  . lisinopril (ZESTRIL) 20 MG tablet    Sig: Take 1 tablet (20 mg total) by mouth daily.    Dispense:  30 tablet    Refill:  3    Order Specific Question:   Supervising Provider    Answer:   Elonda Husky, LUTHER H [[id number]]  . sertraline (ZOLOFT) 100 MG tablet    Sig: Take 1 tablet (100 mg total) by mouth daily.    Dispense:  30 tablet    Refill:  3    Order Specific Question:   Supervising Provider    Answer:   Florian Buff [[id number]]    Follow-up: Return for 2wk RN virtual BP check; physical  1 year.   No orders of the defined types were placed in this encounter.   Myrtis Ser CNM 08/08/2020 10:20 AM

## 2020-08-22 ENCOUNTER — Telehealth: Payer: Self-pay

## 2020-08-26 ENCOUNTER — Ambulatory Visit: Payer: Self-pay | Admitting: Nurse Practitioner

## 2020-09-01 ENCOUNTER — Ambulatory Visit (INDEPENDENT_AMBULATORY_CARE_PROVIDER_SITE_OTHER): Payer: Self-pay | Admitting: Nurse Practitioner

## 2020-09-01 ENCOUNTER — Other Ambulatory Visit: Payer: Self-pay

## 2020-09-01 VITALS — BP 145/84 | HR 80 | Temp 98.1°F | Resp 20 | Ht 65.0 in | Wt 160.0 lb

## 2020-09-01 DIAGNOSIS — Z7689 Persons encountering health services in other specified circumstances: Secondary | ICD-10-CM | POA: Insufficient documentation

## 2020-09-01 DIAGNOSIS — J4520 Mild intermittent asthma, uncomplicated: Secondary | ICD-10-CM

## 2020-09-01 DIAGNOSIS — J45909 Unspecified asthma, uncomplicated: Secondary | ICD-10-CM | POA: Insufficient documentation

## 2020-09-01 DIAGNOSIS — F418 Other specified anxiety disorders: Secondary | ICD-10-CM

## 2020-09-01 DIAGNOSIS — I10 Essential (primary) hypertension: Secondary | ICD-10-CM

## 2020-09-01 MED ORDER — CHLORTHALIDONE 25 MG PO TABS: 25.0000 mg | ORAL_TABLET | Freq: Every day | ORAL | 1 refills | Status: DC

## 2020-09-01 NOTE — Assessment & Plan Note (Addendum)
-  Pt had positive urine pregnancy test on 08/08/20, not breastfeeding -was prescribed amlodipine by nurse midwife, no longer taking this -taking lisinopril 20 mg daily -Rx. Chlorthalidone since she states she gets some ankle swelling at times

## 2020-09-01 NOTE — Patient Instructions (Addendum)
I look forward to seeing you back in 2 weeks.  Please check your blood pressure daily and keep a log, so we can see if the new medication is helping.  Please get fasting lab work prior to the appointment in 2 weeks.

## 2020-09-01 NOTE — Assessment & Plan Note (Signed)
-  no issues today -she states this is seasonal and usually exacerbated by pollen -uses albuterol inhaler PRN
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
## 2020-09-01 NOTE — Assessment & Plan Note (Signed)
-  she delivered her baby in October, and she is NOT breastfeeding -can consider flu shot at time of physical

## 2020-09-15 ENCOUNTER — Telehealth: Admitting: Nurse Practitioner

## 2020-09-25 LAB — CBC WITH DIFFERENTIAL/PLATELET
Basophils Absolute: 0.1 10*3/uL (ref 0.0–0.2)
Basos: 1 %
EOS (ABSOLUTE): 0.2 10*3/uL (ref 0.0–0.4)
Eos: 2 %
Hematocrit: 36.4 % (ref 34.0–46.6)
Hemoglobin: 11.9 g/dL (ref 11.1–15.9)
Immature Grans (Abs): 0 10*3/uL (ref 0.0–0.1)
Immature Granulocytes: 0 %
Lymphocytes Absolute: 2.5 10*3/uL (ref 0.7–3.1)
Lymphs: 34 %
MCH: 26.9 pg (ref 26.6–33.0)
MCHC: 32.7 g/dL (ref 31.5–35.7)
MCV: 82 fL (ref 79–97)
Monocytes Absolute: 0.5 10*3/uL (ref 0.1–0.9)
Monocytes: 7 %
Neutrophils Absolute: 4.2 10*3/uL (ref 1.4–7.0)
Neutrophils: 56 %
Platelets: 334 10*3/uL (ref 150–450)
RBC: 4.42 x10E6/uL (ref 3.77–5.28)
RDW: 14.6 % (ref 11.7–15.4)
WBC: 7.5 10*3/uL (ref 3.4–10.8)

## 2020-09-25 LAB — CMP14+EGFR
ALT: 63 IU/L — ABNORMAL HIGH (ref 0–32)
AST: 38 IU/L (ref 0–40)
Albumin/Globulin Ratio: 1.5 (ref 1.2–2.2)
Albumin: 4.4 g/dL (ref 3.8–4.8)
Alkaline Phosphatase: 95 IU/L (ref 44–121)
BUN/Creatinine Ratio: 25 — ABNORMAL HIGH (ref 9–23)
BUN: 22 mg/dL — ABNORMAL HIGH (ref 6–20)
Bilirubin Total: 0.3 mg/dL (ref 0.0–1.2)
CO2: 29 mmol/L (ref 20–29)
Calcium: 9.1 mg/dL (ref 8.7–10.2)
Chloride: 100 mmol/L (ref 96–106)
Creatinine, Ser: 0.88 mg/dL (ref 0.57–1.00)
GFR calc Af Amer: 97 mL/min/{1.73_m2} (ref >59)
GFR calc non Af Amer: 84 mL/min/{1.73_m2} (ref >59)
Globulin, Total: 3 g/dL (ref 1.5–4.5)
Glucose: 77 mg/dL (ref 65–99)
Potassium: 4.2 mmol/L (ref 3.5–5.2)
Sodium: 141 mmol/L (ref 134–144)
Total Protein: 7.4 g/dL (ref 6.0–8.5)

## 2020-09-25 LAB — LIPID PANEL WITH LDL/HDL RATIO
Cholesterol, Total: 211 mg/dL — ABNORMAL HIGH (ref 100–199)
HDL: 60 mg/dL (ref >39)
LDL Chol Calc (NIH): 123 mg/dL — ABNORMAL HIGH (ref 0–99)
LDL/HDL Ratio: 2.1 ratio (ref 0.0–3.2)
Triglycerides: 160 mg/dL — ABNORMAL HIGH (ref 0–149)
VLDL Cholesterol Cal: 28 mg/dL (ref 5–40)

## 2020-09-25 NOTE — Progress Notes (Signed)
Her labs look good. Her cholesterol is a little high, but we will discuss that at our next appointment on 1/18.

## 2020-10-06 ENCOUNTER — Other Ambulatory Visit: Payer: Self-pay

## 2020-10-06 ENCOUNTER — Telehealth (INDEPENDENT_AMBULATORY_CARE_PROVIDER_SITE_OTHER): Admitting: Nurse Practitioner

## 2020-10-06 ENCOUNTER — Encounter: Payer: Self-pay | Admitting: Nurse Practitioner

## 2020-10-06 DIAGNOSIS — R945 Abnormal results of liver function studies: Secondary | ICD-10-CM | POA: Diagnosis not present

## 2020-10-06 DIAGNOSIS — M5137 Other intervertebral disc degeneration, lumbosacral region: Secondary | ICD-10-CM

## 2020-10-06 DIAGNOSIS — F418 Other specified anxiety disorders: Secondary | ICD-10-CM

## 2020-10-06 DIAGNOSIS — M51379 Other intervertebral disc degeneration, lumbosacral region without mention of lumbar back pain or lower extremity pain: Secondary | ICD-10-CM | POA: Insufficient documentation

## 2020-10-06 DIAGNOSIS — E785 Hyperlipidemia, unspecified: Secondary | ICD-10-CM | POA: Diagnosis not present

## 2020-10-06 DIAGNOSIS — R42 Dizziness and giddiness: Secondary | ICD-10-CM | POA: Diagnosis not present

## 2020-10-06 DIAGNOSIS — I10 Essential (primary) hypertension: Secondary | ICD-10-CM

## 2020-10-06 DIAGNOSIS — R7989 Other specified abnormal findings of blood chemistry: Secondary | ICD-10-CM

## 2020-10-06 MED ORDER — MECLIZINE HCL 25 MG PO TABS: 25.0000 mg | ORAL_TABLET | Freq: Four times a day (QID) | ORAL | 1 refills | Status: DC | PRN

## 2020-10-06 NOTE — Assessment & Plan Note (Addendum)
-  well controlled with current meds -continue sertraline -was on vistaril previously, unsure if this was stopped with pregnancy, but it is not on her current med list

## 2020-10-06 NOTE — Patient Instructions (Signed)
Preventing High Cholesterol Cholesterol is a white, waxy substance similar to fat that the human body needs to help build cells. The liver makes all the cholesterol that a person's body needs. Having high cholesterol (hypercholesterolemia) increases your risk for heart disease and stroke. Extra or excess cholesterol comes from the food that you eat. High cholesterol can often be prevented with diet and lifestyle changes. If you already have high cholesterol, you can control it with diet, lifestyle changes, and medicines. How can high cholesterol affect me? If you have high cholesterol, fatty deposits (plaques) may build up on the walls of your blood vessels. The blood vessels that carry blood away from your heart are called arteries. Plaques make the arteries narrower and stiffer. This in turn can:  Restrict or block blood flow and cause blood clots to form.  Increase your risk for heart attack and stroke. What can increase my risk for high cholesterol? This condition is more likely to develop in people who:  Eat foods that are high in saturated fat or cholesterol. Saturated fat is mostly found in foods that come from animal sources.  Are overweight.  Are not getting enough exercise.  Have a family history of high cholesterol (familial hypercholesterolemia). What actions can I take to prevent this? Nutrition  Eat less saturated fat.  Avoid trans fats (partially hydrogenated oils). These are often found in margarine and in some baked goods, fried foods, and snacks bought in packages.  Avoid precooked or cured meat, such as bacon, sausages, or meat loaves.  Avoid foods and drinks that have added sugars.  Eat more fruits, vegetables, and whole grains.  Choose healthy sources of protein, such as fish, poultry, lean cuts of red meat, beans, peas, lentils, and nuts.  Choose healthy sources of fat, such as: ? Nuts. ? Vegetable oils, especially olive oil. ? Fish that have healthy fats,  such as omega-3 fatty acids. These fish include mackerel or salmon.   Lifestyle  Lose weight if you are overweight. Maintaining a healthy body mass index (BMI) can help prevent or control high cholesterol. It can also lower your risk for diabetes and high blood pressure. Ask your health care provider to help you with a diet and exercise plan to lose weight safely.  Do not use any products that contain nicotine or tobacco, such as cigarettes, e-cigarettes, and chewing tobacco. If you need help quitting, ask your health care provider. Alcohol use  Do not drink alcohol if: ? Your health care provider tells you not to drink. ? You are pregnant, may be pregnant, or are planning to become pregnant.  If you drink alcohol: ? Limit how much you use to:  0-1 drink a day for women.  0-2 drinks a day for men. ? Be aware of how much alcohol is in your drink. In the [place] one drink equals one 12 oz bottle of beer (355 mL), one 5 oz glass of wine (148 mL), or one 1 oz glass of hard liquor (44 mL). Activity  Get enough exercise. Do exercises as told by your health care provider.  Each week, do at least 150 minutes of exercise that takes a medium level of effort (moderate-intensity exercise). This kind of exercise: ? Makes your heart beat faster while allowing you to still be able to talk. ? Can be done in short sessions several times a day or longer sessions a few times a week. For example, on 5 days each week, you could walk fast or ride   your bike 3 times a day for 10 minutes each time.   Medicines  Your health care provider may recommend medicines to help lower cholesterol. This may be a medicine to lower the amount of cholesterol that your liver makes. You may need medicine if: ? Diet and lifestyle changes have not lowered your cholesterol enough. ? You have high cholesterol and other risk factors for heart disease or stroke.  Take over-the-counter and prescription medicines only as told by your  health care provider. General information  Manage your risk factors for high cholesterol. Talk with your health care provider about all your risk factors and how to lower your risk.  Manage other conditions that you have, such as diabetes or high blood pressure (hypertension).  Have blood tests to check your cholesterol levels at regular points in time as told by your health care provider.  Keep all follow-up visits as told by your health care provider. This is important. Where to find more information  American Heart Association: www.heart.org  National Heart, Lung, and Blood Institute: www.nhlbi.nih.gov Summary  High cholesterol increases your risk for heart disease and stroke. By keeping your cholesterol level low, you can reduce your risk for these conditions.  High cholesterol can often be prevented with diet and lifestyle changes.  Work with your health care provider to manage your risk factors, and have your blood tested regularly. This information is not intended to replace advice given to you by your health care provider. Make sure you discuss any questions you have with your health care provider. Document Revised: 06/19/2019 Document Reviewed: 06/19/2019 Elsevier Patient Education  2021 Elsevier Inc.  

## 2020-10-06 NOTE — Assessment & Plan Note (Signed)
-  BP has been well controlled -taking lisinopril and chlorthalidone -she delivered her baby in 07/2020, not breastfeeding

## 2020-10-06 NOTE — Assessment & Plan Note (Signed)
Lab Results  Component Value Date   CHOL 211 (H) 09/24/2020   HDL 60 09/24/2020   LDLCALC 123 (H) 09/24/2020   TRIG 160 (H) 09/24/2020   -LDL elevated today -not currently on statin therapy; since LFTs elevated today will defer statin until repeat LFTs -discussed lifestyle management; handout provided

## 2020-10-06 NOTE — Assessment & Plan Note (Signed)
-  has chronic pain related to this -saw pain mgmt specialist previously

## 2020-10-06 NOTE — Assessment & Plan Note (Signed)
-  unsure of etiology -unable to perform physical exam on televisit today -Rx. Meclizine -if no improvement, she will f/u in office to check for orthostatic hypotension, Dix-Hallpike, and consider neuro referral

## 2020-10-06 NOTE — Assessment & Plan Note (Addendum)
Lab Results  Component Value Date   ALT 63 (H) 09/24/2020   AST 38 09/24/2020   ALKPHOS 95 09/24/2020   BILITOT 0.3 09/24/2020   -AST elevated, will monitor with routine labs

## 2020-10-06 NOTE — Progress Notes (Addendum)
Established Patient Office Visit  Subjective:  Patient ID: Gabriella Schmidt, female    DOB: Feb 05, 1981  Age: 40 y.o. MRN: [id number]  CC:  Chief Complaint  Patient presents with  . Follow-up  . Hypertension  . Anxiety    HPI Gabriella Schmidt presents for lab follow-up. She is still having dizzy spells.  He BP is normal when she has dizzy spells, with SBP in the 120s.  She notices she has dizzy spells when she is standing, but she states it can happen at any time.  She states she does have some room-spinning feeling, but she notices feeling lightheaded or off-balance at times.  She states that she has not been sleeping well.  She has chronic pain from L5-S1 degenerative disc disease as well as sciatica pain. For anxiety, she states this is well controlled.  No adverse medication effects.  Past Medical History:  Diagnosis Date  . Anxiety   . Asthma    Phreesia 09/01/2020  . Degenerative cervical disc   . Depression    [employer]reesia 09/01/2020  . Dizziness   . Hypertension     Past Surgical History:  Procedure Laterality Date  . TUBAL LIGATION N/A 06/29/2020   Procedure: POST PARTUM TUBAL LIGATION;  Surgeon: Osborne Oman, MD;  Location: [facility];  Service: Gynecology;  Laterality: N/A;    Family History  Problem Relation Age of Onset  . Hypertension Mother   . Hypertension Father   . Cardiomyopathy Father   . Hypertension Maternal Grandmother   . Hypertension Maternal Grandfather     Social History   Socioeconomic History  . Marital status: Married    Spouse name: [name]  . Number of children: Not on file  . Years of education: Not on file  . Highest education level: Not on file  Occupational History  . Not on file  Tobacco Use  . Smoking status: Former Research scientist (life sciences)  . Smokeless tobacco: Never Used  Vaping Use  . Vaping Use: Former  Substance and Sexual Activity  . Alcohol use: Not Currently  . Drug use: Never  . Sexual activity: Yes    Birth  control/protection: None  Other Topics Concern  . Not on file  Social History Narrative  . Not on file   Social Determinants of Health   Financial Resource Strain: Low Risk   . Difficulty of Paying Living Expenses: Not very hard  Food Insecurity: No Food Insecurity  . Worried About Charity fundraiser in the Last Year: Never true  . Ran Out of Food in the Last Year: Never true  Transportation Needs: No Transportation Needs  . Lack of Transportation (Medical): No  . Lack of Transportation (Non-Medical): No  Physical Activity: Insufficiently Active  . Days of Exercise per Week: 2 days  . Minutes of Exercise per Session: 10 min  Stress: Stress Concern Present  . Feeling of Stress : To some extent  Social Connections: Moderately Integrated  . Frequency of Communication with Friends and Family: More than three times a week  . Frequency of Social Gatherings with Friends and Family: Three times a week  . Attends Religious Services: More than 4 times per year  . Active Member of Clubs or Organizations: No  . Attends Archivist Meetings: Never  . Marital Status: Married  Human resources officer Violence: Not At Risk  . Fear of Current or Ex-Partner: No  . Emotionally Abused: No  . Physically Abused: No  . Sexually  Abused: No    Outpatient Medications Prior to Visit  Medication Sig Dispense Refill  . albuterol (VENTOLIN HFA) 108 (90 Base) MCG/ACT inhaler Inhale 2 puffs into the lungs every 6 (six) hours as needed for wheezing or shortness of breath. 8 g 2  . chlorthalidone (HYGROTON) 25 MG tablet Take 1 tablet (25 mg total) by mouth daily. 30 tablet 1  . lisinopril (ZESTRIL) 20 MG tablet Take 1 tablet (20 mg total) by mouth daily. 30 tablet 3  . Prenatal Vit-Fe Fumarate-FA (PRENATAL MULTIVITAMIN) TABS tablet Take 1 tablet by mouth daily at 12 noon.    . sertraline (ZOLOFT) 100 MG tablet Take 1 tablet (100 mg total) by mouth daily. 30 tablet 3   No facility-administered medications  prior to visit.    Allergies  Allergen Reactions  . Latex Rash    ROS Review of Systems    Objective:    Physical Exam  There were no vitals taken for this visit. Wt Readings from Last 3 Encounters:  09/01/20 160 lb (72.6 kg)  06/28/20 168 lb 12.8 oz (76.6 kg)  06/26/20 168 lb (76.2 kg)     Health Maintenance Due  Topic Date Due  . INFLUENZA VACCINE  Never done    There are no preventive care reminders to display for this patient.  No results found for: TSH Lab Results  Component Value Date   WBC 7.5 09/24/2020   HGB 11.9 09/24/2020   HCT 36.4 09/24/2020   MCV 82 09/24/2020   PLT 334 09/24/2020   Lab Results  Component Value Date   NA 141 09/24/2020   K 4.2 09/24/2020   CO2 29 09/24/2020   GLUCOSE 77 09/24/2020   BUN 22 (H) 09/24/2020   CREATININE 0.88 09/24/2020   BILITOT 0.3 09/24/2020   ALKPHOS 95 09/24/2020   AST 38 09/24/2020   ALT 63 (H) 09/24/2020   PROT 7.4 09/24/2020   ALBUMIN 4.4 09/24/2020   CALCIUM 9.1 09/24/2020   Lab Results  Component Value Date   CHOL 211 (H) 09/24/2020   Lab Results  Component Value Date   HDL 60 09/24/2020   Lab Results  Component Value Date   LDLCALC 123 (H) 09/24/2020   Lab Results  Component Value Date   TRIG 160 (H) 09/24/2020   No results found for: CHOLHDL No results found for: HGBA1C    Assessment & Plan:   Problem List Items Addressed This Visit      Cardiovascular and Mediastinum   Chronic hypertension    -BP has been well controlled -taking lisinopril and chlorthalidone -she delivered her baby in 07/2020, not breastfeeding      Relevant Orders   CBC with Differential/Platelet   CMP14+EGFR   Lipid Panel With LDL/HDL Ratio     Musculoskeletal and Integument   Degenerative disc disease at L5-S1 level    -has chronic pain related to this -saw pain mgmt specialist previously        Other   Depression with anxiety    -well controlled with current meds -continue sertraline -was  on vistaril previously, unsure if this was stopped with pregnancy, but it is not on her current med list      Liver function test abnormality    Lab Results  Component Value Date   ALT 63 (H) 09/24/2020   AST 38 09/24/2020   ALKPHOS 95 09/24/2020   BILITOT 0.3 09/24/2020   -AST elevated, will monitor with routine labs      Relevant  Orders   CBC with Differential/Platelet   CMP14+EGFR   Lipid Panel With LDL/HDL Ratio   Hyperlipidemia    Lab Results  Component Value Date   CHOL 211 (H) 09/24/2020   HDL 60 09/24/2020   LDLCALC 123 (H) 09/24/2020   TRIG 160 (H) 09/24/2020   -LDL elevated today -not currently on statin therapy; since LFTs elevated today will defer statin until repeat LFTs -discussed lifestyle management; handout provided        Relevant Orders   Lipid Panel With LDL/HDL Ratio   Dizziness - Primary    -unsure of etiology -unable to perform physical exam on televisit today -Rx. Meclizine -if no improvement, she will f/u in office to check for orthostatic hypotension, Dix-Hallpike, and consider neuro referral         Meds ordered this encounter  Medications  . meclizine (ANTIVERT) 25 MG tablet    Sig: Take 1 tablet (25 mg total) by mouth every 6 (six) hours as needed for dizziness.    Dispense:  30 tablet    Refill:  1   Date:  10/10/2020   Location of Patient: Home Location of Provider: Office Consent was obtain for visit to be over via telehealth. I verified that I am speaking with the correct person using two identifiers.  I connected with  Yetta Barre on 10/06/20 via telephone and verified that I am speaking with the correct person using two identifiers.   I discussed the limitations of evaluation and management by telemedicine. The patient expressed understanding and agreed to proceed.  Time spent: 15 minutes   Follow-up: Return in about 3 months (around 01/04/2021) for Lab follow-up.    Noreene Larsson, NP

## 2020-10-07 ENCOUNTER — Telehealth: Admitting: Nurse Practitioner

## 2020-10-20 ENCOUNTER — Other Ambulatory Visit: Payer: Self-pay | Admitting: Nurse Practitioner

## 2020-10-28 ENCOUNTER — Other Ambulatory Visit: Payer: Self-pay

## 2020-10-28 ENCOUNTER — Encounter: Payer: Self-pay | Admitting: Women's Health

## 2020-10-28 ENCOUNTER — Ambulatory Visit (INDEPENDENT_AMBULATORY_CARE_PROVIDER_SITE_OTHER): Admitting: Women's Health

## 2020-10-28 VITALS — BP 151/89 | HR 118 | Ht 65.0 in | Wt 150.0 lb

## 2020-10-28 DIAGNOSIS — Z8742 Personal history of other diseases of the female genital tract: Secondary | ICD-10-CM | POA: Diagnosis not present

## 2020-10-28 DIAGNOSIS — Z3202 Encounter for pregnancy test, result negative: Secondary | ICD-10-CM

## 2020-10-28 DIAGNOSIS — N920 Excessive and frequent menstruation with regular cycle: Secondary | ICD-10-CM

## 2020-10-28 LAB — POCT URINE PREGNANCY: Preg Test, Ur: NEGATIVE

## 2020-10-28 NOTE — Patient Instructions (Signed)
Let us know when bp's are consistently <140/90

## 2020-10-28 NOTE — Progress Notes (Signed)
   GYN VISIT Patient name: Gabriella Schmidt MRN [id number]  Date of birth: 06-06-1981 Chief Complaint:   Contraception  History of Present Illness:   Gabriella Schmidt is a 40 y.o. G52P0101 Caucasian female being seen today to discuss starting COCs. Had BTL in Oct. Has h/o heavy painful periods and ovarian cysts and did great on COCs in past, so wants to resume before periods get back to the way they were. Has had 1 period, heavy x 7d, no bad cramps. Denies abnormal discharge, itching/odor/irritation.   Has CHTN, is currently seeing PCP to get controlled. Is currently on lisinopril and chlorthalidone, goes back for next visit in April.  Depression screen [facility] 2/9 10/06/2020 09/01/2020 09/01/2020 04/24/2020 01/31/2020  Decreased Interest 0 0 0 0 1  Down, Depressed, Hopeless 0 1 1 1  0  PHQ - 2 Score 0 1 1 1 1   Altered sleeping 0 0 - 1 2  Tired, decreased energy 0 3 - 0 3  Change in appetite 0 0 - 0 2  Feeling bad or failure about yourself  0 0 - 0 0  Trouble concentrating 0 0 - 0 0  Moving slowly or fidgety/restless 0 2 - 0 2  Suicidal thoughts 0 0 - 0 0  PHQ-9 Score 0 6 - 2 10  Difficult doing work/chores - Not difficult at all - - -    No LMP recorded. The current method of family planning is tubal ligation.  Last pap 02/21/20. Results were: NILM w/ HRHPV negative Review of Systems:   Pertinent items are noted in HPI Denies fever/chills, dizziness, headaches, visual disturbances, fatigue, shortness of breath, chest pain, abdominal pain, vomiting, abnormal vaginal discharge/itching/odor/irritation, problems with periods, bowel movements, urination, or intercourse unless otherwise stated above.  Pertinent History Reviewed:  Reviewed past medical,surgical, social, obstetrical and family history.  Reviewed problem list, medications and allergies. Physical Assessment:   Vitals:   10/28/20 1046  BP: (!) 151/89  Pulse: (!) 118  Weight: 150 lb (68 kg)  Height: 5\' 5"  (1.651 m)  Body mass index is  24.96 kg/m.       Physical Examination:   General appearance: alert, well appearing, and in no distress  Mental status: alert, oriented to person, place, and time  Skin: warm & dry   Cardiovascular: normal heart rate noted  Respiratory: normal respiratory effort, no distress  Abdomen: soft, non-tender   Pelvic: examination not indicated  Extremities: no edema   Chaperone: N/A    Results for orders placed or performed in visit on 10/28/20 (from the past 24 hour(s))  POCT urine pregnancy   Collection Time: 10/28/20 10:49 AM  Result Value Ref Range   Preg Test, Ur Negative Negative    Assessment & Plan:  1) Heavy periods> h/o same w/ bad cramps and ovarian cysts. Has BTL. Wants COCs for period/cyst management. Discussed can't do COCs until bp consistently <140/90s, so continue working w/ PCP to get this controlled, then let us know. Did well w/ continuous COCs in past.  Meds: No orders of the defined types were placed in this encounter.   Orders Placed This Encounter  Procedures  . POCT urine pregnancy    Return in about 1 year (around 10/28/2021) for Physical.  [place], [facility] 10/28/2020 11:31 AM

## 2020-10-29 ENCOUNTER — Encounter: Payer: Self-pay | Admitting: Nurse Practitioner

## 2020-10-29 ENCOUNTER — Ambulatory Visit (INDEPENDENT_AMBULATORY_CARE_PROVIDER_SITE_OTHER): Admitting: Nurse Practitioner

## 2020-10-29 DIAGNOSIS — I10 Essential (primary) hypertension: Secondary | ICD-10-CM | POA: Diagnosis not present

## 2020-10-29 DIAGNOSIS — R42 Dizziness and giddiness: Secondary | ICD-10-CM | POA: Diagnosis not present

## 2020-10-29 NOTE — Patient Instructions (Addendum)
For blood pressure, it was great today, but you have had high readings at home and at the GYN office. Since your BP is great today, we will change the schedule of medications. Take the lisinopril in the evening or bedtime, and keep the chlorthalidone in the morning, and we will se if that gets a consistently great BP.  For vertigo, you are taking meclizine, but this isn't helping. I sent a referral to ENT. Additionally, I included a handout for information and for the Epley maneuver which can help the symptoms of vertigo.  The handout is helpful, but there are also great Youtube videos about the Epley maneuver where you can watch physical therapists walk you though the exercise.  Benign Positional Vertigo Vertigo is the feeling that you or your surroundings are moving when they are not. Benign positional vertigo is the most common form of vertigo. This is usually a harmless condition (benign). This condition is positional. This means that symptoms are triggered by certain movements and positions. This condition can be dangerous if it occurs while you are doing something that could cause harm to you or others. This includes activities such as driving or operating machinery. What are the causes? The inner ear has fluid-filled canals that help your brain sense movement and balance. When the fluid moves, the brain receives messages about your body's position. With benign positional vertigo, crystals in the inner ear break free and disturb the inner ear area. This causes your brain to receive confusing messages about your body's position. What increases the risk? You are more likely to develop this condition if:  You are a woman.  You are 53 years of age or older.  You have recently had a head injury.  You have an inner ear disease. What are the signs or symptoms? Symptoms of this condition usually happen when you move your head or your eyes in different directions. Symptoms may start suddenly, and  usually last for less than a minute. They include:  Loss of balance and falling.  Feeling like you are spinning or moving.  Feeling like your surroundings are spinning or moving.  Nausea and vomiting.  Blurred vision.  Dizziness.  Involuntary eye movement (nystagmus). Symptoms can be mild and cause only minor problems, or they can be severe and interfere with daily life. Episodes of benign positional vertigo may return (recur) over time. Symptoms may improve over time. How is this diagnosed? This condition may be diagnosed based on:  Your medical history.  Physical exam of the head, neck, and ears.  Positional tests to check for or stimulate vertigo. You may be asked to turn your head and change positions, such as going from sitting to lying down. A health care provider will watch for symptoms of vertigo. You may be referred to a health care provider who specializes in ear, nose, and throat problems (ENT, or otolaryngologist) or a provider who specializes in disorders of the nervous system (neurologist). How is this treated? This condition may be treated in a session in which your health care provider moves your head in specific positions to help the displaced crystals in your inner ear move. Treatment for this condition may take several sessions. Surgery may be needed in severe cases, but this is rare. In some cases, benign positional vertigo may resolve on its own in 2-4 weeks.   Follow these instructions at home: Safety  Move slowly. Avoid sudden body or head movements or certain positions, as told by your health care  provider.  Avoid driving until your health care provider says it is safe for you to do so.  Avoid operating heavy machinery until your health care provider says it is safe for you to do so.  Avoid doing any tasks that would be dangerous to you or others if vertigo occurs.  If you have trouble walking or keeping your balance, try using a cane for stability. If  you feel dizzy or unstable, sit down right away.  Return to your normal activities as told by your health care provider. Ask your health care provider what activities are safe for you. General instructions  Take over-the-counter and prescription medicines only as told by your health care provider.  Drink enough fluid to keep your urine pale yellow.  Keep all follow-up visits as told by your health care provider. This is important. Contact a health care provider if:  You have a fever.  Your condition gets worse or you develop new symptoms.  Your family or friends notice any behavioral changes.  You have nausea or vomiting that gets worse.  You have numbness or a prickling and tingling sensation. Get help right away if you:  Have difficulty speaking or moving.  Are always dizzy.  Faint.  Develop severe headaches.  Have weakness in your legs or arms.  Have changes in your hearing or vision.  Develop a stiff neck.  Develop sensitivity to light. Summary  Vertigo is the feeling that you or your surroundings are moving when they are not. Benign positional vertigo is the most common form of vertigo.  This condition is caused by crystals in the inner ear that become displaced. This causes a disturbance in an area of the inner ear that helps your brain sense movement and balance.  Symptoms include loss of balance and falling, feeling that you or your surroundings are moving, nausea and vomiting, and blurred vision.  This condition can be diagnosed based on symptoms, a physical exam, and positional tests.  Follow safety instructions as told by your health care provider. You will also be told when to contact your health care provider in case of problems. This information is not intended to replace advice given to you by your health care provider. Make sure you discuss any questions you have with your health care provider. Document Revised: 07/31/2019 Document Reviewed:  02/15/2018 Elsevier Patient Education  2021 [employer].  How to Perform the Epley Maneuver The Epley maneuver is an exercise that relieves symptoms of vertigo. Vertigo is the feeling that you or your surroundings are moving when they are not. When you feel vertigo, you may feel like the room is spinning and may have trouble walking. The Epley maneuver is used for a type of vertigo caused by a calcium deposit in a part of the inner ear. The maneuver involves changing head positions to help the deposit move out of the area. You can do this maneuver at home whenever you have symptoms of vertigo. You can repeat it in 24 hours if your vertigo has not gone away. Even though the Epley maneuver may relieve your vertigo for a few weeks, it is possible that your symptoms will return. This maneuver relieves vertigo, but it does not relieve dizziness. What are the risks? If it is done correctly, the Epley maneuver is considered safe. Sometimes it can lead to dizziness or nausea that goes away after a short time. If you develop other symptoms-such as changes in vision, weakness, or numbness-stop doing the maneuver and  call your health care provider. Supplies needed:  A bed or table.  A pillow. How to do the Epley maneuver 1. Sit on the edge of a bed or table with your back straight and your legs extended or hanging over the edge of the bed or table. 2. Turn your head halfway toward the affected ear or side as told by your health care provider. 3. Lie backward quickly with your head turned until you are lying flat on your back. You may want to position a pillow under your shoulders. 4. Hold this position for at least 30 seconds. If you feel dizzy or have symptoms of vertigo, continue to hold the position until the symptoms stop. 5. Turn your head to the opposite direction until your unaffected ear is facing the floor. 6. Hold this position for at least 30 seconds. If you feel dizzy or have symptoms of  vertigo, continue to hold the position until the symptoms stop. 7. Turn your whole body to the same side as your head so that you are positioned on your side. Your head will now be nearly facedown. Hold for at least 30 seconds. If you feel dizzy or have symptoms of vertigo, continue to hold the position until the symptoms stop. 8. Sit back up. You can repeat the maneuver in 24 hours if your vertigo does not go away.      Follow these instructions at home: For 24 hours after doing the Epley maneuver:  Keep your head in an upright position.  When lying down to sleep or rest, keep your head raised (elevated) with two or more pillows.  Avoid excessive neck movements. Activity  Do not drive or use machinery if you feel dizzy.  After doing the Epley maneuver, return to your normal activities as told by your health care provider. Ask your health care provider what activities are safe for you. General instructions  Drink enough fluid to keep your urine pale yellow.  Do not drink alcohol.  Take over-the-counter and prescription medicines only as told by your health care provider.  Keep all follow-up visits as told by your health care provider. This is important. Preventing vertigo symptoms Ask your health care provider if there is anything you should do at home to prevent vertigo. He or she may recommend that you:  Keep your head elevated with two or more pillows while you sleep.  Do not sleep on the side of your affected ear.  Get up slowly from bed.  Avoid sudden movements during the day.  Avoid extreme head positions or movement, such as looking up or bending over. Contact a health care provider if:  Your vertigo gets worse.  You have other symptoms, including: ? Nausea. ? Vomiting. ? Headache. Get help right away if you:  Have vision changes.  Have a headache or neck pain that is severe or getting worse.  Cannot stop vomiting.  Have new numbness or weakness in any  part of your body. Summary  Vertigo is the feeling that you or your surroundings are moving when they are not.  The Epley maneuver is an exercise that relieves symptoms of vertigo.  If the Epley maneuver is done correctly, it is considered safe and relieves vertigo quickly. This information is not intended to replace advice given to you by your health care provider. Make sure you discuss any questions you have with your health care provider. Document Revised: 07/04/2019 Document Reviewed: 07/04/2019 Elsevier Patient Education  2021 [employer].

## 2020-10-29 NOTE — Assessment & Plan Note (Addendum)
-  taking lisinopril 20 mg daily -taking chlorthalidone 25 mg daily now as well -her BP is perfect today, but home BPs and GYN BP was elevated -move lisinopril dose to evening and keep chlorthalidone in the AM, will try to get a more consistent BP -negative for orthostatic hypotension today, but her HR did increase dramatically on standing; if Epley maneuver and ENT are not helpful, may consider cardiology consult for workup for POTS

## 2020-10-29 NOTE — Progress Notes (Signed)
Acute Office Visit  Subjective:    Patient ID: Gabriella Schmidt, female    DOB: [birth year], 40 y.o.   MRN: [id number]  Chief Complaint  Patient presents with  . Hypertension    Ongoing x4 months, since giving birth in October.    HPI Patient is in today for high blood pressure.  Her SBP was in the 150s yesterday at her GYN appointment, and that is in-line with her home readings. She gave birth in October. She is not breastfeeding.  She is still having dizzy spells, ans she states they are occurring about twice per hour and last about 30 seconds. It occurs when moving usually, but sometimes she has them while in bed. She states the meclizine is not helping.  Past Medical History:  Diagnosis Date  . Anxiety   . Asthma    Phreesia 09/01/2020  . Degenerative cervical disc   . Depression    Phreesia 09/01/2020  . Dizziness   . Hypertension     Past Surgical History:  Procedure Laterality Date  . TUBAL LIGATION N/A 06/29/2020   Procedure: POST PARTUM TUBAL LIGATION;  Surgeon: Osborne Oman, MD;  Location: [facility];  Service: Gynecology;  Laterality: N/A;    Family History  Problem Relation Age of Onset  . Hypertension Mother   . Hypertension Father   . Cardiomyopathy Father   . Hypertension Maternal Grandmother   . Hypertension Maternal Grandfather     Social History   Socioeconomic History  . Marital status: Married    Spouse name: Susana Duell  . Number of children: Not on file  . Years of education: Not on file  . Highest education level: Not on file  Occupational History  . Not on file  Tobacco Use  . Smoking status: Former Research scientist (life sciences)  . Smokeless tobacco: Never Used  Vaping Use  . Vaping Use: Former  Substance and Sexual Activity  . Alcohol use: Not Currently  . Drug use: Never  . Sexual activity: Yes    Birth control/protection: None  Other Topics Concern  . Not on file  Social History Narrative  . Not on file   Social Determinants of Health    Financial Resource Strain: Low Risk   . Difficulty of Paying Living Expenses: Not very hard  Food Insecurity: No Food Insecurity  . Worried About Charity fundraiser in the Last Year: Never true  . Ran Out of Food in the Last Year: Never true  Transportation Needs: No Transportation Needs  . Lack of Transportation (Medical): No  . Lack of Transportation (Non-Medical): No  Physical Activity: Insufficiently Active  . Days of Exercise per Week: 2 days  . Minutes of Exercise per Session: 10 min  Stress: Stress Concern Present  . Feeling of Stress : To some extent  Social Connections: Moderately Integrated  . Frequency of Communication with Friends and Family: More than three times a week  . Frequency of Social Gatherings with Friends and Family: Three times a week  . Attends Religious Services: More than 4 times per year  . Active Member of Clubs or Organizations: No  . Attends Archivist Meetings: Never  . Marital Status: Married  Human resources officer Violence: Not At Risk  . Fear of Current or Ex-Partner: No  . Emotionally Abused: No  . Physically Abused: No  . Sexually Abused: No    Outpatient Medications Prior to Visit  Medication Sig Dispense Refill  . albuterol (VENTOLIN HFA) 108 (  90 Base) MCG/ACT inhaler Inhale 2 puffs into the lungs every 6 (six) hours as needed for wheezing or shortness of breath. 8 g 2  . chlorthalidone (HYGROTON) 25 MG tablet TAKE 1 TABLET(25 MG) BY MOUTH DAILY 90 tablet 0  . lisinopril (ZESTRIL) 20 MG tablet Take 1 tablet (20 mg total) by mouth daily. 30 tablet 3  . meclizine (ANTIVERT) 25 MG tablet Take 1 tablet (25 mg total) by mouth every 6 (six) hours as needed for dizziness. 30 tablet 1  . Multiple Vitamin (MULTIVITAMIN) tablet Take 1 tablet by mouth daily.    . sertraline (ZOLOFT) 100 MG tablet Take 1 tablet (100 mg total) by mouth daily. 30 tablet 3  . Prenatal Vit-Fe Fumarate-FA (PRENATAL MULTIVITAMIN) TABS tablet Take 1 tablet by mouth  daily at 12 noon. (Patient not taking: Reported on 10/29/2020)     No facility-administered medications prior to visit.    Allergies  Allergen Reactions  . Latex Rash    Review of Systems  Constitutional: Negative.   Respiratory: Negative.   Cardiovascular: Negative.   Neurological: Positive for dizziness.  Psychiatric/Behavioral: Positive for sleep disturbance.       Objective:    Physical Exam Constitutional:      Appearance: Normal appearance.  Cardiovascular:     Rate and Rhythm: Normal rate and regular rhythm.     Pulses: Normal pulses.     Heart sounds: Normal heart sounds.  Pulmonary:     Effort: Pulmonary effort is normal.     Breath sounds: Normal breath sounds.  Neurological:     Mental Status: She is alert.     Cranial Nerves: No cranial nerve deficit.     Sensory: No sensory deficit.     Motor: No weakness.     Coordination: Coordination normal.     Gait: Gait normal.     BP 118/66 (BP Location: Left Arm, Patient Position: Sitting, Cuff Size: Normal)   Pulse (!) 110   Temp (!) 97.5 F (36.4 C) (Temporal)   Ht 5\' 5"  (1.651 m)   Wt 149 lb (67.6 kg)   LMP 10/01/2020   SpO2 98%   BMI 24.79 kg/m  Wt Readings from Last 3 Encounters:  10/29/20 149 lb (67.6 kg)  10/28/20 150 lb (68 kg)  09/01/20 160 lb (72.6 kg)    There are no preventive care reminders to display for this patient.  There are no preventive care reminders to display for this patient.   No results found for: TSH Lab Results  Component Value Date   WBC 7.5 09/24/2020   HGB 11.9 09/24/2020   HCT 36.4 09/24/2020   MCV 82 09/24/2020   PLT 334 09/24/2020   Lab Results  Component Value Date   NA 141 09/24/2020   K 4.2 09/24/2020   CO2 29 09/24/2020   GLUCOSE 77 09/24/2020   BUN 22 (H) 09/24/2020   CREATININE 0.88 09/24/2020   BILITOT 0.3 09/24/2020   ALKPHOS 95 09/24/2020   AST 38 09/24/2020   ALT 63 (H) 09/24/2020   PROT 7.4 09/24/2020   ALBUMIN 4.4 09/24/2020   CALCIUM  9.1 09/24/2020   Lab Results  Component Value Date   CHOL 211 (H) 09/24/2020   Lab Results  Component Value Date   HDL 60 09/24/2020   Lab Results  Component Value Date   LDLCALC 123 (H) 09/24/2020   Lab Results  Component Value Date   TRIG 160 (H) 09/24/2020   No results found for: CHOLHDL  No results found for: HGBA1C     Assessment & Plan:   Problem List Items Addressed This Visit      Cardiovascular and Mediastinum   Chronic hypertension    -taking lisinopril 20 mg daily -taking chlorthalidone 25 mg daily now as well -her BP is perfect today, but home BPs and GYN BP was elevated -move lisinopril dose to evening and keep chlorthalidone in the AM, will try to get a more consistent BP -negative for orthostatic hypotension today, but her HR did increase dramatically on standing; if Epley maneuver and ENT are not helpful, may consider cardiology consult for workup for POTS         Other   Dizziness    -positive Dix-Hallpike today -she has been using meclizine without relief -provided Epley maneuver handout -refer to ENT      Relevant Orders   Ambulatory referral to ENT       No orders of the defined types were placed in this encounter.    Noreene Larsson, NP

## 2020-10-29 NOTE — Assessment & Plan Note (Addendum)
-  positive Dix-Hallpike today -she has been using meclizine without relief -provided Epley maneuver handout -refer to ENT

## 2020-10-30 ENCOUNTER — Ambulatory Visit: Admitting: Advanced Practice Midwife

## 2020-11-16 ENCOUNTER — Encounter: Payer: Self-pay | Admitting: Emergency Medicine

## 2020-11-16 ENCOUNTER — Ambulatory Visit: Admission: EM | Admit: 2020-11-16 | Discharge: 2020-11-16 | Disposition: A | Discharge status: Home / Self Care

## 2020-11-16 ENCOUNTER — Other Ambulatory Visit: Payer: Self-pay

## 2020-11-16 ENCOUNTER — Encounter (HOSPITAL_COMMUNITY): Payer: Self-pay | Admitting: Emergency Medicine

## 2020-11-16 ENCOUNTER — Emergency Department (HOSPITAL_COMMUNITY)
Admission: EM | Admit: 2020-11-16 | Discharge: 2020-11-16 | Disposition: A | Discharge status: Home / Self Care | Attending: Emergency Medicine | Admitting: Emergency Medicine

## 2020-11-16 DIAGNOSIS — I10 Essential (primary) hypertension: Secondary | ICD-10-CM | POA: Diagnosis not present

## 2020-11-16 DIAGNOSIS — R031 Nonspecific low blood-pressure reading: Secondary | ICD-10-CM

## 2020-11-16 DIAGNOSIS — Z79899 Other long term (current) drug therapy: Secondary | ICD-10-CM | POA: Diagnosis not present

## 2020-11-16 DIAGNOSIS — Z87891 Personal history of nicotine dependence: Secondary | ICD-10-CM | POA: Diagnosis not present

## 2020-11-16 DIAGNOSIS — I951 Orthostatic hypotension: Secondary | ICD-10-CM

## 2020-11-16 DIAGNOSIS — Z91040 Latex allergy status: Secondary | ICD-10-CM | POA: Insufficient documentation

## 2020-11-16 DIAGNOSIS — J45909 Unspecified asthma, uncomplicated: Secondary | ICD-10-CM | POA: Insufficient documentation

## 2020-11-16 DIAGNOSIS — R42 Dizziness and giddiness: Secondary | ICD-10-CM

## 2020-11-16 HISTORY — DX: Edema, unspecified: R60.9

## 2020-11-16 LAB — COMPREHENSIVE METABOLIC PANEL
ALT: 31 U/L (ref 0–44)
AST: 22 U/L (ref 15–41)
Albumin: 4.6 g/dL (ref 3.5–5.0)
Alkaline Phosphatase: 75 U/L (ref 38–126)
Anion gap: 7 (ref 5–15)
BUN: 32 mg/dL — ABNORMAL HIGH (ref 6–20)
CO2: 27 mmol/L (ref 22–32)
Calcium: 9.3 mg/dL (ref 8.9–10.3)
Chloride: 101 mmol/L (ref 98–111)
Creatinine, Ser: 1.11 mg/dL — ABNORMAL HIGH (ref 0.44–1.00)
GFR, Estimated: >60 mL/min (ref >60)
Glucose, Bld: 78 mg/dL (ref 70–99)
Potassium: 4.1 mmol/L (ref 3.5–5.1)
Sodium: 135 mmol/L (ref 135–145)
Total Bilirubin: 0.6 mg/dL (ref 0.3–1.2)
Total Protein: 8.6 g/dL — ABNORMAL HIGH (ref 6.5–8.1)

## 2020-11-16 LAB — CBC WITH DIFFERENTIAL/PLATELET
Abs Immature Granulocytes: 0.03 10*3/uL (ref 0.00–0.07)
Basophils Absolute: 0.1 10*3/uL (ref 0.0–0.1)
Basophils Relative: 1 %
Eosinophils Absolute: 0.1 10*3/uL (ref 0.0–0.5)
Eosinophils Relative: 1 %
HCT: 36.5 % (ref 36.0–46.0)
Hemoglobin: 11.7 g/dL — ABNORMAL LOW (ref 12.0–15.0)
Immature Granulocytes: 0 %
Lymphocytes Relative: 39 %
Lymphs Abs: 3.3 10*3/uL (ref 0.7–4.0)
MCH: 28.3 pg (ref 26.0–34.0)
MCHC: 32.1 g/dL (ref 30.0–36.0)
MCV: 88.4 fL (ref 80.0–100.0)
Monocytes Absolute: 0.6 10*3/uL (ref 0.1–1.0)
Monocytes Relative: 8 %
Neutro Abs: 4.3 10*3/uL (ref 1.7–7.7)
Neutrophils Relative %: 51 %
Platelets: 388 10*3/uL (ref 150–400)
RBC: 4.13 MIL/uL (ref 3.87–5.11)
RDW: 14.3 % (ref 11.5–15.5)
WBC: 8.4 10*3/uL (ref 4.0–10.5)
nRBC: 0 % (ref 0.0–0.2)

## 2020-11-16 LAB — URINALYSIS, ROUTINE W REFLEX MICROSCOPIC
Bilirubin Urine: NEGATIVE
Glucose, UA: NEGATIVE mg/dL
Hgb urine dipstick: NEGATIVE
Ketones, ur: NEGATIVE mg/dL
Nitrite: NEGATIVE
Protein, ur: NEGATIVE mg/dL
Specific Gravity, Urine: 1.013 (ref 1.005–1.030)
pH: 5 (ref 5.0–8.0)

## 2020-11-16 MED ORDER — SODIUM CHLORIDE 0.9 % IV BOLUS
500.0000 mL | Freq: Once | INTRAVENOUS | Status: AC
  Administered 2020-11-16: 500 mL via INTRAVENOUS

## 2020-11-16 MED ORDER — SODIUM CHLORIDE 0.9 % IV BOLUS
1000.0000 mL | Freq: Once | INTRAVENOUS | Status: AC
  Administered 2020-11-16: 1000 mL via INTRAVENOUS

## 2020-11-16 NOTE — Discharge Instructions (Signed)
Go to ER for further evaluation .  

## 2020-11-16 NOTE — ED Triage Notes (Addendum)
Pt reports she has been having a low blood pressure since yesterday morning.  Also have dizziness but has been having this problem since January, given meclizine but has not helped. Also reports she woke up with a headache this morning.  Pt does take bp meds and has been taking them them every day.  HCTZ in the morning and lisinopril at night.

## 2020-11-16 NOTE — ED Provider Notes (Signed)
[place]   [id number] 11/16/20 Arrival Time: 1696   Chief Complaint  Patient presents with  . Hypotension     SUBJECTIVE: History from: patient.  Gabriella Schmidt is a 40 y.o. female with history of dizziness and hypertension presented to the urgent care with a complaint of low blood pressure.  Has been seen by PCP on 10/29/20 and was advised to readjust timing of lisinopril 20 mg and chlorthalidone 25 mg.  Now she has been taking lisinopril 20 mg in the evening and chlorthalidone in the morning.  Developed low blood pressure with this change.  With low BP this morning she still took chlorthalidone.  She is  complaining of dizziness that has been ongoing for the past few month.  Has tried  meclizine for dizziness without relief.  Denies any alleviating or aggravating factors.  Reports symptoms are intermittent and ongoing daily.  Denies chills, fever, nausea, URI, chest pain, shortness of breath.  ROS: As per HPI.  All other pertinent ROS negative.      Past Medical History:  Diagnosis Date  . Anxiety   . Asthma    Phreesia 09/01/2020  . Degenerative cervical disc   . Depression    Phreesia 09/01/2020  . Dizziness   . Hypertension    Past Surgical History:  Procedure Laterality Date  . TUBAL LIGATION N/A 06/29/2020   Procedure: POST PARTUM TUBAL LIGATION;  Surgeon: Osborne Oman, MD;  Location: [facility];  Service: Gynecology;  Laterality: N/A;   Allergies  Allergen Reactions  . Latex Rash   No current facility-administered medications on file prior to encounter.   Current Outpatient Medications on File Prior to Encounter  Medication Sig Dispense Refill  . albuterol (VENTOLIN HFA) 108 (90 Base) MCG/ACT inhaler Inhale 2 puffs into the lungs every 6 (six) hours as needed for wheezing or shortness of breath. 8 g 2  . chlorthalidone (HYGROTON) 25 MG tablet TAKE 1 TABLET(25 MG) BY MOUTH DAILY 90 tablet 0  . lisinopril (ZESTRIL) 20 MG tablet Take 1 tablet (20  mg total) by mouth daily. 30 tablet 3  . meclizine (ANTIVERT) 25 MG tablet Take 1 tablet (25 mg total) by mouth every 6 (six) hours as needed for dizziness. 30 tablet 1  . Multiple Vitamin (MULTIVITAMIN) tablet Take 1 tablet by mouth daily.    . sertraline (ZOLOFT) 100 MG tablet Take 1 tablet (100 mg total) by mouth daily. 30 tablet 3   Social History   Socioeconomic History  . Marital status: Married    Spouse name: Envi Eagleson  . Number of children: Not on file  . Years of education: Not on file  . Highest education level: Not on file  Occupational History  . Not on file  Tobacco Use  . Smoking status: Former Research scientist (life sciences)  . Smokeless tobacco: Never Used  Vaping Use  . Vaping Use: Former  Substance and Sexual Activity  . Alcohol use: Not Currently  . Drug use: Never  . Sexual activity: Yes    Birth control/protection: None  Other Topics Concern  . Not on file  Social History Narrative  . Not on file   Social Determinants of Health   Financial Resource Strain: Low Risk   . Difficulty of Paying Living Expenses: Not very hard  Food Insecurity: No Food Insecurity  . Worried About Charity fundraiser in the Last Year: Never true  . Ran Out of Food in the Last Year: Never true  Transportation  Needs: No Transportation Needs  . Lack of Transportation (Medical): No  . Lack of Transportation (Non-Medical): No  Physical Activity: Insufficiently Active  . Days of Exercise per Week: 2 days  . Minutes of Exercise per Session: 10 min  Stress: Stress Concern Present  . Feeling of Stress : To some extent  Social Connections: Moderately Integrated  . Frequency of Communication with Friends and Family: More than three times a week  . Frequency of Social Gatherings with Friends and Family: Three times a week  . Attends Religious Services: More than 4 times per year  . Active Member of Clubs or Organizations: No  . Attends Archivist Meetings: Never  . Marital Status: Married   Human resources officer Violence: Not At Risk  . Fear of Current or Ex-Partner: No  . Emotionally Abused: No  . Physically Abused: No  . Sexually Abused: No   Family History  Problem Relation Age of Onset  . Hypertension Mother   . Hypertension Father   . Cardiomyopathy Father   . Hypertension Maternal Grandmother   . Hypertension Maternal Grandfather     OBJECTIVE:  Vitals:   11/16/20 1104  BP: 106/68  Pulse: 96  Resp: 19  Temp: 98.6 F (37 C)  TempSrc: Oral  SpO2: 98%     Physical Exam Vitals and nursing note reviewed.  Constitutional:      General: She is not in acute distress.    Appearance: Normal appearance. She is normal weight. She is not ill-appearing, toxic-appearing or diaphoretic.  HENT:     Head: Normocephalic.     Right Ear: Tympanic membrane, ear canal and external ear normal. There is no impacted cerumen.     Left Ear: Tympanic membrane, ear canal and external ear normal. There is no impacted cerumen.     Nose: Nose normal. No congestion.  Cardiovascular:     Rate and Rhythm: Normal rate and regular rhythm.     Pulses: Normal pulses.     Heart sounds: Normal heart sounds. No murmur heard. No friction rub. No gallop.   Pulmonary:     Effort: Pulmonary effort is normal. No respiratory distress.     Breath sounds: Normal breath sounds. No stridor. No wheezing, rhonchi or rales.  Chest:     Chest wall: No tenderness.  Neurological:     General: No focal deficit present.     Mental Status: She is alert and oriented to person, place, and time.     Cranial Nerves: Cranial nerves are intact.     Sensory: Sensation is intact.     Motor: Motor function is intact.     Coordination: Coordination is intact.     Gait: Gait is intact.     LABS:  No results found for this or any previous visit (from the past 24 hour(s)).   ASSESSMENT & PLAN:  1. Low blood pressure reading   2. Dizziness     No orders of the defined types were placed in this  encounter.  Patient is stable at discharge.  Orthostatic blood pressure was done at the urgent care with lowest blood pressure at sitting 73/48.  BP has continued to drop and therefore patient was advised to go to ER for further evaluation.  Declined EMS.  Differential diagnosis included but not limited to dehydration.  Discharge instructions  Go to ER for further evaluation  Reviewed expectations re: course of current medical issues. Questions answered. Outlined signs and symptoms indicating need for more  acute intervention. Patient verbalized understanding. After Visit Summary given.         Emerson Monte, [place] 11/16/20 1149

## 2020-11-16 NOTE — ED Notes (Signed)
Patient is being discharged from the Urgent Care and sent to the Emergency Department via pov. Per K. Avegno, patient is in need of higher level of care due to hypotension. Patient is aware and verbalizes understanding of plan of care.  Vitals:   11/16/20 1104  BP: 106/68  Pulse: 96  Resp: 19  Temp: 98.6 F (37 C)  SpO2: 98%

## 2020-11-16 NOTE — ED Triage Notes (Signed)
Patient seen at Urgent Care today for hypotension that started this morning. Per patient usually has hypertension and takes lisinopril and HCTZ. Patient sent here to ED due to being orthostatic positive. Per staff sitting bp 73/48. lying and standing were in the 02'B systolic. Per patient vertigo x1 month in which she is being seen by PCP Dr Veleta Miners .Denies any nausea, vomiting, fevers, urinary symptoms, or diarrhea.

## 2020-11-16 NOTE — ED Provider Notes (Signed)
[facility] EMERGENCY DEPARTMENT Provider Note   CSN: [id number] Arrival date & time: 11/16/20  1150     History Chief Complaint  Patient presents with  . Hypotension    Gabriella Schmidt is a 40 y.o. female.  HPI   Patient is a 40 year old female with history of anxiety, depression, dizziness, hypertension, who presents the emergency department due to orthostasis.  Patient is followed by her PCP Dr. Pearline Cables.  She states that she has been on lisinopril 20 mg for more than 10 years.  She gave birth to a young girl about 5 months ago.  She states during her pregnancy her blood pressure was controlled but she began noticing that she was becoming more hypertensive after delivering her daughter.  She states that due to this, she was started on 25 mg chlorthalidone in addition to her lisinopril about 1 month ago.  She states that since starting this medication she has been feeling "off balance" as well as more lightheaded.  Though she has had the symptoms, she states that her blood pressures have been normal for the past month.  She was started on meclizine that she has been taking on a daily basis, sometimes as much as 4 times per day.  She states she had an episode of hypertension about 2 weeks ago and discussed this with her PCP and they determined to start having patient take her chlorthalidone first thing in the morning and take her lisinopril in the evening, whereas before she was taking both medications in the morning.  She states about 2 days ago she began noticing more lightheadedness and hypotension.  She went to urgent care this morning and was found to be hypotensive as well as orthostatic and was sent to the emergency department for further evaluation.  Patient states she has felt more fatigued and mildly short of breath, but otherwise has no complaints at this time.  No chest pain, abdominal pain, nausea, vomiting, urinary symptoms.     Past Medical History:  Diagnosis Date  . Anxiety   .  Asthma    Phreesia 09/01/2020  . Degenerative cervical disc   . Depression    Phreesia 09/01/2020  . Dizziness   . Edema   . Hypertension     Patient Active Problem List   Diagnosis Date Noted  . Liver function test abnormality 10/06/2020  . Hyperlipidemia 10/06/2020  . Dizziness 10/06/2020  . Degenerative disc disease at L5-S1 level 10/06/2020  . Asthma 09/01/2020  . Chronic hypertension 01/31/2020  . Depression with anxiety 01/31/2020    Past Surgical History:  Procedure Laterality Date  . TUBAL LIGATION N/A 06/29/2020   Procedure: POST PARTUM TUBAL LIGATION;  Surgeon: Osborne Oman, MD;  Location: [facility] LD ORS;  Service: Gynecology;  Laterality: N/A;     OB History    Gravida  1   Para  1   Term      Preterm  1   AB      Living  1     SAB      IAB      Ectopic      Multiple  0   Live Births  1           Family History  Problem Relation Age of Onset  . Hypertension Mother   . Hypertension Father   . Cardiomyopathy Father   . Hypertension Maternal Grandmother   . Hypertension Maternal Grandfather     Social History   Tobacco  Use  . Smoking status: Former Smoker  . Smokeless tobacco: Never Used  Vaping Use  . Vaping Use: Former  Substance Use Topics  . Alcohol use: Not Currently  . Drug use: Never    Home Medications Prior to Admission medications   Medication Sig Start Date End Date Taking? Authorizing Provider  albuterol (VENTOLIN HFA) 108 (90 Base) MCG/ACT inhaler Inhale 2 puffs into the lungs every 6 (six) hours as needed for wheezing or shortness of breath. 05/22/20  Yes Cresenzo-Dishmon, Joaquim Lai, CNM  chlorthalidone (HYGROTON) 25 MG tablet TAKE 1 TABLET(25 MG) BY MOUTH DAILY Patient taking differently: Take 25 mg by mouth daily. 10/20/20  Yes Noreene Larsson, NP  lisinopril (ZESTRIL) 20 MG tablet Take 1 tablet (20 mg total) by mouth daily. 08/08/20  Yes Myrtis Ser, CNM  meclizine (ANTIVERT) 25 MG tablet Take 1 tablet (25 mg  total) by mouth every 6 (six) hours as needed for dizziness. 10/06/20  Yes Noreene Larsson, NP  Multiple Vitamin (MULTIVITAMIN) tablet Take 1 tablet by mouth daily.   Yes [provider]  sertraline (ZOLOFT) 100 MG tablet Take 1 tablet (100 mg total) by mouth daily. 08/08/20  Yes Myrtis Ser, CNM    Allergies    Latex  Review of Systems   Review of Systems  All other systems reviewed and are negative. Ten systems reviewed and are negative for acute change, except as noted in the HPI.   Physical Exam Updated Vital Signs BP (!) 97/58   Pulse 73   Temp 98 F (36.7 C) (Oral)   Resp (!) 22   Ht 5\' 5"  (1.651 m)   Wt 67.6 kg   LMP 10/31/2020   SpO2 100%   BMI 24.79 kg/m   Physical Exam Vitals and nursing note reviewed.  Constitutional:      General: She is not in acute distress.    Appearance: Normal appearance. She is not ill-appearing, toxic-appearing or diaphoretic.  HENT:     Head: Normocephalic and atraumatic.     Right Ear: External ear normal.     Left Ear: External ear normal.     Nose: Nose normal.     Mouth/Throat:     Mouth: Mucous membranes are moist.     Pharynx: Oropharynx is clear. No oropharyngeal exudate or posterior oropharyngeal erythema.  Eyes:     General: No scleral icterus.       Right eye: No discharge.        Left eye: No discharge.     Extraocular Movements: Extraocular movements intact.     Conjunctiva/sclera: Conjunctivae normal.  Cardiovascular:     Rate and Rhythm: Normal rate and regular rhythm.     Pulses: Normal pulses.     Heart sounds: Normal heart sounds. No murmur heard. No friction rub. No gallop.      Comments: Regular rate and rhythm without murmurs, rubs, or gallops. Pulmonary:     Effort: Pulmonary effort is normal. No respiratory distress.     Breath sounds: Normal breath sounds. No stridor. No wheezing, rhonchi or rales.  Abdominal:     General: Abdomen is flat.     Tenderness: There is no abdominal tenderness.   Musculoskeletal:        General: Normal range of motion.     Cervical back: Normal range of motion and neck supple. No tenderness.     Right lower leg: No edema.     Left lower leg: No edema.  Comments: No leg swelling.  Skin:    General: Skin is warm and dry.  Neurological:     General: No focal deficit present.     Mental Status: She is alert and oriented to person, place, and time.  Psychiatric:        Mood and Affect: Mood normal.        Behavior: Behavior normal.    ED Results / Procedures / Treatments   Labs (all labs ordered are listed, but only abnormal results are displayed) Labs Reviewed  COMPREHENSIVE METABOLIC PANEL - Abnormal; Notable for the following components:      Result Value   BUN 32 (*)    Creatinine, Ser 1.11 (*)    Total Protein 8.6 (*)    All other components within normal limits  CBC WITH DIFFERENTIAL/PLATELET - Abnormal; Notable for the following components:   Hemoglobin 11.7 (*)    All other components within normal limits  URINALYSIS, ROUTINE W REFLEX MICROSCOPIC - Abnormal; Notable for the following components:   APPearance CLOUDY (*)    Leukocytes,Ua LARGE (*)    Bacteria, UA FEW (*)    All other components within normal limits    EKG EKG Interpretation  Date/Time:  Sunday November 16 2020 13:23:04 EST Ventricular Rate:  81 PR Interval:    QRS Duration: 88 QT Interval:  398 QTC Calculation: 462 R Axis:   34 Text Interpretation: Normal sinus rhythm Low voltage, precordial leads No old tracing to compare Confirmed by Aletta Edouard [phone]) on 11/16/2020 4:03:23 PM   Radiology No results found.  Procedures Procedures   Medications Ordered in ED Medications  sodium chloride 0.9 % bolus 1,000 mL (0 mLs Intravenous Stopped 11/16/20 1522)  sodium chloride 0.9 % bolus 500 mL (0 mLs Intravenous Stopped 11/16/20 1702)    ED Course  I have reviewed the triage vital signs and the nursing notes.  Pertinent labs & imaging results that  were available during my care of the patient were reviewed by me and considered in my medical decision making (see chart for details).  Clinical Course as of 11/16/20 1709  Sun Nov 16, 2020  1345 Hemoglobin(!): 11.7 Stable when compared to prior values. [LJ]    Clinical Course User Index [LJ] Rayna Sexton, PA-C   MDM Rules/Calculators/A&P                          Pt is a 40 y.o. female who presents the emergency department due to hypertension and orthostatic vital signs at urgent care.  Labs: CBC with a hemoglobin of 11.7 which appears stable compared to prior values. CMP with a BUN of 32, creatinine of 1.11, total protein of 8.6. UA showing large leukocytes, 21-50 white blood cells, few bacteria.  I, Rayna Sexton, PA-C, personally reviewed and evaluated these images and lab results as part of my medical decision-making.  Orthostatic vital signs after IVF: Orthostatic Lying  BP- Lying:95/47 Pulse- Lying:75 Orthostatic Sitting BP- Sitting:99/61 Pulse- Sitting:73 Orthostatic Standing at 0 minutes BP- Standing at 0 minutes:98/53 Pulse- Standing at 0 minutes:72   Patient initially presented to the emergency department mildly hypotensive.  Patient given a liter of fluids and her orthostasis appears to have resolved.  She was given another 500 cc and states that she is feeling much better.  States she does not feel as lightheaded as before.  Blood pressure is now within normal limits.  We discussed her dosing and timing of both chlorthalidone  as well as lisinopril in length.  Recommended that she monitor her blood pressure this evening and if her blood pressure is within normal limits or only mildly elevated, that she refrain from taking her evening dose of lisinopril.  She is going to follow-up with her PCP tomorrow morning regarding her hypertension medications.    Lab work is generally reassuring.  Electrolytes within normal limits.  Hemoglobin stable at 11.7.  We  discussed return precautions.  Feel the patient is stable for discharge and she is agreeable.  Her questions were answered and she was amicable at the time of discharge.  Note: Portions of this report may have been transcribed using voice recognition software. Every effort was made to ensure accuracy; however, inadvertent computerized transcription errors may be present.    Final Clinical Impression(s) / ED Diagnoses Final diagnoses:  Orthostatic hypotension    Rx / DC Orders ED Discharge Orders    None       Rayna Sexton, PA-C 11/16/20 1710    Milton Ferguson, MD 11/17/20 1729

## 2020-11-16 NOTE — Discharge Instructions (Signed)
Like we discussed, please monitor your blood pressure this evening.  If it appears within normal limits or is only slightly elevated, I would recommend that you refrain from taking your evening dose of lisinopril.  Please follow-up with your regular doctor tomorrow morning regarding your hypertension medications.  They will likely want to adjust the dosing or timing of these medications.  If you develop any new or worsening symptoms, please return to the emergency department immediately for reevaluation.  It was a pleasure to meet you.

## 2020-12-19 ENCOUNTER — Other Ambulatory Visit: Payer: Self-pay | Admitting: Advanced Practice Midwife

## 2020-12-20 ENCOUNTER — Other Ambulatory Visit: Payer: Self-pay | Admitting: Advanced Practice Midwife

## 2020-12-24 ENCOUNTER — Telehealth: Payer: Self-pay

## 2020-12-24 NOTE — Telephone Encounter (Signed)
You have never filled this med. Ok to refill?

## 2020-12-24 NOTE — Telephone Encounter (Signed)
Sure

## 2020-12-24 NOTE — Telephone Encounter (Signed)
Patient needing refill on zoloft ph # [phone]

## 2020-12-25 ENCOUNTER — Other Ambulatory Visit: Payer: Self-pay

## 2020-12-25 MED ORDER — SERTRALINE HCL 100 MG PO TABS: 100.0000 mg | ORAL_TABLET | Freq: Every day | ORAL | 3 refills | Status: DC

## 2020-12-25 NOTE — Telephone Encounter (Signed)
Rx sent in

## 2021-01-05 ENCOUNTER — Ambulatory Visit: Admitting: Nurse Practitioner

## 2021-01-06 ENCOUNTER — Ambulatory Visit: Admitting: Nurse Practitioner

## 2021-01-12 ENCOUNTER — Ambulatory Visit: Admitting: Women's Health

## 2021-01-14 ENCOUNTER — Ambulatory Visit (INDEPENDENT_AMBULATORY_CARE_PROVIDER_SITE_OTHER): Admitting: Nurse Practitioner

## 2021-01-14 ENCOUNTER — Encounter: Payer: Self-pay | Admitting: Women's Health

## 2021-01-14 ENCOUNTER — Encounter: Payer: Self-pay | Admitting: Nurse Practitioner

## 2021-01-14 ENCOUNTER — Ambulatory Visit (INDEPENDENT_AMBULATORY_CARE_PROVIDER_SITE_OTHER): Admitting: Women's Health

## 2021-01-14 ENCOUNTER — Other Ambulatory Visit: Payer: Self-pay

## 2021-01-14 VITALS — BP 129/79 | HR 88 | Ht 65.0 in | Wt 151.5 lb

## 2021-01-14 DIAGNOSIS — M545 Low back pain, unspecified: Secondary | ICD-10-CM | POA: Diagnosis not present

## 2021-01-14 DIAGNOSIS — F418 Other specified anxiety disorders: Secondary | ICD-10-CM | POA: Diagnosis not present

## 2021-01-14 DIAGNOSIS — G4700 Insomnia, unspecified: Secondary | ICD-10-CM

## 2021-01-14 DIAGNOSIS — Z3202 Encounter for pregnancy test, result negative: Secondary | ICD-10-CM | POA: Diagnosis not present

## 2021-01-14 DIAGNOSIS — I10 Essential (primary) hypertension: Secondary | ICD-10-CM

## 2021-01-14 DIAGNOSIS — M549 Dorsalgia, unspecified: Secondary | ICD-10-CM | POA: Insufficient documentation

## 2021-01-14 DIAGNOSIS — G8929 Other chronic pain: Secondary | ICD-10-CM

## 2021-01-14 DIAGNOSIS — N920 Excessive and frequent menstruation with regular cycle: Secondary | ICD-10-CM | POA: Diagnosis not present

## 2021-01-14 LAB — POCT URINE PREGNANCY: Preg Test, Ur: NEGATIVE

## 2021-01-14 MED ORDER — TRAZODONE HCL 50 MG PO TABS: 25.0000 mg | ORAL_TABLET | Freq: Every evening | ORAL | 3 refills | Status: DC | PRN

## 2021-01-14 MED ORDER — SERTRALINE HCL 100 MG PO TABS
150.0000 mg | ORAL_TABLET | Freq: Every day | ORAL | 1 refills | Status: DC
Start: 2021-01-14 — End: 2021-03-25

## 2021-01-14 MED ORDER — LEVONORGEST-ETH ESTRAD 91-DAY 0.15-0.03 &0.01 MG PO TABS: 1.0000 | ORAL_TABLET | Freq: Every day | ORAL | 4 refills | Status: DC

## 2021-01-14 NOTE — Progress Notes (Signed)
   GYN VISIT Patient name: Gabriella Schmidt MRN [id number]  Date of birth: [birth year] Chief Complaint:   wants to start birth control pills  History of Present Illness:   Gabriella Schmidt is a 40 y.o. [id number] Caucasian female being seen today to get on birth control for period management. Had BTL in Oct. H/O heavy painful periods and ovarian cysts, did well on generic seasonique in past. Has CHTN and BP was elevated on 10/28/20 visit, so she has been working w/ PCP, and has actually had some hypotensive episodes. Does not smoke, no h/o DVT/PE, CVA, MI, or migraines w/ aura.  Depression screen [facility] 2/9 10/29/2020 10/06/2020 09/01/2020 09/01/2020 04/24/2020  Decreased Interest 0 0 0 0 0  Down, Depressed, Hopeless 0 0 1 1 1   PHQ - 2 Score 0 0 1 1 1   Altered sleeping 3 0 0 - 1  Tired, decreased energy 3 0 3 - 0  Change in appetite 0 0 0 - 0  Feeling bad or failure about yourself  0 0 0 - 0  Trouble concentrating 1 0 0 - 0  Moving slowly or fidgety/restless 0 0 2 - 0  Suicidal thoughts 0 0 0 - 0  PHQ-9 Score 7 0 6 - 2  Difficult doing work/chores Not difficult at all - Not difficult at all - -    Patient's last menstrual period was 01/01/2021. The current method of family planning is tubal ligation.  Last pap 02/21/20. Results were: NILM w/ HRHPV negative Review of Systems:   Pertinent items are noted in HPI Denies fever/chills, dizziness, headaches, visual disturbances, fatigue, shortness of breath, chest pain, abdominal pain, vomiting, abnormal vaginal discharge/itching/odor/irritation, problems with periods, bowel movements, urination, or intercourse unless otherwise stated above.  Pertinent History Reviewed:  Reviewed past medical,surgical, social, obstetrical and family history.  Reviewed problem list, medications and allergies. Physical Assessment:   Vitals:   01/14/21 1127  BP: 129/79  Pulse: 88  Weight: 151 lb 8 oz (68.7 kg)  Height: 5\' 5"  (1.651 m)  Body mass index is 25.21 kg/m.        Physical Examination:   General appearance: alert, well appearing, and in no distress  Mental status: alert, oriented to person, place, and time  Skin: warm & dry   Cardiovascular: normal heart rate noted  Respiratory: normal respiratory effort, no distress  Abdomen: soft, non-tender   Pelvic: examination not indicated  Extremities: no edema   Chaperone: N/A    Results for orders placed or performed in visit on 01/14/21 (from the past 24 hour(s))  POCT urine pregnancy   Collection Time: 01/14/21 11:32 AM  Result Value Ref Range   Preg Test, Ur Negative Negative    Assessment & Plan:  1) Heavy painful periods> rx Seasonique per request, did well w/ this in past, f/u 70mths  2) CHTN> controlled on meds  Meds:  Meds ordered this encounter  Medications  . Levonorgestrel-Ethinyl Estradiol (SEASONIQUE) 0.15-0.03 &0.01 MG tablet    Sig: Take 1 tablet by mouth daily.    Dispense:  91 tablet    Refill:  4    Order Specific Question:   Supervising Provider    Answer:   Tania Ade H [[id number]]    Orders Placed This Encounter  Procedures  . POCT urine pregnancy    Return in about 3 months (around 04/15/2021) for med f/u, in person, CNM.  [place], [facility] 01/14/2021 12:03 PM

## 2021-01-14 NOTE — Assessment & Plan Note (Signed)
-  GAD-7 = 14 today -INCREASE sertraline to 150 mg today

## 2021-01-14 NOTE — Patient Instructions (Signed)
Oral Contraception Use Oral contraceptive pills (OCPs) are medicines that prevent pregnancy. OCPs work by:  Preventing the ovaries from releasing eggs.  Thickening mucus in the lower part of the uterus (cervix). This prevents sperm from entering the uterus.  Thinning the lining of the uterus (endometrium). This prevents a fertilized egg from attaching to the endometrium. Discuss possible side effects of OCPs with your health care provider. It can take 2-3 months for your body to adjust to changes in hormone levels. What are the risks? OCPs can sometimes cause side effects, such as:  Headache.  Depression.  Trouble sleeping.  Nausea and vomiting.  Breast tenderness.  Irregular bleeding or spotting during the first several months.  Bloating or fluid retention.  Increase in blood pressure. OCPs with estrogen and progestins may slightly increase the risk of:  Blood clots.  Heart attack.  Stroke. How to take OCPs Follow instructions from your health care provider about how to take your first cycle of OCPs. There are 2 types of OCPs. The first, combination OCPs, have both estrogen and progestins. The second, progestin-only pills, have only progestin.  For combination OCPs, you may start the pill: ? On day 1 of your menstrual period. ? On the first Sunday after your period starts, or on the day you get your prescription. ? At any time of your cycle. ? If you start taking the pill within 5 days after the start of your period, you will not need a backup form of birth control, such as condoms. ? If you start at any other time of your menstrual cycle, you will need to use a backup form of birth control.  For progestin-only OCPs: ? Ideally, you can start taking the pill on the first day of your menstrual period, but you can start it on any other day too. ? These pills will protect you from pregnancy after taking it for 2 days (48 hours). You can stop using a backup form of birth  control after that time. It is important that you take this pill at the same time every day. Even taking it 3 hours late can increase the risk of pregnancy. No matter which day you start the OCP, you will always start a new pack on that same day of the week. Have an extra pack of OCPs and a backup contraceptive method available in case you miss some pills or lose your OCP pack. Missed doses Follow instructions from your health care provider for missed doses. Information about missed doses can also be found in the patient information sheet that comes with your pack of pills.  In general, for combined OCPs: ? If you forget to take the pill for 1 day, take it as soon as you can. This may mean taking 2 pills on the same day and at the same time. Take the next day's pill at the regular time. ? If you forget to take the pill for 2 days in a row, take 2 tablets on the day you remember and 2 tablets on the following day. A backup form of birth control should be used for 7 days after you are back on schedule. ? If you forget to take the pill for 3 days in a row, call your health care provider for directions on when to restart taking your pills. Do not take the missed pills. A backup form of birth control will be needed for 7 days once you restart your pills. ? If you use a pack that  contains inactive pills and you miss 1 or more of the inactive pills, you do not need to take the missed doses. Skip them and start the new pack on the regular day.  For progestin-only OCPs: ? If your dose is 3 hours or more late, or if you miss 1 or more doses, take 1 missed pill as soon as you can. ? If you miss one or more doses, you must use a backup form of birth control. Some brands of progestin-only pills recommend using a backup form of birth control for 48 hours after a missed or late dose while others recommend 7 days. If you are not sure what to do, call your health care provider or check the patient information sheet that  came with your pills.   Follow these instructions at home:  Do not use any products that contain nicotine or tobacco. These include cigarettes, chewing tobacco, or vaping devices, such as e-cigarettes. If you need help quitting, ask your health care provider.  Always use a condom to protect against STIs (sexually transmitted infections). Oral contraception pills do not protect against STIs.  Use a calendar to mark the days of your menstrual period.  Read the information sheet and directions that came with your OCP. Talk to your health care provider if you have questions. Contact a health care provider if:  You develop nausea and vomiting.  You have abnormal vaginal discharge or bleeding.  You develop a rash.  You miss your menstrual period. Depending on the type of OCP you are taking, this may be a sign of pregnancy.  You are losing your hair.  You need treatment for mood swings or depression.  You get dizzy when taking the OCP.  You develop acne after taking the OCP.  You become pregnant or think you may be pregnant.  You have diarrhea, constipation, and abdominal pain or cramps.  You are not sure what to do after missing pills. Get help right away if:  You develop chest pain.  You develop shortness of breath.  You have an uncontrolled or severe headache.  You develop numbness or slurred speech.  You develop vision or speech problems.  You develop pain, redness, and swelling in your legs.  You develop weakness or numbness in your arms or legs. These symptoms may represent a serious problem that is an emergency. Do not wait to see if the symptoms will go away. Get medical help right away. Call your local emergency services (911 in the [place] Do not drive yourself to the hospital. Summary  Oral contraceptive pills (OCPs) are medicines that you take to prevent pregnancy.  OCPs do not prevent sexually transmitted infections (STIs). Always use a condom to protect  against STIs.  When you start an OCP, be aware that it can take 2-3 months for your body to adjust to changes in hormone levels.  Read all the information and directions that come with your OCP. This information is not intended to replace advice given to you by your health care provider. Make sure you discuss any questions you have with your health care provider. Document Revised: 05/15/2020 Document Reviewed: 05/15/2020 Elsevier Patient Education  [place].

## 2021-01-14 NOTE — Assessment & Plan Note (Signed)
-  Rx. Trazodone -she states trazodone worked in the past, but may have been on the 150 mg dosage

## 2021-01-14 NOTE — Patient Instructions (Signed)
Please have fasting labs drawn 2-3 days prior to your appointment so we can discuss the results during your office visit.  
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
## 2021-01-14 NOTE — Assessment & Plan Note (Signed)
-  she stopped BP meds after going to ED for dizziness -dizziness improved without BP meds BP Readings from Last 3 Encounters:  01/14/21 128/83  01/14/21 129/79  11/16/20 (!) 97/58   -BP well controlled without meds
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
## 2021-02-04 ENCOUNTER — Encounter: Payer: Self-pay | Admitting: Physical Medicine & Rehabilitation

## 2021-02-09 ENCOUNTER — Encounter (INDEPENDENT_AMBULATORY_CARE_PROVIDER_SITE_OTHER): Payer: Self-pay

## 2021-02-12 LAB — CMP14+EGFR
ALT: 15 IU/L (ref 0–32)
AST: 13 IU/L (ref 0–40)
Albumin/Globulin Ratio: 1.4 (ref 1.2–2.2)
Albumin: 4.1 g/dL (ref 3.8–4.8)
Alkaline Phosphatase: 67 IU/L (ref 44–121)
BUN/Creatinine Ratio: 14 (ref 9–23)
BUN: 11 mg/dL (ref 6–20)
Bilirubin Total: 0.4 mg/dL (ref 0.0–1.2)
CO2: 22 mmol/L (ref 20–29)
Calcium: 8.3 mg/dL — ABNORMAL LOW (ref 8.7–10.2)
Chloride: 103 mmol/L (ref 96–106)
Creatinine, Ser: 0.78 mg/dL (ref 0.57–1.00)
Globulin, Total: 2.9 g/dL (ref 1.5–4.5)
Glucose: 82 mg/dL (ref 65–99)
Potassium: 4 mmol/L (ref 3.5–5.2)
Sodium: 142 mmol/L (ref 134–144)
Total Protein: 7 g/dL (ref 6.0–8.5)
eGFR: 100 mL/min/{1.73_m2} (ref >59)

## 2021-02-12 LAB — CBC WITH DIFFERENTIAL/PLATELET
Basophils Absolute: 0.1 10*3/uL (ref 0.0–0.2)
Basos: 1 %
EOS (ABSOLUTE): 0.1 10*3/uL (ref 0.0–0.4)
Eos: 2 %
Hematocrit: 33.2 % — ABNORMAL LOW (ref 34.0–46.6)
Hemoglobin: 10.7 g/dL — ABNORMAL LOW (ref 11.1–15.9)
Immature Grans (Abs): 0 10*3/uL (ref 0.0–0.1)
Immature Granulocytes: 0 %
Lymphocytes Absolute: 2.3 10*3/uL (ref 0.7–3.1)
Lymphs: 29 %
MCH: 27.5 pg (ref 26.6–33.0)
MCHC: 32.2 g/dL (ref 31.5–35.7)
MCV: 85 fL (ref 79–97)
Monocytes Absolute: 0.4 10*3/uL (ref 0.1–0.9)
Monocytes: 5 %
Neutrophils Absolute: 5.1 10*3/uL (ref 1.4–7.0)
Neutrophils: 63 %
Platelets: 313 10*3/uL (ref 150–450)
RBC: 3.89 x10E6/uL (ref 3.77–5.28)
RDW: 12 % (ref 11.7–15.4)
WBC: 7.9 10*3/uL (ref 3.4–10.8)

## 2021-02-12 LAB — LIPID PANEL WITH LDL/HDL RATIO
Cholesterol, Total: 169 mg/dL (ref 100–199)
HDL: 48 mg/dL (ref >39)
LDL Chol Calc (NIH): 93 mg/dL (ref 0–99)
LDL/HDL Ratio: 1.9 ratio (ref 0.0–3.2)
Triglycerides: 162 mg/dL — ABNORMAL HIGH (ref 0–149)
VLDL Cholesterol Cal: 28 mg/dL (ref 5–40)

## 2021-02-12 NOTE — Progress Notes (Signed)
Her hemoglobin is a little low. Has she had any blood in her stools or heavy menstruation?

## 2021-02-18 ENCOUNTER — Ambulatory Visit (INDEPENDENT_AMBULATORY_CARE_PROVIDER_SITE_OTHER): Admitting: Nurse Practitioner

## 2021-02-18 ENCOUNTER — Encounter: Payer: Self-pay | Admitting: Nurse Practitioner

## 2021-02-18 ENCOUNTER — Other Ambulatory Visit: Payer: Self-pay

## 2021-02-18 DIAGNOSIS — F418 Other specified anxiety disorders: Secondary | ICD-10-CM

## 2021-02-18 DIAGNOSIS — G4700 Insomnia, unspecified: Secondary | ICD-10-CM

## 2021-02-18 DIAGNOSIS — I10 Essential (primary) hypertension: Secondary | ICD-10-CM

## 2021-02-18 MED ORDER — TRAZODONE HCL 150 MG PO TABS: 150.0000 mg | ORAL_TABLET | Freq: Every evening | ORAL | 1 refills | Status: DC | PRN

## 2021-02-18 NOTE — Assessment & Plan Note (Signed)
-  tried [place] in the past, and that had adverse side effects like calling people in the middle of the night and not remembering -INCREASE trazodone to 150 mg -If no improvement, may consider seroquel, remeron, or dayvigo -she has 69 month-old daughter at home, and  she is contributing to pt's poor sleep

## 2021-02-18 NOTE — Assessment & Plan Note (Signed)
-  doing well today without medication

## 2021-02-18 NOTE — Progress Notes (Signed)
Acute Office Visit  Subjective:    Patient ID: Gabriella Schmidt, female    DOB: [birth year], 40 y.o.   MRN: [id number]  Chief Complaint  Patient presents with  . Anxiety    Doing better.   . Insomnia    Still having trouble falling and staying asleep.     HPI Patient is in today for med check. At her last OV, we increased her sertraline to 150 mg and her GAD-7 was 14 at that time.  She also had insomnia at her last OV, and stated that trazodone had worked for her in the past, so we started her on 50 mg with intentions of titrating up as needed. She started taking 2 tabs per night, and that didn't help much.  She estimates that she gets 5 hours of broken sleep per night.  Past Medical History:  Diagnosis Date  . Anxiety   . Asthma    Phreesia 09/01/2020  . Degenerative cervical disc   . Depression    Phreesia 09/01/2020  . Dizziness   . Edema   . Hypertension     Past Surgical History:  Procedure Laterality Date  . TUBAL LIGATION N/A 06/29/2020   Procedure: POST PARTUM TUBAL LIGATION;  Surgeon: Osborne Oman, MD;  Location: [facility];  Service: Gynecology;  Laterality: N/A;    Family History  Problem Relation Age of Onset  . Hypertension Mother   . Hypertension Father   . Cardiomyopathy Father   . Hypertension Maternal Grandmother   . Hypertension Maternal Grandfather     Social History   Socioeconomic History  . Marital status: Married    Spouse name: Allyiah Gartner  . Number of children: Not on file  . Years of education: Not on file  . Highest education level: Not on file  Occupational History  . Not on file  Tobacco Use  . Smoking status: Former Research scientist (life sciences)  . Smokeless tobacco: Never Used  Vaping Use  . Vaping Use: Former  Substance and Sexual Activity  . Alcohol use: Not Currently  . Drug use: Never  . Sexual activity: Yes    Birth control/protection: Surgical    Comment: tubal  Other Topics Concern  . Not on file  Social History Narrative  .  Not on file   Social Determinants of Health   Financial Resource Strain: Low Risk   . Difficulty of Paying Living Expenses: Not very hard  Food Insecurity: No Food Insecurity  . Worried About Charity fundraiser in the Last Year: Never true  . Ran Out of Food in the Last Year: Never true  Transportation Needs: No Transportation Needs  . Lack of Transportation (Medical): No  . Lack of Transportation (Non-Medical): No  Physical Activity: Insufficiently Active  . Days of Exercise per Week: 2 days  . Minutes of Exercise per Session: 10 min  Stress: Stress Concern Present  . Feeling of Stress : To some extent  Social Connections: Moderately Integrated  . Frequency of Communication with Friends and Family: More than three times a week  . Frequency of Social Gatherings with Friends and Family: Three times a week  . Attends Religious Services: More than 4 times per year  . Active Member of Clubs or Organizations: No  . Attends Archivist Meetings: Never  . Marital Status: Married  Human resources officer Violence: Not At Risk  . Fear of Current or Ex-Partner: No  . Emotionally Abused: No  . Physically Abused: No  .  Sexually Abused: No    Outpatient Medications Prior to Visit  Medication Sig Dispense Refill  . albuterol (VENTOLIN HFA) 108 (90 Base) MCG/ACT inhaler Inhale 2 puffs into the lungs every 6 (six) hours as needed for wheezing or shortness of breath. 8 g 2  . Levonorgestrel-Ethinyl Estradiol (SEASONIQUE) 0.15-0.03 &0.01 MG tablet Take 1 tablet by mouth daily. 91 tablet 4  . sertraline (ZOLOFT) 100 MG tablet Take 1.5 tablets (150 mg total) by mouth daily. 135 tablet 1  . traZODone (DESYREL) 50 MG tablet Take 0.5-1 tablets (25-50 mg total) by mouth at bedtime as needed for sleep. 30 tablet 3   No facility-administered medications prior to visit.    Allergies  Allergen Reactions  . Latex Rash    Review of Systems     Objective:    Physical Exam  BP 127/79   Pulse  (!) 20   Temp 99.2 F (37.3 C)   Resp 18   Ht _0  (1.651 m)   Wt 152 lb (68.9 kg)   SpO2 96%   BMI 25.29 kg/m  Wt Readings from Last 3 Encounters:  02/18/21 152 lb (68.9 kg)  01/14/21 150 lb (68 kg)  01/14/21 151 lb 8 oz (68.7 kg)    Health Maintenance Due  Topic Date Due  . COVID-19 Vaccine (3 - Booster for Moderna series) 09/18/2020    There are no preventive care reminders to display for this patient.   No results found for: TSH Lab Results  Component Value Date   WBC 7.9 02/11/2021   HGB 10.7 (L) 02/11/2021   HCT 33.2 (L) 02/11/2021   MCV 85 02/11/2021   PLT 313 02/11/2021   Lab Results  Component Value Date   NA 142 02/11/2021   K 4.0 02/11/2021   CO2 22 02/11/2021   GLUCOSE 82 02/11/2021   BUN 11 02/11/2021   CREATININE 0.78 02/11/2021   BILITOT 0.4 02/11/2021   ALKPHOS 67 02/11/2021   AST 13 02/11/2021   ALT 15 02/11/2021   PROT 7.0 02/11/2021   ALBUMIN 4.1 02/11/2021   CALCIUM 8.3 (L) 02/11/2021   ANIONGAP 7 11/16/2020   EGFR 100 02/11/2021   Lab Results  Component Value Date   CHOL 169 02/11/2021   Lab Results  Component Value Date   HDL 48 02/11/2021   Lab Results  Component Value Date   LDLCALC 93 02/11/2021   Lab Results  Component Value Date   TRIG 162 (H) 02/11/2021   No results found for: CHOLHDL No results found for: HGBA1C     Assessment & Plan:   Problem List Items Addressed This Visit      Cardiovascular and Mediastinum   Chronic hypertension    -doing well today without medication        Other   Depression with anxiety    -GAD-7 = 2 today (last 14) -continue sertraline 150 mg daily      Relevant Medications   traZODone (DESYREL) 150 MG tablet   Insomnia    -tried ambien in the past, and that had adverse side effects like calling people in the middle of the night and not remembering -INCREASE trazodone to 150 mg -If no improvement, may consider seroquel, remeron, or dayvigo -she has 88 month-old daughter  at home, and  she is contributing to pt's poor sleep        Relevant Medications   traZODone (DESYREL) 150 MG tablet       Meds ordered this encounter  Medications  . traZODone (DESYREL) 150 MG tablet    Sig: Take 1 tablet (150 mg total) by mouth at bedtime as needed for sleep.    Dispense:  30 tablet    Refill:  1    Increased dose today     Noreene Larsson, NP

## 2021-02-18 NOTE — Assessment & Plan Note (Signed)
-  GAD-7 = 2 today (last 14) -continue sertraline 150 mg daily

## 2021-03-19 ENCOUNTER — Other Ambulatory Visit: Payer: Self-pay

## 2021-03-19 ENCOUNTER — Encounter: Attending: Physical Medicine & Rehabilitation | Admitting: Physical Medicine & Rehabilitation

## 2021-03-19 ENCOUNTER — Encounter: Payer: Self-pay | Admitting: Physical Medicine & Rehabilitation

## 2021-03-19 VITALS — BP 125/79 | HR 78 | Temp 98.8°F | Ht 65.0 in | Wt 153.2 lb

## 2021-03-19 DIAGNOSIS — G894 Chronic pain syndrome: Secondary | ICD-10-CM | POA: Diagnosis not present

## 2021-03-19 MED ORDER — GABAPENTIN 300 MG PO CAPS: 300.0000 mg | ORAL_CAPSULE | Freq: Three times a day (TID) | ORAL | 1 refills | Status: DC

## 2021-03-19 NOTE — Progress Notes (Signed)
Subjective:    Patient ID: Gabriella Schmidt, female    DOB: 1980-12-31, 40 y.o.   MRN: [id number]  HPI CC:  Tailbone pain  40 year old female who has history of chronic low back pain started when she was a teenager.  She had been on Darvocet when she was as young is 40 years old.  More recently the patient has been seeing pain management in [facility] prior to moving to [place] in 2021.  She had a multipronged approach including injections and oral medications.  She has had physical therapy in the past without much benefit.  The patient has tried tramadol for pain which made her sick.  She had been on hydrocodone up to 4 tablets/day but then was weaned down to 2 tablets a day and then once she became pregnant this was discontinued.  She had some relief with gabapentin 300 mg 3 times daily. Her pain is primarily in the low back area since her pregnancy which included "back labor" she has had more tailbone pain.  She occasionally has some pain going down the right lower extremity. She has had no recent falls or trauma.  She has had no recent imaging studies.  She had imaging studies in [facility].  In regards to injections she remembers little detail.  There are no records from [facility] to review. Epidural - worked 1 st time but not second Sacroiliac- not sure about result Not sure about facet injections or medial branch blocks but did not have radiofrequency neurotomy.  Was offered Spinal cord stim but pt declined  The patient is independent with all self-care and mobility including the care of a child.  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 She is not active exercising on a regular basis.   Pain Inventory Average Pain 6 Pain Right Now 7 My pain is sharp, burning, stabbing, and aching  In the last 24 hours, has pain interfered with the following? General activity 7 Relation with others 4 Enjoyment of life 9 What TIME of day is your pain at its worst? daytime,  evening, and night Sleep (in general) Fair  Pain is worse with: walking, bending, sitting, standing, and some activites Pain improves with: medication and injections Relief from Meds: 8  walk without assistance how many minutes can you walk? 15 mins ability to climb steps?  yes do you drive?  yes  employed # of hrs/week 16  weakness tingling spasms depression anxiety  N/a  N/a    Family History  Problem Relation Age of Onset   Hypertension Mother    Hypertension Father    Cardiomyopathy Father    Hypertension Maternal Grandmother    Hypertension Maternal Grandfather    Social History   Socioeconomic History   Marital status: Married    Spouse name: Gabriella Schmidt   Number of children: Not on file   Years of education: Not on file   Highest education level: Not on file  Occupational History   Not on file  Tobacco Use   Smoking status: Former    Pack years: 0.00   Smokeless tobacco: Never  Vaping Use   Vaping Use: Former  Substance and Sexual Activity   Alcohol use: Not Currently   Drug use: Never   Sexual activity: Yes    Birth control/protection: Surgical    Comment: tubal  Other Topics Concern   Not on file  Social History Narrative   Not on file   Social Determinants  of Health   Financial Resource Strain: Low Risk    Difficulty of Paying Living Expenses: Not very hard  Food Insecurity: No Food Insecurity   Worried About Running Out of Food in the Last Year: Never true   Ran Out of Food in the Last Year: Never true  Transportation Needs: No Transportation Needs   Lack of Transportation (Medical): No   Lack of Transportation (Non-Medical): No  Physical Activity: Insufficiently Active   Days of Exercise per Week: 2 days   Minutes of Exercise per Session: 10 min  Stress: Stress Concern Present   Feeling of Stress : To some extent  Social Connections: Moderately Integrated   Frequency of Communication with Friends and Family: More than three  times a week   Frequency of Social Gatherings with Friends and Family: Three times a week   Attends Religious Services: More than 4 times per year   Active Member of Clubs or Organizations: No   Attends Archivist Meetings: Never   Marital Status: Married   Past Surgical History:  Procedure Laterality Date   TUBAL LIGATION N/A 06/29/2020   Procedure: POST PARTUM TUBAL LIGATION;  Surgeon: Gabriella Oman, MD;  Location: [facility] LD ORS;  Service: Gynecology;  Laterality: N/A;   Past Medical History:  Diagnosis Date   Anxiety    Asthma    Phreesia 09/01/2020   Degenerative cervical disc    Depression    Phreesia 09/01/2020   Dizziness    Edema    Hypertension    BP 125/79 (BP Location: Right Arm, Patient Position: Sitting, Cuff Size: Large)   Pulse 78   Temp 98.8 F (37.1 C) (Oral)   Ht 5\' 5"  (1.651 m)   Wt 153 lb 3.2 oz (69.5 kg)   SpO2 98%   BMI 25.49 kg/m   Opioid Risk Score:   Fall Risk Score:  `1  Depression screen PHQ 2/9  Depression screen [facility] 2/9 02/18/2021 01/14/2021 10/29/2020 10/06/2020 09/01/2020 09/01/2020 04/24/2020  Decreased Interest 0 0 0 0 0 0 0  Down, Depressed, Hopeless 0 0 0 0 1 1 1   PHQ - 2 Score 0 0 0 0 1 1 1   Altered sleeping - - 3 0 0 - 1  Tired, decreased energy - - 3 0 3 - 0  Change in appetite - - 0 0 0 - 0  Feeling bad or failure about yourself  - - 0 0 0 - 0  Trouble concentrating - - 1 0 0 - 0  Moving slowly or fidgety/restless - - 0 0 2 - 0  Suicidal thoughts - - 0 0 0 - 0  PHQ-9 Score - - 7 0 6 - 2  Difficult doing work/chores - - Not difficult at all - Not difficult at all - -      Review of Systems  Constitutional:  Positive for unexpected weight change.  HENT: Negative.    Eyes: Negative.   Respiratory: Negative.    Gastrointestinal:  Positive for constipation.  Endocrine: Negative.   Genitourinary: Negative.   Musculoskeletal:  Positive for back pain.       Spasm   Skin: Negative.   Allergic/Immunologic: Negative.    Neurological:  Positive for weakness.  Hematological: Negative.   Psychiatric/Behavioral:  Positive for dysphoric mood.       Objective:   Physical Exam Vitals and nursing note reviewed.  Constitutional:      Appearance: She is normal weight.  HENT:  Head: Normocephalic and atraumatic.  Eyes:     Extraocular Movements: Extraocular movements intact.     Pupils: Pupils are equal, round, and reactive to light.  Cardiovascular:     Rate and Rhythm: Normal rate and regular rhythm.     Heart sounds: Normal heart sounds. No murmur heard. Pulmonary:     Effort: Pulmonary effort is normal. No respiratory distress.     Breath sounds: Normal breath sounds.  Abdominal:     General: Abdomen is flat. Bowel sounds are normal. There is no distension.     Palpations: Abdomen is soft.  Musculoskeletal:        General: Tenderness present.     Cervical back: Normal range of motion. No tenderness.     Comments: Full range of motion bilateral upper and lower limb Full range of motion cervical spine 75% range with lumbar flexion and extension she does have endrange pain in both directions. Negative straight leg raising bilaterally Tenderness palpation over the greater trochanters as well as over the coccygeal area   Sacral thrust (prone) : Negative Lateral compression: Positive at the greater trochanters FABER's: Positive left hip area Distraction (supine): Negative Thigh thrust test negative   Skin:    General: Skin is warm and dry.  Neurological:     General: No focal deficit present.     Mental Status: She is alert and oriented to person, place, and time.     Cranial Nerves: No dysarthria.     Sensory: Sensation is intact.     Motor: Motor function is intact. No atrophy or abnormal muscle tone.     Coordination: Coordination is intact.     Gait: Gait is intact.     Deep Tendon Reflexes:     Reflex Scores:      Tricep reflexes are 2+ on the right side and 2+ on the left side.       Bicep reflexes are 2+ on the right side and 2+ on the left side.      Brachioradialis reflexes are 2+ on the right side and 2+ on the left side.      Patellar reflexes are 2+ on the right side and 2+ on the left side.      Achilles reflexes are 2+ on the right side and 2+ on the left side.    Comments: Motor strength 5/5 bilateral deltoid, bicep, tricep, grip, hip flexor, knee extensor, ankle dorsiflexor and plantar flexor  Normal sensation to touch bilateral upper and lower limbs with exception of right S1 dermatome is hypersensitive compared to the left side.  Psychiatric:        Mood and Affect: Mood normal.        Behavior: Behavior normal.   Right SLR equivocal does have pain in the back of the thigh and knee but not into the foot Majority of lumbar pain is at the lumbosacral junction      Assessment & Plan:  1.  Chronic low back, pelvic and hip pain.  There may be some sciatic symptoms down the right leg with some hypersensitivity in the S1 dermatomal distribution. Worse pain since pregnancy has been in the coccygeal area.  Recommend avoiding weightbearing over the coccygeal area.  We will check x-rays of the lumbosacral spine We discussed that she has likely several pain generators. History of lumbar degenerative disc suspect lumbosacral junction given her physical examination. In addition the patient has evidence of trochanteric bursitis Given recent pregnancy suspect sacroiliac dysfunction as a  pain generator and we will set her up with bilateral sacroiliac injections. In terms of narcotic analgesics we discussed overall strategies to minimize usage.  We will need to check urine drug screen for ongoing prescriptions lasting greater than 6 weeks. She has tried Tylenol with codeine and tolerated this well.  T #3 1 p.o. twice daily this may be an option for her if UDS looks okay. Will need to do opioid risk score as well PDQ 9 looks okay Patient may need lumbar medial branch blocks  if think really injections are not helpful. If sciatic pain worsens may consider epidural injections but would need to repeat lumbar MRI

## 2021-03-19 NOTE — Patient Instructions (Signed)
Sacroiliac Joint Dysfunction °Sacroiliac joint dysfunction is a condition that causes inflammation on one or both sides of the sacroiliac (SI) joint. The SI joint is the joint between two bones of the pelvis called the sacrum and the ilium. The sacrum is the bone at the base of the spine. The ilium is the large bone that forms the hip. This condition causes deep aching or burning pain in the low back. In some cases, the pain may also spread into one or both buttocks, hips, or thighs. °What are the causes? °This condition may be caused by: °Pregnancy. During pregnancy, extra stress is put on the SI joints because the pelvis widens. °Injury, such as: °Injuries from car crashes. °Sports-related injuries. °Work-related injuries. °Having one leg that is shorter than the other. °Conditions that affect the joints, such as: °Rheumatoid arthritis. °Gout. °Psoriatic arthritis. °Joint infection (septic arthritis). °Sometimes, the cause of SI joint dysfunction is not known. °What are the signs or symptoms? °Symptoms of this condition include: °Aching or burning pain in the lower back. The pain may also spread to other areas, such as: °Buttocks. °Groin. °Thighs. °Muscle spasms in or around the painful areas. °Increased pain when standing, walking, running, stair climbing, bending, or lifting. °How is this diagnosed? °This condition is diagnosed with a physical exam and your medical history. During the exam, the health care provider may move one or both of your legs to different positions to check for pain. Various tests may be done to confirm the diagnosis, including: °Imaging tests to look for other causes of pain. These may include: °MRI. °CT scan. °Bone scan. °Diagnostic injection. A numbing medicine is injected into the SI joint using a needle. If your pain is temporarily improved or stopped after the injection, this can indicate that SI joint dysfunction is the problem. °How is this treated? °Treatment depends on the cause  and severity of your condition. Treatment options can be noninvasive and may include: °Ice or heat applied to the lower back area after an injury. This may help reduce pain and muscle spasms. °Medicines to relieve pain or inflammation or to relax the muscles. °Wearing a back brace (sacroiliac brace) to help support the joint while your back is healing. °Physical therapy to increase muscle strength around the joint and flexibility at the joint. This may also involve learning proper body positions and ways of moving to relieve stress on the joint. °Direct manipulation of the SI joint. °Use of a device that provides electrical stimulation to help reduce pain at the joint. °Other treatments may include: °Injections of steroid medicine into the joint to reduce pain and swelling. °Radiofrequency ablation. This treatment uses heat to burn away nerves that are carrying pain messages from the joint. °Surgery to put in screws and plates that limit or prevent joint motion. This is rare. °Follow these instructions at home: °Medicines °Take over-the-counter and prescription medicines only as told by your health care provider. °Ask your health care provider if the medicine prescribed to you: °Requires you to avoid driving or using machinery. °Can cause constipation. You may need to take these actions to prevent or treat constipation: °Drink enough fluid to keep your urine pale yellow. °Take over-the-counter or prescription medicines. °Eat foods that are high in fiber, such as beans, whole grains, and fresh fruits and vegetables. °Limit foods that are high in fat and processed sugars, such as fried or sweet foods. °If you have a brace: °Wear the brace as told by your health care provider. Remove   it only as told by your health care provider. °Keep the brace clean. °If the brace is not waterproof: °Do not let it get wet. °Cover it with a watertight covering when you take a bath or a shower. °Managing pain, stiffness, and swelling °   °Icing can help with pain and swelling. Heat may help with muscle tension or spasms. Ask your health care provider if you should use ice or heat. °If directed, put ice on the affected area: °If you have a removable brace, remove it as told by your health care provider. °Put ice in a plastic bag. °Place a towel between your skin and the bag. °Leave the ice on for 20 minutes, 2-3 times a day. °Remove the ice if your skin turns bright red. This is very important. If you cannot feel pain, heat, or cold, you have a greater risk of damage to the area. °If directed, apply heat to the affected area as often as told by your health care provider. Use the heat source that your health care provider recommends, such as a moist heat pack or a heating pad. °Place a towel between your skin and the heat source. °Leave the heat on for 20-30 minutes. °Remove the heat if your skin turns bright red. This is especially important if you are unable to feel pain, heat, or cold. You may have a greater risk of getting burned. °General instructions °Rest as needed. Return to your normal activities as told by your health care provider. Ask your health care provider what activities are safe for you. °Do exercises as told by your health care provider or physical therapist. °Keep all follow-up visits. This is important. °Contact a health care provider if: °Your pain is not controlled with medicine. °You have a fever. °Your pain is getting worse. °Get help right away if: °You have weakness, numbness, or tingling in your legs or feet. °You lose control of your bladder or bowels. °Summary °Sacroiliac (SI) joint dysfunction is a condition that causes inflammation on one or both sides of the SI joint. °This condition causes deep aching or burning pain in the low back. In some cases, the pain may also spread into one or both buttocks, hips, or thighs. °Treatment depends on the cause and severity of your condition. It may include medicines to reduce  pain and swelling or to relax muscles. °This information is not intended to replace advice given to you by your health care provider. Make sure you discuss any questions you have with your health care provider. °Document Revised: 01/17/2020 Document Reviewed: 01/17/2020 °Elsevier Patient Education © 2022 Else[employer] Inc. ° °

## 2021-03-24 LAB — TOXASSURE SELECT,+ANTIDEPR,UR

## 2021-03-25 ENCOUNTER — Ambulatory Visit (INDEPENDENT_AMBULATORY_CARE_PROVIDER_SITE_OTHER): Admitting: Nurse Practitioner

## 2021-03-25 ENCOUNTER — Other Ambulatory Visit: Payer: Self-pay

## 2021-03-25 ENCOUNTER — Encounter: Payer: Self-pay | Admitting: Nurse Practitioner

## 2021-03-25 VITALS — BP 120/76 | HR 89 | Temp 97.7°F | Ht 65.0 in | Wt 154.0 lb

## 2021-03-25 DIAGNOSIS — G4700 Insomnia, unspecified: Secondary | ICD-10-CM | POA: Diagnosis not present

## 2021-03-25 DIAGNOSIS — F418 Other specified anxiety disorders: Secondary | ICD-10-CM

## 2021-03-25 MED ORDER — SERTRALINE HCL 100 MG PO TABS
200.0000 mg | ORAL_TABLET | Freq: Every day | ORAL | 1 refills | Status: DC
Start: 2021-03-25 — End: 2021-09-24

## 2021-03-25 NOTE — Assessment & Plan Note (Signed)
-  GAD-7 = 9 today; last was 2 -INCREASE sertraline to 200 mg; this was her dose prior to pregnancy

## 2021-03-25 NOTE — Assessment & Plan Note (Signed)
-  doing well -continue trazodone

## 2021-03-25 NOTE — Progress Notes (Signed)
Acute Office Visit  Subjective:    Patient ID: Gabriella Schmidt, female    DOB: December 13, 1980, 40 y.o.   MRN: [id number]  Chief Complaint  Patient presents with   Medication Refill    Med check follow up for Sertraline and Trazodone.     Medication Refill  Patient is in today for med check. At last OV, her sertraline was doing well for anxiety. We increased her trazodone to 150 mg for sleep. She has 57-monthold at home and that is interfering with her sleep.   Today, she states she feels like her anxiety is worse.   Past Medical History:  Diagnosis Date   Anxiety    Asthma    Phreesia 09/01/2020   Degenerative cervical disc    Depression    Phreesia 09/01/2020   Dizziness    Edema    Hypertension     Past Surgical History:  Procedure Laterality Date   TUBAL LIGATION N/A 06/29/2020   Procedure: POST PARTUM TUBAL LIGATION;  Surgeon: AOsborne Oman MD;  Location: [facility] LD ORS;  Service: Gynecology;  Laterality: N/A;    Family History  Problem Relation Age of Onset   Hypertension Mother    Hypertension Father    Cardiomyopathy Father    Hypertension Maternal Grandmother    Hypertension Maternal Grandfather     Social History   Socioeconomic History   Marital status: Married    Spouse name: JThresa Dozier  Number of children: Not on file   Years of education: Not on file   Highest education level: Not on file  Occupational History   Not on file  Tobacco Use   Smoking status: Former    Pack years: 0.00   Smokeless tobacco: Never  Vaping Use   Vaping Use: Former  Substance and Sexual Activity   Alcohol use: Not Currently   Drug use: Never   Sexual activity: Yes    Birth control/protection: Surgical    Comment: tubal  Other Topics Concern   Not on file  Social History Narrative   Not on file   Social Determinants of Health   Financial Resource Strain: Low Risk    Difficulty of Paying Living Expenses: Not very hard  Food Insecurity: No Food  Insecurity   Worried About RCharity fundraiserin the Last Year: Never true   RTaylorin the Last Year: Never true  Transportation Needs: No Transportation Needs   Lack of Transportation (Medical): No   Lack of Transportation (Non-Medical): No  Physical Activity: Insufficiently Active   Days of Exercise per Week: 2 days   Minutes of Exercise per Session: 10 min  Stress: Stress Concern Present   Feeling of Stress : To some extent  Social Connections: Moderately Integrated   Frequency of Communication with Friends and Family: More than three times a week   Frequency of Social Gatherings with Friends and Family: Three times a week   Attends Religious Services: More than 4 times per year   Active Member of Clubs or Organizations: No   Attends CArchivistMeetings: Never   Marital Status: Married  IHuman resources officerViolence: Not At Risk   Fear of Current or Ex-Partner: No   Emotionally Abused: No   Physically Abused: No   Sexually Abused: No    Outpatient Medications Prior to Visit  Medication Sig Dispense Refill   albuterol (VENTOLIN HFA) 108 (90 Base) MCG/ACT inhaler Inhale 2 puffs into the lungs every  6 (six) hours as needed for wheezing or shortness of breath. 8 g 2   gabapentin (NEURONTIN) 300 MG capsule Take 1 capsule (300 mg total) by mouth 3 (three) times daily. 90 capsule 1   Levonorgestrel-Ethinyl Estradiol (SEASONIQUE) 0.15-0.03 &0.01 MG tablet Take 1 tablet by mouth daily. 91 tablet 4   traZODone (DESYREL) 150 MG tablet Take 1 tablet (150 mg total) by mouth at bedtime as needed for sleep. 30 tablet 1   sertraline (ZOLOFT) 100 MG tablet Take 1.5 tablets (150 mg total) by mouth daily. 135 tablet 1   No facility-administered medications prior to visit.    Allergies  Allergen Reactions   Latex Rash    Review of Systems  Constitutional: Negative.   Respiratory: Negative.    Cardiovascular: Negative.   Psychiatric/Behavioral:  Negative for self-injury,  sleep disturbance and suicidal ideas. The patient is nervous/anxious.       Objective:    Physical Exam Constitutional:      Appearance: Normal appearance.  Cardiovascular:     Rate and Rhythm: Normal rate and regular rhythm.     Pulses: Normal pulses.     Heart sounds: Normal heart sounds.  Pulmonary:     Effort: Pulmonary effort is normal.     Breath sounds: Normal breath sounds.  Neurological:     Mental Status: She is alert.  Psychiatric:        Behavior: Behavior normal.        Thought Content: Thought content normal.        Judgment: Judgment normal.     Comments: Anxious affect; smiles when discussing her infant    BP 120/76 (BP Location: Left Arm, Patient Position: Sitting, Cuff Size: Normal)   Pulse 89   Temp 97.7 F (36.5 C) (Temporal)   Ht 5' 5"  (1.651 m)   Wt 154 lb (69.9 kg)   LMP 01/01/2021 (Exact Date)   SpO2 97%   Breastfeeding No   BMI 25.63 kg/m  Wt Readings from Last 3 Encounters:  03/25/21 154 lb (69.9 kg)  03/19/21 153 lb 3.2 oz (69.5 kg)  02/18/21 152 lb (68.9 kg)    There are no preventive care reminders to display for this patient.  There are no preventive care reminders to display for this patient.   No results found for: TSH Lab Results  Component Value Date   WBC 7.9 02/11/2021   HGB 10.7 (L) 02/11/2021   HCT 33.2 (L) 02/11/2021   MCV 85 02/11/2021   PLT 313 02/11/2021   Lab Results  Component Value Date   NA 142 02/11/2021   K 4.0 02/11/2021   CO2 22 02/11/2021   GLUCOSE 82 02/11/2021   BUN 11 02/11/2021   CREATININE 0.78 02/11/2021   BILITOT 0.4 02/11/2021   ALKPHOS 67 02/11/2021   AST 13 02/11/2021   ALT 15 02/11/2021   PROT 7.0 02/11/2021   ALBUMIN 4.1 02/11/2021   CALCIUM 8.3 (L) 02/11/2021   ANIONGAP 7 11/16/2020   EGFR 100 02/11/2021   Lab Results  Component Value Date   CHOL 169 02/11/2021   Lab Results  Component Value Date   HDL 48 02/11/2021   Lab Results  Component Value Date   LDLCALC 93  02/11/2021   Lab Results  Component Value Date   TRIG 162 (H) 02/11/2021   No results found for: CHOLHDL No results found for: HGBA1C     Assessment & Plan:   Problem List Items Addressed This Visit  Other   Depression with anxiety - Primary    -GAD-7 = 9 today; last was 2 -INCREASE sertraline to 200 mg; this was her dose prior to pregnancy        Relevant Medications   sertraline (ZOLOFT) 100 MG tablet   Insomnia    -doing well -continue trazodone         Meds ordered this encounter  Medications   sertraline (ZOLOFT) 100 MG tablet    Sig: Take 2 tablets (200 mg total) by mouth daily.    Dispense:  180 tablet    Refill:  [place], NP

## 2021-03-26 ENCOUNTER — Telehealth: Payer: Self-pay | Admitting: *Deleted

## 2021-03-26 NOTE — Telephone Encounter (Signed)
Urine drug screen for this encounter is consistent for prescribed medication 

## 2021-03-27 ENCOUNTER — Telehealth: Payer: Self-pay

## 2021-03-27 NOTE — Telephone Encounter (Signed)
Gabriella Schmidt called back about her drug screening result. And she wanted to know if a pain medicine will be prescribed?   Patient has been informed that Dr. Aretta Nip is not in the office today. However a message will be sent.

## 2021-03-30 MED ORDER — ACETAMINOPHEN-CODEINE #3 300-30 MG PO TABS: 1.0000 | ORAL_TABLET | Freq: Two times a day (BID) | ORAL | 2 refills | Status: DC | PRN

## 2021-03-30 NOTE — Telephone Encounter (Signed)
Patient informed. 

## 2021-04-08 ENCOUNTER — Encounter: Payer: Self-pay | Admitting: Women's Health

## 2021-04-08 ENCOUNTER — Ambulatory Visit (INDEPENDENT_AMBULATORY_CARE_PROVIDER_SITE_OTHER): Admitting: Women's Health

## 2021-04-08 ENCOUNTER — Other Ambulatory Visit: Payer: Self-pay

## 2021-04-08 ENCOUNTER — Other Ambulatory Visit (HOSPITAL_COMMUNITY)
Admission: RE | Admit: 2021-04-08 | Discharge: 2021-04-08 | Disposition: A | Discharge status: Home / Self Care | Source: Ambulatory Visit | Attending: Obstetrics & Gynecology | Admitting: Obstetrics & Gynecology

## 2021-04-08 VITALS — BP 119/74 | HR 81 | Ht 65.0 in | Wt 156.0 lb

## 2021-04-08 DIAGNOSIS — N926 Irregular menstruation, unspecified: Secondary | ICD-10-CM

## 2021-04-08 DIAGNOSIS — Z3041 Encounter for surveillance of contraceptive pills: Secondary | ICD-10-CM | POA: Diagnosis not present

## 2021-04-08 NOTE — Progress Notes (Signed)
GYN VISIT Patient name: Gabriella Schmidt MRN [id number]  Date of birth: 1981/05/29 Chief Complaint:   Follow-up (Medication Ametia/ been on period x 1 month light)  History of Present Illness:   Gabriella Schmidt is a 40 y.o. G8P0101 Caucasian female being seen today for on COCs rx'd in April for period management.  S/P BTL Oct 2021. H/O heavy painful periods and ovarian cysts, did well on generic seasonique in past, so we restarted this in April. Did great May & June, has been having light bleeding since beginning of July. Denies abnormal discharge, itching/odor/irritation. Some mild nausea, dizziness. Took HPT 'for husband' and was neg. Is OK w/ bleeding right now, wants to stay on these pills.  Patient's last menstrual period was 01/14/2021. The current method of family planning is OCP (estrogen/progesterone) and tubal ligation.  Last pap 02/21/20. Results were: NILM w/ HRHPV negative  Depression screen [facility] 2/9 03/25/2021 03/19/2021 02/18/2021 01/14/2021 10/29/2020  Decreased Interest 0 1 0 0 0  Down, Depressed, Hopeless 1 0 0 0 0  PHQ - 2 Score 1 1 0 0 0  Altered sleeping 0 3 - - 3  Tired, decreased energy 0 2 - - 3  Change in appetite 0 3 - - 0  Feeling bad or failure about yourself  1 0 - - 0  Trouble concentrating 0 0 - - 1  Moving slowly or fidgety/restless 0 0 - - 0  Suicidal thoughts 0 0 - - 0  PHQ-9 Score 2 9 - - 7  Difficult doing work/chores - - - - Not difficult at all     GAD 7 : Generalized Anxiety Score 03/25/2021 02/18/2021 01/14/2021 09/01/2020  Nervous, Anxious, on Edge 2 1 2 2   Control/stop worrying 3 0 2 1  Worry too much - different things 1 1 3 1   Trouble relaxing 2 0 2 3  Restless 0 0 3 0  Easily annoyed or irritable 0 0 0 0  Afraid - awful might happen 1 0 2 3  Total GAD 7 Score 9 2 14 10   Anxiety Difficulty Somewhat difficult Somewhat difficult Very difficult Not difficult at all     Review of Systems:   Pertinent items are noted in HPI Denies fever/chills,  dizziness, headaches, visual disturbances, fatigue, shortness of breath, chest pain, abdominal pain, vomiting, abnormal vaginal discharge/itching/odor/irritation, problems with periods, bowel movements, urination, or intercourse unless otherwise stated above.  Pertinent History Reviewed:  Reviewed past medical,surgical, social, obstetrical and family history.  Reviewed problem list, medications and allergies. Physical Assessment:   Vitals:   04/08/21 0940  BP: 119/74  Pulse: 81  Weight: 156 lb (70.8 kg)  Height: 5\' 5"  (1.651 m)  Body mass index is 25.96 kg/m.       Physical Examination:   General appearance: alert, well appearing, and in no distress  Mental status: alert, oriented to person, place, and time  Skin: warm & dry   Cardiovascular: normal heart rate noted  Respiratory: normal respiratory effort, no distress  Abdomen: soft, non-tender   Pelvic: VULVA: normal appearing vulva with no masses, tenderness or lesions, VAGINA: normal appearing vagina with normal color and discharge, no lesions, CERVIX: normal appearing cervix without discharge or lesions  Extremities: no edema   Chaperone: Levy Pupa    No results found for this or any previous visit (from the past 24 hour(s)).  Assessment & Plan:  1) Contraception surveillance> on generic seasonique for period management, light bleeding all of July, send  CV swab. Is ok w/ bleeding right now and wants to stay on pills. If changes mind, to let me know.  Meds: No orders of the defined types were placed in this encounter.   No orders of the defined types were placed in this encounter.   Return in about 1 year (around 04/08/2022) for Physical.  [place], WHNP-BC 04/08/2021 10:10 AM

## 2021-04-09 LAB — CERVICOVAGINAL ANCILLARY ONLY
Bacterial Vaginitis (gardnerella): NEGATIVE
Candida Glabrata: NEGATIVE
Candida Vaginitis: NEGATIVE
Chlamydia: NEGATIVE
Comment: NEGATIVE
Comment: NEGATIVE
Comment: NEGATIVE
Comment: NEGATIVE
Comment: NEGATIVE
Comment: NORMAL
Neisseria Gonorrhea: NEGATIVE
Trichomonas: NEGATIVE

## 2021-04-17 ENCOUNTER — Encounter: Admitting: Nurse Practitioner

## 2021-04-17 ENCOUNTER — Other Ambulatory Visit: Payer: Self-pay

## 2021-04-17 ENCOUNTER — Encounter: Payer: Self-pay | Admitting: Nurse Practitioner

## 2021-04-17 DIAGNOSIS — U071 COVID-19: Secondary | ICD-10-CM | POA: Insufficient documentation

## 2021-04-17 NOTE — Progress Notes (Signed)
Acute Office Visit  Subjective:    Patient ID: Gabriella Schmidt, female    DOB: [birth year], 40 y.o.   MRN: [id number]  Chief Complaint  Patient presents with  . Covid Positive    Tested positive on an at home test today. Symptoms started yesterday. Runny nose, cough, sore throat, body aches.   . Error     HPI Patient is in today for sick visit. She tested positive for COVID on a home test yesterday.   Past Medical History:  Diagnosis Date  . Anxiety   . Asthma    Phreesia 09/01/2020  . Degenerative cervical disc   . Depression    Phreesia 09/01/2020  . Dizziness   . Edema   . Hypertension     Past Surgical History:  Procedure Laterality Date  . TUBAL LIGATION N/A 06/29/2020   Procedure: POST PARTUM TUBAL LIGATION;  Surgeon: Osborne Oman, MD;  Location: [facility] LD [facility];  Service: Gynecology;  Laterality: N/A;    Family History  Problem Relation Age of Onset  . Hypertension Mother   . Hypertension Father   . Cardiomyopathy Father   . Hypertension Maternal Grandmother   . Hypertension Maternal Grandfather     Social History   Socioeconomic History  . Marital status: Married    Spouse name: Yolunda Kloos  . Number of children: Not on file  . Years of education: Not on file  . Highest education level: Not on file  Occupational History  . Not on file  Tobacco Use  . Smoking status: Former  . Smokeless tobacco: Never  Vaping Use  . Vaping Use: Former  Substance and Sexual Activity  . Alcohol use: Not Currently  . Drug use: Never  . Sexual activity: Yes    Birth control/protection: Surgical    Comment: tubal  Other Topics Concern  . Not on file  Social History Narrative  . Not on file   Social Determinants of Health   Financial Resource Strain: Not on file  Food Insecurity: Not on file  Transportation Needs: Not on file  Physical Activity: Not on file  Stress: Not on file  Social Connections: Not on file  Intimate Partner Violence: Not on file     Outpatient Medications Prior to Visit  Medication Sig Dispense Refill  . acetaminophen-codeine (TYLENOL #3) 300-30 MG tablet Take 1 tablet by mouth 2 (two) times daily as needed for moderate pain. 60 tablet 2  . albuterol (VENTOLIN HFA) 108 (90 Base) MCG/ACT inhaler Inhale 2 puffs into the lungs every 6 (six) hours as needed for wheezing or shortness of breath. 8 g 2  . Levonorgestrel-Ethinyl Estradiol (SEASONIQUE) 0.15-0.03 &0.01 MG tablet Take 1 tablet by mouth daily. 91 tablet 4  . sertraline (ZOLOFT) 100 MG tablet Take 2 tablets (200 mg total) by mouth daily. 180 tablet 1  . gabapentin (NEURONTIN) 300 MG capsule Take 1 capsule (300 mg total) by mouth 3 (three) times daily. 90 capsule 1  . traZODone (DESYREL) 150 MG tablet Take 1 tablet (150 mg total) by mouth at bedtime as needed for sleep. 30 tablet 1   No facility-administered medications prior to visit.    Allergies  Allergen Reactions  . Latex Rash    Review of Systems  HENT:  Positive for rhinorrhea and sore throat.   Respiratory:  Positive for cough.   Musculoskeletal:  Positive for myalgias.      Objective:    Physical Exam  Temp 99.6 F (37.6 C) (  Temporal)   Ht 5' 5"  (1.651 m)   Wt 156 lb (70.8 kg)   LMP 04/08/2021 (Exact Date)   Breastfeeding No   BMI 25.96 kg/m  Wt Readings from Last 3 Encounters:  04/30/21 152 lb (68.9 kg)  04/29/21 152 lb (68.9 kg)  04/17/21 156 lb (70.8 kg)    Health Maintenance Due  Topic Date Due  . COVID-19 Vaccine (3 - Booster for Moderna series) 09/18/2020  . INFLUENZA VACCINE  Never done     There are no preventive care reminders to display for this patient.   No results found for: TSH Lab Results  Component Value Date   WBC 7.9 02/11/2021   HGB 10.7 (L) 02/11/2021   HCT 33.2 (L) 02/11/2021   MCV 85 02/11/2021   PLT 313 02/11/2021   Lab Results  Component Value Date   NA 142 02/11/2021   K 4.0 02/11/2021   CO2 22 02/11/2021   GLUCOSE 82 02/11/2021   BUN 11  02/11/2021   CREATININE 0.78 02/11/2021   BILITOT 0.4 02/11/2021   ALKPHOS 67 02/11/2021   AST 13 02/11/2021   ALT 15 02/11/2021   PROT 7.0 02/11/2021   ALBUMIN 4.1 02/11/2021   CALCIUM 8.3 (L) 02/11/2021   ANIONGAP 7 11/16/2020   EGFR 100 02/11/2021   Lab Results  Component Value Date   CHOL 169 02/11/2021   Lab Results  Component Value Date   HDL 48 02/11/2021   Lab Results  Component Value Date   LDLCALC 93 02/11/2021   Lab Results  Component Value Date   TRIG 162 (H) 02/11/2021   No results found for: CHOLHDL No results found for: HGBA1C     Assessment & Plan:   Problem List Items Addressed This Visit      Other   COVID-19    -positive home test yesterday -will treat symptomatically  -Rx. Coricidin (has hx of HTN)     Other Visit Diagnoses    Erroneous encounter - disregard    -  Primary       No orders of the defined types were placed in this encounter.    Noreene Larsson, NP  This encounter was created in error - please disregard.

## 2021-04-17 NOTE — Assessment & Plan Note (Signed)
-  positive home test yesterday -will treat symptomatically  -Rx. Coricidin (has hx of HTN)

## 2021-04-20 ENCOUNTER — Other Ambulatory Visit: Payer: Self-pay

## 2021-04-20 DIAGNOSIS — G4700 Insomnia, unspecified: Secondary | ICD-10-CM

## 2021-04-20 MED ORDER — TRAZODONE HCL 150 MG PO TABS: 150.0000 mg | ORAL_TABLET | Freq: Every evening | ORAL | 1 refills | Status: DC | PRN

## 2021-04-23 ENCOUNTER — Other Ambulatory Visit: Payer: Self-pay

## 2021-04-23 ENCOUNTER — Ambulatory Visit
Admission: RE | Admit: 2021-04-23 | Discharge: 2021-04-23 | Disposition: A | Discharge status: Home / Self Care | Source: Ambulatory Visit | Attending: Physical Medicine & Rehabilitation | Admitting: Physical Medicine & Rehabilitation

## 2021-04-23 DIAGNOSIS — G894 Chronic pain syndrome: Secondary | ICD-10-CM

## 2021-04-29 ENCOUNTER — Encounter: Payer: Self-pay | Admitting: Nurse Practitioner

## 2021-04-29 ENCOUNTER — Ambulatory Visit (INDEPENDENT_AMBULATORY_CARE_PROVIDER_SITE_OTHER): Admitting: Nurse Practitioner

## 2021-04-29 ENCOUNTER — Other Ambulatory Visit: Payer: Self-pay

## 2021-04-29 VITALS — BP 122/80 | HR 84 | Temp 98.7°F | Ht 65.0 in | Wt 152.0 lb

## 2021-04-29 DIAGNOSIS — D649 Anemia, unspecified: Secondary | ICD-10-CM

## 2021-04-29 DIAGNOSIS — F418 Other specified anxiety disorders: Secondary | ICD-10-CM

## 2021-04-29 DIAGNOSIS — E785 Hyperlipidemia, unspecified: Secondary | ICD-10-CM | POA: Diagnosis not present

## 2021-04-29 DIAGNOSIS — I10 Essential (primary) hypertension: Secondary | ICD-10-CM | POA: Diagnosis not present

## 2021-04-29 NOTE — Assessment & Plan Note (Signed)
-  Continue sertraline 200 mg daily -GAD-7 = 0 -f/u in 3 months

## 2021-04-29 NOTE — Progress Notes (Signed)
Acute Office Visit  Subjective:    Patient ID: Gabriella Schmidt, female    DOB: 09-20-1981, 40 y.o.   MRN: [id number]  Chief Complaint  Patient presents with   Depression    Follow up    Depression       Patient is in today for med check. At her last OV, we increased her sertraline to 200 mg for GAD-7 = 9. She states she is doing great, and noticed a big difference after starting a higher dose.   Past Medical History:  Diagnosis Date   Anxiety    Asthma    Phreesia 09/01/2020   Degenerative cervical disc    Depression    Phreesia 09/01/2020   Dizziness    Edema    Hypertension     Past Surgical History:  Procedure Laterality Date   TUBAL LIGATION N/A 06/29/2020   Procedure: POST PARTUM TUBAL LIGATION;  Surgeon: Osborne Oman, MD;  Location: [facility] LD [facility]S;  Service: Gynecology;  Laterality: N/A;    Family History  Problem Relation Age of Onset   Hypertension Mother    Hypertension Father    Cardiomyopathy Father    Hypertension Maternal Grandmother    Hypertension Maternal Grandfather     Social History   Socioeconomic History   Marital status: Married    Spouse name: Demeisha Geraghty   Number of children: Not on file   Years of education: Not on file   Highest education level: Not on file  Occupational History   Not on file  Tobacco Use   Smoking status: Former   Smokeless tobacco: Never  Vaping Use   Vaping Use: Former  Substance and Sexual Activity   Alcohol use: Not Currently   Drug use: Never   Sexual activity: Yes    Birth control/protection: Surgical    Comment: tubal  Other Topics Concern   Not on file  Social History Narrative   Not on file   Social Determinants of Health   Financial Resource Strain: Not on file  Food Insecurity: Not on file  Transportation Needs: Not on file  Physical Activity: Not on file  Stress: Not on file  Social Connections: Not on file  Intimate Partner Violence: Not on file    Outpatient Medications  Prior to Visit  Medication Sig Dispense Refill   acetaminophen-codeine (TYLENOL #3) 300-30 MG tablet Take 1 tablet by mouth 2 (two) times daily as needed for moderate pain. 60 tablet 2   albuterol (VENTOLIN HFA) 108 (90 Base) MCG/ACT inhaler Inhale 2 puffs into the lungs every 6 (six) hours as needed for wheezing or shortness of breath. 8 g 2   gabapentin (NEURONTIN) 300 MG capsule Take 1 capsule (300 mg total) by mouth 3 (three) times daily. 90 capsule 1   Levonorgestrel-Ethinyl Estradiol (SEASONIQUE) 0.15-0.03 &0.01 MG tablet Take 1 tablet by mouth daily. 91 tablet 4   sertraline (ZOLOFT) 100 MG tablet Take 2 tablets (200 mg total) by mouth daily. 180 tablet 1   traZODone (DESYREL) 150 MG tablet Take 1 tablet (150 mg total) by mouth at bedtime as needed for sleep. 30 tablet 1   No facility-administered medications prior to visit.    Allergies  Allergen Reactions   Latex Rash    Review of Systems  Constitutional: Negative.   Respiratory: Negative.    Cardiovascular: Negative.   Psychiatric/Behavioral:  Positive for depression.        Anxiety much improved from last visit.  Objective:    Physical Exam Constitutional:      Appearance: Normal appearance.  Cardiovascular:     Rate and Rhythm: Normal rate and regular rhythm.     Pulses: Normal pulses.     Heart sounds: Normal heart sounds.  Pulmonary:     Effort: Pulmonary effort is normal.     Breath sounds: Normal breath sounds.  Neurological:     Mental Status: She is alert.  Psychiatric:        Mood and Affect: Mood normal.        Behavior: Behavior normal.        Thought Content: Thought content normal.        Judgment: Judgment normal.     Comments: GAD-7 = 0    BP 122/80 (BP Location: Left Arm, Patient Position: Sitting, Cuff Size: Large)   Pulse 84   Temp 98.7 F (37.1 C) (Temporal)   Ht _0  (1.651 m)   Wt 152 lb (68.9 kg)   LMP 04/08/2021 (Exact Date)   SpO2 95%   Breastfeeding No   BMI 25.29 kg/m   Wt Readings from Last 3 Encounters:  04/29/21 152 lb (68.9 kg)  04/17/21 156 lb (70.8 kg)  04/08/21 156 lb (70.8 kg)    Health Maintenance Due  Topic Date Due   INFLUENZA VACCINE  04/20/2021    There are no preventive care reminders to display for this patient.   No results found for: TSH Lab Results  Component Value Date   WBC 7.9 02/11/2021   HGB 10.7 (L) 02/11/2021   HCT 33.2 (L) 02/11/2021   MCV 85 02/11/2021   PLT 313 02/11/2021   Lab Results  Component Value Date   NA 142 02/11/2021   K 4.0 02/11/2021   CO2 22 02/11/2021   GLUCOSE 82 02/11/2021   BUN 11 02/11/2021   CREATININE 0.78 02/11/2021   BILITOT 0.4 02/11/2021   ALKPHOS 67 02/11/2021   AST 13 02/11/2021   ALT 15 02/11/2021   PROT 7.0 02/11/2021   ALBUMIN 4.1 02/11/2021   CALCIUM 8.3 (L) 02/11/2021   ANIONGAP 7 11/16/2020   EGFR 100 02/11/2021   Lab Results  Component Value Date   CHOL 169 02/11/2021   Lab Results  Component Value Date   HDL 48 02/11/2021   Lab Results  Component Value Date   LDLCALC 93 02/11/2021   Lab Results  Component Value Date   TRIG 162 (H) 02/11/2021   No results found for: CHOLHDL No results found for: HGBA1C     Assessment & Plan:   Problem List Items Addressed This Visit       Cardiovascular and Mediastinum   Chronic hypertension   Relevant Orders   CBC with Differential/Platelet   CMP14+EGFR   Lipid Panel With LDL/HDL Ratio     Other   Depression with anxiety    -Continue sertraline 200 mg daily -GAD-7 = 0 -f/u in 3 months       Hyperlipidemia   Relevant Orders   Lipid Panel With LDL/HDL Ratio   Other Visit Diagnoses     Anemia, unspecified type    -  Primary   Relevant Orders   Iron, TIBC and Ferritin Panel        No orders of the defined types were placed in this encounter.    Noreene Larsson, NP

## 2021-04-29 NOTE — Patient Instructions (Signed)
Please have fasting labs drawn 2-3 days prior to your appointment so we can discuss the results during your office visit.  

## 2021-04-30 ENCOUNTER — Telehealth: Payer: Self-pay | Admitting: *Deleted

## 2021-04-30 ENCOUNTER — Encounter: Payer: Self-pay | Admitting: Physical Medicine & Rehabilitation

## 2021-04-30 ENCOUNTER — Encounter: Attending: Physical Medicine & Rehabilitation | Admitting: Physical Medicine & Rehabilitation

## 2021-04-30 ENCOUNTER — Other Ambulatory Visit: Payer: Self-pay

## 2021-04-30 VITALS — BP 134/86 | HR 73 | Temp 98.6°F | Ht 65.0 in | Wt 152.0 lb

## 2021-04-30 DIAGNOSIS — M533 Sacrococcygeal disorders, not elsewhere classified: Secondary | ICD-10-CM | POA: Insufficient documentation

## 2021-04-30 DIAGNOSIS — G8929 Other chronic pain: Secondary | ICD-10-CM | POA: Insufficient documentation

## 2021-04-30 MED ORDER — DIAZEPAM 10 MG PO TABS: ORAL_TABLET | ORAL | 0 refills | Status: DC

## 2021-04-30 NOTE — Telephone Encounter (Signed)
Gabriella Schmidt called for a pre med for her procedure today. Called to her [place] and she was notified. Must have a driver.

## 2021-04-30 NOTE — Patient Instructions (Signed)
Sacroiliac injection was performed today. A combination of numbing medicine (lidocaine) plus a cortisone medicine (betamethasone) was injected. The injection was done under x-ray guidance. This procedure has been performed to help reduce low back and buttocks pain as well as potentially hip pain. The duration of this injection is variable lasting from hours to  Months. It may repeated if needed. 

## 2021-04-30 NOTE — Progress Notes (Signed)

## 2021-04-30 NOTE — Progress Notes (Signed)
  PROCEDURE RECORD [facility]   Name: Gabriella Schmidt DOB:[birth year] MRN: [id number]  Date:04/30/2021  Physician: Alysia Penna, MD    Nurse/CMA: Jorja Loa MA  Allergies:  Allergies  Allergen Reactions   Latex Rash    Consent Signed: Yes.    Is patient diabetic? No.  CBG today? N/a  Pregnant: No. LMP: Patient's last menstrual period was 04/08/2021 (exact date). (age 40-55)  Anticoagulants: no Anti-inflammatory: no Antibiotics: no  Procedure: Bilateral Sacroiliac Steroid injection  Position: Prone Start Time: 11:04 AM  End Time: 11:10 AM  Fluoro Time: 25 RN/CMA  MA  MA    Time 10:48 AM 11:15 AM    BP 134/86 131/84    Pulse 78 70    Respirations 14 14    O2 Sat 99 99    S/S 6 6    Pain Level 5/10 0/10     D/C home with Spouse, patient A & O X 3, D/C instructions reviewed, and sits independently.         Subjective:    Patient ID: Gabriella Schmidt, female    DOB: 04-02-1981, 40 y.o.   MRN: [id number]  HPI    Review of Systems     Objective:   Physical Exam        Assessment & Plan:

## 2021-05-21 ENCOUNTER — Other Ambulatory Visit: Payer: Self-pay | Admitting: Physical Medicine & Rehabilitation

## 2021-06-26 ENCOUNTER — Other Ambulatory Visit: Payer: Self-pay

## 2021-06-26 DIAGNOSIS — G4700 Insomnia, unspecified: Secondary | ICD-10-CM

## 2021-06-26 MED ORDER — TRAZODONE HCL 150 MG PO TABS: 150.0000 mg | ORAL_TABLET | Freq: Every evening | ORAL | 1 refills | Status: DC | PRN

## 2021-07-09 ENCOUNTER — Encounter: Admitting: Physical Medicine & Rehabilitation

## 2021-07-16 ENCOUNTER — Encounter: Admitting: Physical Medicine & Rehabilitation

## 2021-07-26 ENCOUNTER — Other Ambulatory Visit: Payer: Self-pay | Admitting: Physical Medicine & Rehabilitation

## 2021-07-27 NOTE — Telephone Encounter (Signed)
Refill for Tylenol #3

## 2021-08-03 ENCOUNTER — Telehealth: Payer: Self-pay

## 2021-08-03 NOTE — Telephone Encounter (Signed)
Patient called need med refill  traZODone (DESYREL) 150 MG tablet   [facility] [address] Linna Hoff

## 2021-08-04 ENCOUNTER — Other Ambulatory Visit: Payer: Self-pay | Admitting: Internal Medicine

## 2021-08-04 DIAGNOSIS — G4700 Insomnia, unspecified: Secondary | ICD-10-CM

## 2021-08-04 MED ORDER — TRAZODONE HCL 150 MG PO TABS
150.0000 mg | ORAL_TABLET | Freq: Every evening | ORAL | 0 refills | Status: DC | PRN
Start: 2021-08-04 — End: 2021-11-11

## 2021-08-06 ENCOUNTER — Ambulatory Visit: Admitting: Nurse Practitioner

## 2021-08-27 ENCOUNTER — Encounter: Attending: Physical Medicine & Rehabilitation | Admitting: Physical Medicine & Rehabilitation

## 2021-08-27 ENCOUNTER — Other Ambulatory Visit: Payer: Self-pay

## 2021-08-27 ENCOUNTER — Encounter: Payer: Self-pay | Admitting: Physical Medicine & Rehabilitation

## 2021-08-27 VITALS — BP 121/76 | HR 78 | Temp 98.5°F | Ht 65.0 in | Wt 158.0 lb

## 2021-08-27 DIAGNOSIS — G8929 Other chronic pain: Secondary | ICD-10-CM | POA: Diagnosis not present

## 2021-08-27 DIAGNOSIS — M533 Sacrococcygeal disorders, not elsewhere classified: Secondary | ICD-10-CM | POA: Diagnosis not present

## 2021-08-27 MED ORDER — DIAZEPAM 5 MG PO TABS: ORAL_TABLET | ORAL | 1 refills | Status: DC

## 2021-08-27 NOTE — Progress Notes (Signed)
Subjective:    Patient ID: Gabriella Schmidt, female    DOB: 04-04-1981, 40 y.o.   MRN: [id number]  HPI 40 year old female with chronic low back pain as well as pelvic pain.  The patient has pain in the lateral hips the tailbone the lumbar area as well as the gluteal area.  She underwent bilateral sacroiliac injections under fluoroscopic guidance in August 2022.  While she found the procedure itself uncomfortable despite 10 mg of Valium she did get a near 100% relief of her gluteal pain.  She even felt like her lumbar pain was little better.  She still have the coccygeal pain however 3 month relief with the sacroiliac injections. Pain Inventory Average Pain 6 Pain Right Now 7 My pain is constant, sharp, burning, stabbing, and aching  In the last 24 hours, has pain interfered with the following? General activity 4 Relation with others 6 Enjoyment of life 6 What TIME of day is your pain at its worst? morning , daytime, evening, and night Sleep (in general) Fair  Pain is worse with: walking, bending, sitting, standing, and some activites Pain improves with: medication and injections Relief from Meds: 6  Family History  Problem Relation Age of Onset   Hypertension Mother    Hypertension Father    Cardiomyopathy Father    Hypertension Maternal Grandmother    Hypertension Maternal Grandfather    Social History   Socioeconomic History   Marital status: Married    Spouse name: Yolinda Duerr   Number of children: Not on file   Years of education: Not on file   Highest education level: Not on file  Occupational History   Not on file  Tobacco Use   Smoking status: Former   Smokeless tobacco: Never  Vaping Use   Vaping Use: Former  Substance and Sexual Activity   Alcohol use: Not Currently   Drug use: Never   Sexual activity: Yes    Birth control/protection: Surgical    Comment: tubal  Other Topics Concern   Not on file  Social History Narrative   Not on file   Social  Determinants of Health   Financial Resource Strain: Not on file  Food Insecurity: Not on file  Transportation Needs: Not on file  Physical Activity: Not on file  Stress: Not on file  Social Connections: Not on file   Past Surgical History:  Procedure Laterality Date   TUBAL LIGATION N/A 06/29/2020   Procedure: [place];  Surgeon: Osborne Oman, MD;  Location: [facility]S;  Service: Gynecology;  Laterality: N/A;   Past Surgical History:  Procedure Laterality Date   TUBAL LIGATION N/A 06/29/2020   Procedure: POST PARTUM TUBAL LIGATION;  Surgeon: Osborne Oman, MD;  Location: [facility];  Service: Gynecology;  Laterality: N/A;   Past Medical History:  Diagnosis Date   Anxiety    Asthma    Phreesia 09/01/2020   Degenerative cervical disc    Depression    Phreesia 09/01/2020   Dizziness    Edema    Hypertension    BP 121/76   Pulse 78   Temp 98.5 F (36.9 C) (Oral)   Ht 5\' 5"  (1.651 m)   Wt 158 lb (71.7 kg)   SpO2 97%   BMI 26.29 kg/m   Opioid Risk Score:   Fall Risk Score:  `1  Depression screen [facility] 2/9  Depression screen [facility] 2/9 08/27/2021 04/30/2021 04/29/2021 04/17/2021 03/25/2021 03/19/2021 02/18/2021  Decreased Interest 0  0 1 0 0 1 0  Down, Depressed, Hopeless 0 0 0 0 1 0 0  PHQ - 2 Score 0 0 1 0 1 1 0  Altered sleeping - - 0 0 0 3 -  Tired, decreased energy - - 0 1 0 2 -  Change in appetite - - 1 0 0 3 -  Feeling bad or failure about yourself  - - 0 0 1 0 -  Trouble concentrating - - 0 0 0 0 -  Moving slowly or fidgety/restless - - 0 0 0 0 -  Suicidal thoughts - - 0 0 0 0 -  PHQ-9 Score - - 2 1 2 9  -  Difficult doing work/chores - - - - - - -  Some recent data might be hidden      Review of Systems  Musculoskeletal:  Positive for back pain.       Bilateral leg pain  All other systems reviewed and are negative.     Objective:   Physical Exam Vitals and nursing note reviewed.  Constitutional:      Appearance: Normal appearance.   HENT:     Head: Normocephalic and atraumatic.  Eyes:     Extraocular Movements: Extraocular movements intact.     Conjunctiva/sclera: Conjunctivae normal.     Pupils: Pupils are equal, round, and reactive to light.  Musculoskeletal:        General: No tenderness.  Skin:    General: Skin is warm and dry.  Neurological:     Mental Status: She is alert and oriented to person, place, and time.     Comments: Motor strength is 5/5 bilateral hip flexor knee extensor ankle dorsiflexor  Psychiatric:        Mood and Affect: Mood normal.        Behavior: Behavior normal.          Assessment & Plan:   1.  Chronic low back, pelvic and hip pain.  There may be some sciatic symptoms down the right leg with some hypersensitivity in the S1 dermatomal distribution. Worse pain since pregnancy has been in the coccygeal area.  Recommend avoiding weightbearing over the coccygeal area.  We will check x-rays of the lumbosacral spine We discussed that she has likely several pain generators. History of lumbar degenerative disc suspect lumbosacral junction given her physical examination. In addition the patient has evidence of trochanteric bursitis Given recent pregnancy suspect sacroiliac dysfunction as a pain generator and we will set her up with bilateral sacroiliac injections.  Repeat sacroiliac injections next month bilaterally given the 74-month results.

## 2021-09-02 ENCOUNTER — Encounter: Payer: Self-pay | Admitting: Nurse Practitioner

## 2021-09-02 ENCOUNTER — Other Ambulatory Visit: Payer: Self-pay

## 2021-09-02 ENCOUNTER — Ambulatory Visit (INDEPENDENT_AMBULATORY_CARE_PROVIDER_SITE_OTHER): Admitting: Nurse Practitioner

## 2021-09-02 DIAGNOSIS — F418 Other specified anxiety disorders: Secondary | ICD-10-CM | POA: Diagnosis not present

## 2021-09-02 DIAGNOSIS — I10 Essential (primary) hypertension: Secondary | ICD-10-CM | POA: Diagnosis not present

## 2021-09-02 DIAGNOSIS — D649 Anemia, unspecified: Secondary | ICD-10-CM | POA: Insufficient documentation

## 2021-09-02 DIAGNOSIS — E785 Hyperlipidemia, unspecified: Secondary | ICD-10-CM

## 2021-09-02 MED ORDER — DIAZEPAM 5 MG PO TABS: ORAL_TABLET | ORAL | 1 refills | Status: DC

## 2021-09-02 NOTE — Assessment & Plan Note (Signed)
ordered lipid panel

## 2021-09-02 NOTE — Assessment & Plan Note (Signed)
Lab Results  Component Value Date   WBC 7.9 02/11/2021   HGB 10.7 (L) 02/11/2021   HCT 33.2 (L) 02/11/2021   MCV 85 02/11/2021   PLT 313 02/11/2021   -check CBC and iron panel today

## 2021-09-02 NOTE — Assessment & Plan Note (Signed)
BP Readings from Last 3 Encounters:  09/02/21 132/84  08/27/21 121/76  04/30/21 134/86   -well-controlled; no changes to BP meds

## 2021-09-02 NOTE — Patient Instructions (Signed)
Please have fasting labs drawn as soon as possible.  I will be moving to [facility] located at [address], [place], [place] [address] effective Sep 20, 2021. If you would like to establish care with [facility] please call [phone].

## 2021-09-02 NOTE — Assessment & Plan Note (Signed)
-  depression and anxiety doing well -continue sertraline

## 2021-09-02 NOTE — Progress Notes (Signed)
Acute Office Visit  Subjective:    Patient ID: Gabriella Schmidt, female    DOB: Aug 17, 1981, 40 y.o.   MRN: [id number]  Chief Complaint  Patient presents with   Follow-up    3 month follow up    HPI Patient is in today for lab follow-up for HTN, depression with anxiety, and HLD.  No acute concerns.  Past Medical History:  Diagnosis Date   Anxiety    Asthma    Phreesia 09/01/2020   Degenerative cervical disc    Depression    Phreesia 09/01/2020   Dizziness    Edema    Hypertension     Past Surgical History:  Procedure Laterality Date   TUBAL LIGATION N/A 06/29/2020   Procedure: POST PARTUM TUBAL LIGATION;  Surgeon: Osborne Oman, MD;  Location: [facility];  Service: Gynecology;  Laterality: N/A;    Family History  Problem Relation Age of Onset   Hypertension Mother    Hypertension Father    Cardiomyopathy Father    Hypertension Maternal Grandmother    Hypertension Maternal Grandfather     Social History   Socioeconomic History   Marital status: Married    Spouse name: Armina Galloway   Number of children: Not on file   Years of education: Not on file   Highest education level: Not on file  Occupational History   Not on file  Tobacco Use   Smoking status: Former   Smokeless tobacco: Never  Vaping Use   Vaping Use: Former  Substance and Sexual Activity   Alcohol use: Not Currently   Drug use: Never   Sexual activity: Yes    Birth control/protection: Surgical    Comment: tubal  Other Topics Concern   Not on file  Social History Narrative   Not on file   Social Determinants of Health   Financial Resource Strain: Not on file  Food Insecurity: Not on file  Transportation Needs: Not on file  Physical Activity: Not on file  Stress: Not on file  Social Connections: Not on file  Intimate Partner Violence: Not on file    Outpatient Medications Prior to Visit  Medication Sig Dispense Refill   acetaminophen-codeine (TYLENOL #3) 300-30 MG tablet  TAKE 1 TABLET BY MOUTH TWICE DAILY AS NEEDED FOR MODERATE PAIN 60 tablet 2   albuterol (VENTOLIN HFA) 108 (90 Base) MCG/ACT inhaler Inhale 2 puffs into the lungs every 6 (six) hours as needed for wheezing or shortness of breath. 8 g 2   gabapentin (NEURONTIN) 300 MG capsule TAKE 1 CAPSULE(300 MG) BY MOUTH THREE TIMES DAILY 90 capsule 1   ibuprofen (ADVIL) 800 MG tablet Take 800 mg by mouth every 8 (eight) hours as needed. For tooth extraction     Levonorgestrel-Ethinyl Estradiol (SEASONIQUE) 0.15-0.03 &0.01 MG tablet Take 1 tablet by mouth daily. 91 tablet 4   sertraline (ZOLOFT) 100 MG tablet Take 2 tablets (200 mg total) by mouth daily. 180 tablet 1   traZODone (DESYREL) 150 MG tablet Take 1 tablet (150 mg total) by mouth at bedtime as needed for sleep. 90 tablet 0   diazepam (VALIUM) 5 MG tablet Take one tablet po 30 minutes prior to procedure. Must have a driver. 3 tablet 1   No facility-administered medications prior to visit.    Allergies  Allergen Reactions   Latex Rash    Review of Systems  Constitutional: Negative.   Respiratory: Negative.    Cardiovascular: Negative.   Musculoskeletal: Negative.   Psychiatric/Behavioral:  Negative.        Objective:    Physical Exam Constitutional:      Appearance: Normal appearance.  Cardiovascular:     Rate and Rhythm: Normal rate and regular rhythm.     Pulses: Normal pulses.     Heart sounds: Normal heart sounds.  Pulmonary:     Effort: Pulmonary effort is normal.     Breath sounds: Normal breath sounds.  Musculoskeletal:        General: Normal range of motion.  Neurological:     Mental Status: She is alert.  Psychiatric:        Mood and Affect: Mood normal.        Behavior: Behavior normal.        Thought Content: Thought content normal.        Judgment: Judgment normal.    BP 132/84    Pulse 98    Ht 5' 5"  (1.651 m)    Wt 159 lb 1.9 oz (72.2 kg)    LMP 07/12/2021 (Approximate)    SpO2 98%    BMI 26.48 kg/m  Wt Readings  from Last 3 Encounters:  09/02/21 159 lb 1.9 oz (72.2 kg)  08/27/21 158 lb (71.7 kg)  04/30/21 152 lb (68.9 kg)    Health Maintenance Due  Topic Date Due   Pneumococcal Vaccine 80-41 Years old (1 - PCV) Never done   COVID-19 Vaccine (3 - Booster for Moderna series) 06/13/2020    There are no preventive care reminders to display for this patient.   No results found for: TSH Lab Results  Component Value Date   WBC 7.9 02/11/2021   HGB 10.7 (L) 02/11/2021   HCT 33.2 (L) 02/11/2021   MCV 85 02/11/2021   PLT 313 02/11/2021   Lab Results  Component Value Date   NA 142 02/11/2021   K 4.0 02/11/2021   CO2 22 02/11/2021   GLUCOSE 82 02/11/2021   BUN 11 02/11/2021   CREATININE 0.78 02/11/2021   BILITOT 0.4 02/11/2021   ALKPHOS 67 02/11/2021   AST 13 02/11/2021   ALT 15 02/11/2021   PROT 7.0 02/11/2021   ALBUMIN 4.1 02/11/2021   CALCIUM 8.3 (L) 02/11/2021   ANIONGAP 7 11/16/2020   EGFR 100 02/11/2021   Lab Results  Component Value Date   CHOL 169 02/11/2021   Lab Results  Component Value Date   HDL 48 02/11/2021   Lab Results  Component Value Date   LDLCALC 93 02/11/2021   Lab Results  Component Value Date   TRIG 162 (H) 02/11/2021   No results found for: CHOLHDL No results found for: HGBA1C     Assessment & Plan:   Problem List Items Addressed This Visit       Cardiovascular and Mediastinum   Chronic hypertension    BP Readings from Last 3 Encounters:  09/02/21 132/84  08/27/21 121/76  04/30/21 134/86  -well-controlled; no changes to BP meds      Relevant Orders   CBC with Differential/Platelet   CMP14+EGFR   Lipid Panel With LDL/HDL Ratio     Other   Depression with anxiety    -depression and anxiety doing well -continue sertraline      Relevant Medications   diazepam (VALIUM) 5 MG tablet   Other Relevant Orders   CMP14+EGFR   Hyperlipidemia    -ordered lipid panel      Relevant Orders   Lipid Panel With LDL/HDL Ratio   Anemia  Lab Results  Component Value Date   WBC 7.9 02/11/2021   HGB 10.7 (L) 02/11/2021   HCT 33.2 (L) 02/11/2021   MCV 85 02/11/2021   PLT 313 02/11/2021  -check CBC and iron panel today      Relevant Orders   CBC with Differential/Platelet   Iron, TIBC and Ferritin Panel     Meds ordered this encounter  Medications   diazepam (VALIUM) 5 MG tablet    Sig: Take one tablet po 30 minutes prior to procedure. Must have a driver.    Dispense:  3 tablet    Refill:  [place], NP

## 2021-09-24 ENCOUNTER — Other Ambulatory Visit: Payer: Self-pay

## 2021-09-24 DIAGNOSIS — F418 Other specified anxiety disorders: Secondary | ICD-10-CM

## 2021-09-24 MED ORDER — SERTRALINE HCL 100 MG PO TABS
200.0000 mg | ORAL_TABLET | Freq: Every day | ORAL | 1 refills | Status: DC
Start: 2021-09-24 — End: 2022-03-26

## 2021-10-01 ENCOUNTER — Telehealth: Payer: Self-pay

## 2021-10-01 MED ORDER — DIAZEPAM 10 MG PO TABS: 10.0000 mg | ORAL_TABLET | Freq: Once | ORAL | 0 refills | Status: AC

## 2021-10-01 NOTE — Telephone Encounter (Signed)
Patient called requesting diazepam for procedure. While reviewing chart Diazepam 5 mg #3 with 1 refill was but does not look transmitted. I questioned patient about it and she states she didn't know about prescription. I called in Diazepam 10 mg #1 no refills for procedure.

## 2021-10-02 LAB — CMP14+EGFR
ALT: 29 IU/L (ref 0–32)
AST: 24 IU/L (ref 0–40)
Albumin/Globulin Ratio: 1.3 (ref 1.2–2.2)
Albumin: 4.2 g/dL (ref 3.8–4.8)
Alkaline Phosphatase: 69 IU/L (ref 44–121)
BUN/Creatinine Ratio: 12 (ref 9–23)
BUN: 10 mg/dL (ref 6–20)
Bilirubin Total: 0.5 mg/dL (ref 0.0–1.2)
CO2: 22 mmol/L (ref 20–29)
Calcium: 9.2 mg/dL (ref 8.7–10.2)
Chloride: 101 mmol/L (ref 96–106)
Creatinine, Ser: 0.81 mg/dL (ref 0.57–1.00)
Globulin, Total: 3.3 g/dL (ref 1.5–4.5)
Glucose: 89 mg/dL (ref 70–99)
Potassium: 4.9 mmol/L (ref 3.5–5.2)
Sodium: 140 mmol/L (ref 134–144)
Total Protein: 7.5 g/dL (ref 6.0–8.5)
eGFR: 95 mL/min/{1.73_m2} (ref >59)

## 2021-10-02 LAB — CBC WITH DIFFERENTIAL/PLATELET
Basophils Absolute: 0 10*3/uL (ref 0.0–0.2)
Basos: 0 %
EOS (ABSOLUTE): 0.1 10*3/uL (ref 0.0–0.4)
Eos: 1 %
Hematocrit: 40.3 % (ref 34.0–46.6)
Hemoglobin: 13.3 g/dL (ref 11.1–15.9)
Immature Grans (Abs): 0 10*3/uL (ref 0.0–0.1)
Immature Granulocytes: 0 %
Lymphocytes Absolute: 2.3 10*3/uL (ref 0.7–3.1)
Lymphs: 33 %
MCH: 27.6 pg (ref 26.6–33.0)
MCHC: 33 g/dL (ref 31.5–35.7)
MCV: 84 fL (ref 79–97)
Monocytes Absolute: 0.5 10*3/uL (ref 0.1–0.9)
Monocytes: 7 %
Neutrophils Absolute: 4.2 10*3/uL (ref 1.4–7.0)
Neutrophils: 59 %
Platelets: 373 10*3/uL (ref 150–450)
RBC: 4.82 x10E6/uL (ref 3.77–5.28)
RDW: 12.8 % (ref 11.7–15.4)
WBC: 7.2 10*3/uL (ref 3.4–10.8)

## 2021-10-02 LAB — IRON,TIBC AND FERRITIN PANEL
Ferritin: 39 ng/mL (ref 15–150)
Iron Saturation: 11 % — ABNORMAL LOW (ref 15–55)
Iron: 62 ug/dL (ref 27–159)
Total Iron Binding Capacity: 587 ug/dL (ref 250–450)
UIBC: 525 ug/dL — ABNORMAL HIGH (ref 131–425)

## 2021-10-02 LAB — LIPID PANEL WITH LDL/HDL RATIO
Cholesterol, Total: 174 mg/dL (ref 100–199)
HDL: 59 mg/dL (ref >39)
LDL Chol Calc (NIH): 95 mg/dL (ref 0–99)
LDL/HDL Ratio: 1.6 ratio (ref 0.0–3.2)
Triglycerides: 110 mg/dL (ref 0–149)
VLDL Cholesterol Cal: 20 mg/dL (ref 5–40)

## 2021-10-05 ENCOUNTER — Encounter: Attending: Physical Medicine & Rehabilitation | Admitting: Physical Medicine & Rehabilitation

## 2021-10-05 ENCOUNTER — Encounter: Payer: Self-pay | Admitting: Physical Medicine & Rehabilitation

## 2021-10-05 ENCOUNTER — Other Ambulatory Visit: Payer: Self-pay

## 2021-10-05 VITALS — BP 145/89 | HR 87 | Temp 98.4°F | Ht 65.0 in | Wt 159.0 lb

## 2021-10-05 DIAGNOSIS — M533 Sacrococcygeal disorders, not elsewhere classified: Secondary | ICD-10-CM | POA: Diagnosis present

## 2021-10-05 DIAGNOSIS — G8929 Other chronic pain: Secondary | ICD-10-CM | POA: Diagnosis present

## 2021-10-05 MED ORDER — ACETAMINOPHEN-CODEINE #3 300-30 MG PO TABS: ORAL_TABLET | ORAL | 2 refills | Status: DC

## 2021-10-05 NOTE — Progress Notes (Addendum)
°  PROCEDURE RECORD [facility]   Name: Gabriella Schmidt DOB:08-Aug-1981 MRN: [id number]  Date:10/05/2021  Physician: Alysia Penna, MD    Nurse/CMA: Jorja Loa MA  Allergies:  Allergies  Allergen Reactions   Latex Rash    Consent Signed: Yes.    Is patient diabetic? No.  CBG today? N/A  Pregnant: No. LMP: No LMP recorded. (age 38-55)  Anticoagulants: no Anti-inflammatory: no Antibiotics: no  Procedure: Bilateral Sacroiliac Steroid injection  Position: Prone Start Time: 2:02 PM  End Time: 2:10 PM  Fluoro Time: 33  RN/CMA  MA  MA    Time 1:42 pm 2:15 pm    BP 145/89/ 114/76    Pulse 85 77    Respirations 16 16    O2 Sat 98 98    S/S 6 6    Pain Level 8/10 6/10     D/C home with Spouse, patient A & O X 3, D/C instructions reviewed, and sits independently.

## 2021-10-05 NOTE — Progress Notes (Signed)

## 2021-10-05 NOTE — Patient Instructions (Signed)
Sacroiliac injection was performed today. A combination of numbing medicine (lidocaine) plus a cortisone medicine (betamethasone) was injected. The injection was done under x-ray guidance. This procedure has been performed to help reduce low back and buttocks pain as well as potentially hip pain. The duration of this injection is variable lasting from hours to  Months. It may repeated if needed. 

## 2021-10-07 ENCOUNTER — Other Ambulatory Visit: Payer: Self-pay

## 2021-10-07 ENCOUNTER — Ambulatory Visit (INDEPENDENT_AMBULATORY_CARE_PROVIDER_SITE_OTHER): Admitting: Nurse Practitioner

## 2021-10-07 ENCOUNTER — Encounter: Payer: Self-pay | Admitting: Nurse Practitioner

## 2021-10-07 ENCOUNTER — Encounter: Admitting: Nurse Practitioner

## 2021-10-07 VITALS — BP 131/85 | HR 78 | Ht 65.0 in | Wt 160.0 lb

## 2021-10-07 DIAGNOSIS — Z139 Encounter for screening, unspecified: Secondary | ICD-10-CM

## 2021-10-07 DIAGNOSIS — F418 Other specified anxiety disorders: Secondary | ICD-10-CM | POA: Diagnosis not present

## 2021-10-07 DIAGNOSIS — D649 Anemia, unspecified: Secondary | ICD-10-CM

## 2021-10-07 DIAGNOSIS — J4520 Mild intermittent asthma, uncomplicated: Secondary | ICD-10-CM

## 2021-10-07 MED ORDER — ALBUTEROL SULFATE HFA 108 (90 BASE) MCG/ACT IN AERS
2.0000 | INHALATION_SPRAY | Freq: Four times a day (QID) | RESPIRATORY_TRACT | 2 refills | Status: AC | PRN
Start: 2021-10-07 — End: ?

## 2021-10-07 NOTE — Assessment & Plan Note (Signed)
Well-controlled continue current medication. Takes  setraline 200mg  daily .

## 2021-10-07 NOTE — Progress Notes (Signed)
° °  Gabriella Schmidt     MRN: [id number]      DOB: 1981/03/26   HPI Ms. Marinello is here for follow up and re-evaluation of chronic medical conditions, medication management and review of any available recent lab and radiology data.  Preventive health is updated, specifically Immunization.    The PT denies any adverse reactions to current medications since the last visit.  There are no new concerns.  There are no specific complaints    ROS Denies recent fever or chills. Denies chest congestion, productive cough or wheezing. Denies chest pains, palpitations and leg swelling Denies abdominal pain, nausea, vomiting,diarrhea or constipation.   Denies headaches, seizures, numbness, or tingling. Denies depression, anxiety or insomnia.    PE  BP 131/85 (BP Location: Right Arm, Patient Position: Sitting, Cuff Size: Normal)    Pulse 78    Ht 5\' 5"  (1.651 m)    Wt 160 lb (72.6 kg)    LMP 10/06/2021 (Exact Date)    SpO2 97%    BMI 26.63 kg/m   Patient alert and oriented and in no cardiopulmonary distress.  . Chest: Clear to auscultation bilaterally.  CVS: S1, S2 no murmurs, no S3.Regular rate.  ABD: Soft non tender.   Ext: No edema  MS: Adequate ROM spine, shoulders, hips and knees.  Psych: Good eye contact, normal affect. Memory intact not anxious or depressed appearing.  CNS: CN 2-12 intact, power,  normal throughout.no focal deficits noted.   Assessment & Plan

## 2021-10-07 NOTE — Assessment & Plan Note (Signed)
Well-controlled albuterol inhaler refilled today

## 2021-10-07 NOTE — Assessment & Plan Note (Signed)
CBC was normal we will continue to monitor. Lab Results  Component Value Date   WBC 7.2 10/01/2021   HGB 13.3 10/01/2021   HCT 40.3 10/01/2021   MCV 84 10/01/2021   PLT 373 10/01/2021

## 2021-10-07 NOTE — Patient Instructions (Addendum)
Please get your covid vaccines at your pharmacy. Please get your fasting lab work done 3 to 5 days before your next appointment    It is important that you exercise regularly at least 30 minutes 5 times a week.  Think about what you will eat, plan ahead. Choose " clean, green, fresh or frozen" over canned, processed or packaged foods which are more sugary, salty and fatty. 70 to 75% of food eaten should be vegetables and fruit. Three meals at set times with snacks allowed between meals, but they must be fruit or vegetables. Aim to eat over a 12 hour period , example 7 am to 7 pm, and STOP after  your last meal of the day. Drink water,generally about 64 ounces per day, no other drink is as healthy. Fruit juice is best enjoyed in a healthy way, by EATING the fruit.  Thanks for choosing [facility], we consider it a privelige to serve you.

## 2021-10-15 ENCOUNTER — Ambulatory Visit: Admitting: Physical Medicine & Rehabilitation

## 2021-10-27 ENCOUNTER — Other Ambulatory Visit: Payer: Self-pay | Admitting: Physical Medicine & Rehabilitation

## 2021-11-11 ENCOUNTER — Other Ambulatory Visit: Payer: Self-pay | Admitting: Internal Medicine

## 2021-11-11 DIAGNOSIS — G4700 Insomnia, unspecified: Secondary | ICD-10-CM

## 2021-11-19 ENCOUNTER — Encounter: Attending: Physical Medicine & Rehabilitation | Admitting: Physical Medicine & Rehabilitation

## 2021-11-19 ENCOUNTER — Encounter: Payer: Self-pay | Admitting: Physical Medicine & Rehabilitation

## 2021-11-19 ENCOUNTER — Other Ambulatory Visit: Payer: Self-pay

## 2021-11-19 VITALS — BP 127/85 | HR 77 | Ht 65.0 in | Wt 162.0 lb

## 2021-11-19 DIAGNOSIS — G5702 Lesion of sciatic nerve, left lower limb: Secondary | ICD-10-CM | POA: Diagnosis present

## 2021-11-19 MED ORDER — CYCLOBENZAPRINE HCL 5 MG PO TABS: 5.0000 mg | ORAL_TABLET | Freq: Every day | ORAL | 0 refills | Status: DC

## 2021-11-19 NOTE — Patient Instructions (Signed)
Piriformis Syndrome Piriformis syndrome is a condition that can cause pain and numbness in your buttocks and down the back of your leg. Piriformis syndrome happens when the small muscle that connects the base of your spine to your hip (piriformis muscle) presses on the nerve that runs down the back of your leg (sciatic nerve). The piriformis muscle helps your hip rotate and helps to bring your leg back and out. It also helps shift your weight to keep you stable while you are walking. The sciatic nerve runs under or through the piriformis muscle. Damage to the piriformis muscle can cause spasms that put pressure on the nerve below. This causes pain and discomfort while sitting and moving. The pain may feel as if it begins in the buttock and spreads (radiates) down your hip and thigh. What are the causes? This condition is caused by pressure on the sciatic nerve from the piriformis muscle. The piriformis muscle can get irritated with overuse, especially if other hip muscles are weak and the piriformis muscle has to do extra work. Piriformis syndrome can also occur after an injury, like a fall onto your buttocks. What increases the risk? You are more likely to develop this condition if you: Are a woman. Sit for long periods of time. Are a cyclist. Have weak buttocks muscles (gluteal muscles). What are the signs or symptoms? Symptoms of this condition include: Pain, tingling, or numbness that starts in the buttock and runs down the back of your leg (sciatica). Pain in the groin or thigh area. Your symptoms may get worse: The longer you sit. When you walk, run, or climb stairs. When straining to have a bowel movement. How is this diagnosed? This condition is diagnosed based on your symptoms, medical history, and physical exam. During the exam, your health care provider may: Move your leg into different positions to check for pain. Press on the muscles of your hip and buttock to see if that  increases your symptoms. You may also have tests, including: Imaging tests such as X-rays, CT, MRI, or ultrasound. Electromyogram (EMG). This test measures electrical signals sent by your nerves into the muscles. Nerve conduction study. This test measures how well electrical signals pass through your nerves. How is this treated? This condition may be treated by: Stopping all activities that cause pain or make your condition worse. Applying ice or using heat therapy. Taking medicines to reduce pain and swelling. Taking a muscle relaxer (muscle relaxant) to stop muscle spasms. Doing range-of-motion and strengthening exercises (physical therapy) as told by your health care provider. Having massage, acupuncture, or local electrical stimulation (transcutaneous electrical nerve stimulation, TENS). Getting an injection of medicine in the piriformis muscle. Your health care provider will choose the medicine based on your condition. He or she may inject: An anti-inflammatory medicine (steroid) to reduce swelling. A numbing medicine (local anesthetic) to block the pain. Botulinum toxin. The toxin blocks nerve impulses to specific muscles to reduce muscle tension. In rare cases, you may need surgery to cut the muscle and release pressure on the nerve if other treatments do not work. Follow these instructions at home: Activity Do not sit for long periods. Get up and walk around every 20 minutes or as often as told by your health care provider. When driving long distances, make sure to take frequent stops to get up and stretch. Use a cushion when you sit on hard surfaces. Do exercises as told by your health care provider. Return to your normal activities as told  by your health care provider. Ask your health care provider what activities are safe for you. Managing pain, stiffness, and swelling   If directed, apply heat to the area as often as told by your health care provider. Use the heat source that  your health care provider recommends, such as a moist heat pack or a heating pad. Place a towel between your skin and the heat source. Leave the heat on for 20-30 minutes. Remove the heat if your skin turns bright red. This is especially important if you are unable to feel pain, heat, or cold. You have a greater risk of getting burned. If directed, put ice on the injured area. To do this: Put ice in a plastic bag. Place a towel between your skin and the bag. Leave the ice on for 20 minutes, 2-3 times a day. Remove the ice if your skin turns bright red. This is very important. If you cannot feel pain, heat, or cold, you have a greater risk of damage to the area. General instructions Take over-the-counter and prescription medicines only as told by your health care provider. Ask your health care provider if the medicine prescribed to you requires you to avoid driving or using machinery. You may need to take these actions to prevent or treat constipation: Drink enough fluid to keep your urine pale yellow. Take over-the-counter or prescription medicines. Eat foods that are high in fiber, such as beans, whole grains, and fresh fruits and vegetables. Limit foods that are high in fat and processed sugars, such as fried or sweet foods. Keep all follow-up visits. This is important. How is this prevented? Do not sit for longer than 20 minutes at a time. When you sit, choose padded surfaces. Warm up and stretch before being active. Cool down and stretch after being active. Contact a health care provider if: Your pain and stiffness continue or get worse. Your leg or hip becomes weak. You have changes in your bowel function or bladder function. Summary Piriformis syndrome is a condition that can cause pain, tingling, and numbness in your buttocks and down the back of your leg. You may try applying heat or ice to relieve the pain. Do not sit for long periods. Get up and walk around every 20 minutes or  as often as told by your health care provider. This information is not intended to replace advice given to you by your health care provider. Make sure you discuss any questions you have with your health care provider. Document Revised: 03/02/2021 Document Reviewed: 03/02/2021 Elsevier Patient Education  [place]. Piriformis Syndrome Rehab Ask your health care provider which exercises are safe for you. Do exercises exactly as told by your health care provider and adjust them as directed. It is normal to feel mild stretching, pulling, tightness, or discomfort as you do these exercises. Stop right away if you feel sudden pain or your pain gets worse. Do not begin these exercises until told by your health care provider. Stretching and range-of-motion exercises These exercises warm up your muscles and joints and improve the movement and flexibility of your hip and pelvis. The exercises also help to relieve pain, numbness, and tingling. Nerve root  Sit on a firm surface that is high enough that you can swing your left / right foot freely. Place a folded towel under your left / right thigh. This is optional. Drop your head forward and round your back. While you keep your left / right foot relaxed, slowly straighten your  left / right knee until you feel a slight pull behind your knee or calf. If your leg is fully extended and you still do not feel a pull, slowly tilt your foot and toes toward you. Hold this position for __________ seconds. Slowly return your knee to its starting position. Hip rotation This is an exercise in which you lie on your back and stretch the muscles that rotate your hip (hip rotators) to stretch your buttocks. Lie on your back on a firm surface. Pull your left / right knee toward your same shoulder with your left / right hand until your knee is pointing toward the ceiling. Hold your left / right ankle with your other hand. Keeping your knee steady, gently pull your left  / right ankle toward your other shoulder until you feel a stretch in your buttocks. Hold this position for __________ seconds. Repeat __________ times. Complete this exercise __________ times a day. Hip extensor This is an exercise in which you lie on your back and pull your knee to your chest. Lie on your back on a firm surface. Both of your legs should be straight. Pull your left / right knee to your chest. Hold your leg in this position by holding on to the back of your thigh or the front of your knee. Hold this position for __________ seconds. Slowly return to the starting position. Repeat __________ times. Complete this exercise __________ times a day. Strengthening exercises These exercises build strength and endurance in your hip and thigh muscles. Endurance is the ability to use your muscles for a long time, even after they get tired. Straight leg raises, side-lying This exercise strengthens the muscles that rotate the leg at the hip and move it away from your body (hip abductors). Lie on your side with your left / right leg in the top position. Lie so your head, shoulder, knee, and hip line up. Bend your bottom knee to help you balance. Lift your top leg 4-6 inches (10-15 cm) while keeping your toes pointed straight ahead. Hold this position for __________ seconds. Slowly lower your leg to the starting position. Let your muscles relax completely after each repetition. Repeat __________ times. Complete this exercise __________ times a day. Hip abduction and rotation This is sometimes called quadruped (on hands and knees) exercises. Get on your hands and knees on a firm, lightly padded surface. Your hands should be directly below your shoulders, and your knees should be directly below your hips. Lift your left / right knee out to the side. Keep your knee bent. Do not twist your body. Hold this position for __________ seconds. Slowly lower your leg. Repeat __________ times. Complete  this exercise __________ times a day. Straight leg raises, prone This exercise stretches the muscles that move the hips (hip extensors). Lie on your abdomen on a firm surface (prone position). Tense the muscles in your buttocks and lift your left / right leg about 4 inches (10 cm). Keep your knee straight as you lift your leg. If you cannot lift your leg that high without arching your back, place a pillow under your hips. Hold this position for __________ seconds. Slowly lower your leg to the starting position. Let your muscles relax completely after each repetition. Repeat __________ times. Complete this exercise __________ times a day. This information is not intended to replace advice given to you by your health care provider. Make sure you discuss any questions you have with your health care provider. Document Revised: 03/10/2021 Document  Reviewed: 03/10/2021 Elsevier Patient Education  [place].

## 2021-11-19 NOTE — Progress Notes (Signed)
? ?Subjective:  ? ? Patient ID: Gabriella Schmidt, female    DOB: 11-21-1980, 41 y.o.   MRN: [id number] ? ?HPI ?41 year old female with chronic buttocks and coccygeal pain.  The patient indicates that her pain is worse on the left side.  She has pain on the buttocks itself as well as pain that radiates into the posterior thigh.  The patient has tried both physical therapy as well as currently performing home exercise program without much improvement.  She had bilateral sacroiliac injections which were quite helpful when performed in August 2022 but repeat injections performed 10/05/2021 were not helpful at all according to patient.  The patient has not experienced any new symptoms.  Pain is described as sharp and aching heat or ice help alleviate the pain.  The patient does get some relief from Tylenol with codeine, does not get any relief or does not tolerate tramadol. ?Has tried muscle relaxers in the past but not in combination with Tylenol No. 3. ?Pain Inventory ?Average Pain 8 ?Pain Right Now 8 ?My pain is sharp, dull, and aching ? ?In the last 24 hours, has pain interfered with the following? ?General activity 6 ?Relation with others 2 ?Enjoyment of life 5 ?What TIME of day is your pain at its worst? morning , daytime, and evening ?Sleep (in general) Fair ? ?Pain is worse with: walking, bending, sitting, standing, and some activites ?Pain improves with: heat/ice and medication ?Relief from Meds: 5 ? ?Family History  ?Problem Relation Age of Onset  ? Hypertension Mother   ? Hypertension Father   ? Cardiomyopathy Father   ? Hypertension Maternal Grandmother   ? Hypertension Maternal Grandfather   ? ?Social History  ? ?Socioeconomic History  ? Marital status: Married  ?  Spouse name: Jernee Murtaugh  ? Number of children: Not on file  ? Years of education: Not on file  ? Highest education level: Not on file  ?Occupational History  ? Not on file  ?Tobacco Use  ? Smoking status: Former  ? Smokeless tobacco: Never  ?Vaping  Use  ? Vaping Use: Former  ?Substance and Sexual Activity  ? Alcohol use: Not Currently  ? Drug use: Never  ? Sexual activity: Yes  ?  Birth control/protection: Surgical  ?  Comment: tubal  ?Other Topics Concern  ? Not on file  ?Social History Narrative  ? Not on file  ? ?Social Determinants of Health  ? ?Financial Resource Strain: Not on file  ?Food Insecurity: Not on file  ?Transportation Needs: Not on file  ?Physical Activity: Not on file  ?Stress: Not on file  ?Social Connections: Not on file  ? ?Past Surgical History:  ?Procedure Laterality Date  ? TOOTH EXTRACTION    ? TUBAL LIGATION N/A 06/29/2020  ? Procedure: POST PARTUM TUBAL LIGATION;  Surgeon: Osborne Oman, MD;  Location: [facility];  Service: Gynecology;  Laterality: N/A;  ? ?Past Surgical History:  ?Procedure Laterality Date  ? TOOTH EXTRACTION    ? TUBAL LIGATION N/A 06/29/2020  ? Procedure: POST PARTUM TUBAL LIGATION;  Surgeon: Osborne Oman, MD;  Location: [facility];  Service: Gynecology;  Laterality: N/A;  ? ?Past Medical History:  ?Diagnosis Date  ? Anxiety   ? Asthma   ? Phreesia 09/01/2020  ? Degenerative cervical disc   ? Depression   ? [employer]reesia 09/01/2020  ? Dizziness   ? Edema   ? Hypertension   ? ?BP 127/85   Pulse 77   Ht  5\' 5"  (1.651 m)   Wt 162 lb (73.5 kg)   SpO2 98%   BMI 26.96 kg/m?  ? ?Opioid Risk Score:   ?Fall Risk Score:  `1 ? ?Depression screen PHQ 2/9 ? ?Depression screen [facility] 2/9 11/19/2021 10/07/2021 10/05/2021 09/02/2021 08/27/2021 04/30/2021 04/29/2021  ?Decreased Interest 0 0 0 0 0 0 1  ?Down, Depressed, Hopeless 0 0 0 0 0 0 0  ?PHQ - 2 Score 0 0 0 0 0 0 1  ?Altered sleeping - 0 - - - - 0  ?Tired, decreased energy - 0 - - - - 0  ?Change in appetite - 0 - - - - 1  ?Feeling bad or failure about yourself  - 0 - - - - 0  ?Trouble concentrating - 0 - - - - 0  ?Moving slowly or fidgety/restless - 0 - - - - 0  ?Suicidal thoughts - 0 - - - - 0  ?PHQ-9 Score - 0 - - - - 2  ?Difficult doing work/chores - Not difficult at all - -  - - -  ?Some recent data might be hidden  ?  ? ?Review of Systems  ?Musculoskeletal:  Positive for back pain.  ?     Leg pain ?Knee pain  ?All other systems reviewed and are negative. ? ?   ?Objective:  ? Physical Exam ?Vitals and nursing note reviewed.  ?Constitutional:   ?   Appearance: She is normal weight.  ?HENT:  ?   Head: Normocephalic and atraumatic.  ?Eyes:  ?   Extraocular Movements: Extraocular movements intact.  ?   Conjunctiva/sclera: Conjunctivae normal.  ?   Pupils: Pupils are equal, round, and reactive to light.  ?Musculoskeletal:     ?   General: Tenderness present.  ?   Comments: Tenderness with even light palpation left buttocks midway between sacrum and greater trochanter of left femur. ?FABERs produces pain left buttock and groin ?Crossed abduction internal rotation causes pain in the buttocks region on the left side. ?Figure-of-four causes left buttock pain  ?Skin: ?   General: Skin is warm and dry.  ?Neurological:  ?   General: No focal deficit present.  ?   Mental Status: She is alert and oriented to person, place, and time.  ?Psychiatric:     ?   Mood and Affect: Mood normal.     ?   Behavior: Behavior normal.  ? ?No pain to palpation lumbar area ?Good lumbar range of motion ? ? ? ?   ?Assessment & Plan:  ?#1.  Left buttocks pain inconsistent result with sacroiliac injection, patient has some sciatic discomfort as well.  Discussed piriformis syndrome as potential pain generator we discussed adding cyclobenzaprine 5 to 10 mg nightly.  Have added fire hydrant exercises in quadruped ?In addition we will do left piriformis injection under ultrasound guidance.  If patient has no significant relief with this, consider repeat lumbar MRI, last MRI was in 2019 ? ?

## 2021-11-23 ENCOUNTER — Other Ambulatory Visit: Payer: Self-pay

## 2021-11-23 ENCOUNTER — Ambulatory Visit (INDEPENDENT_AMBULATORY_CARE_PROVIDER_SITE_OTHER): Admitting: Adult Health

## 2021-11-23 ENCOUNTER — Encounter: Payer: Self-pay | Admitting: Adult Health

## 2021-11-23 VITALS — BP 138/86 | HR 87 | Ht 65.0 in | Wt 162.0 lb

## 2021-11-23 DIAGNOSIS — R350 Frequency of micturition: Secondary | ICD-10-CM | POA: Diagnosis not present

## 2021-11-23 DIAGNOSIS — N6321 Unspecified lump in the left breast, upper outer quadrant: Secondary | ICD-10-CM

## 2021-11-23 DIAGNOSIS — R110 Nausea: Secondary | ICD-10-CM

## 2021-11-23 DIAGNOSIS — N6314 Unspecified lump in the right breast, lower inner quadrant: Secondary | ICD-10-CM

## 2021-11-23 DIAGNOSIS — N644 Mastodynia: Secondary | ICD-10-CM | POA: Diagnosis not present

## 2021-11-23 DIAGNOSIS — Z3202 Encounter for pregnancy test, result negative: Secondary | ICD-10-CM | POA: Diagnosis not present

## 2021-11-23 DIAGNOSIS — R5383 Other fatigue: Secondary | ICD-10-CM

## 2021-11-23 DIAGNOSIS — R635 Abnormal weight gain: Secondary | ICD-10-CM

## 2021-11-23 DIAGNOSIS — R809 Proteinuria, unspecified: Secondary | ICD-10-CM | POA: Diagnosis not present

## 2021-11-23 LAB — POCT URINE PREGNANCY: Preg Test, Ur: NEGATIVE

## 2021-11-23 LAB — POCT URINALYSIS DIPSTICK
Blood, UA: NEGATIVE
Leukocytes, UA: NEGATIVE
Nitrite, UA: NEGATIVE
Protein, UA: POSITIVE — AB

## 2021-11-23 NOTE — Addendum Note (Signed)
Addended by: Janece Canterbury on: 11/23/2021 12:28 PM ? ? Modules accepted: Orders ? ?

## 2021-11-23 NOTE — Progress Notes (Signed)
?  Subjective:  ?  ? Patient ID: Gabriella Schmidt, female   DOB: November 09, 1980, 41 y.o.   MRN: [id number] ? ?HPI ?Gabriella Schmidt is a 41 year old white female, married, G1P0101, in with multiple complaints, feels like she did when pregnant, has nausea, esp in evening,cramping,weight gain,frequent urination,fatigue, more headaches, breast tenderness and back pain, and has fluttery feeling at times. Husband has had vasectomy and she is on OCs. ?Lab Results  ?Component Value Date  ? DIAGPAP  02/21/2020  ?  - Negative for intraepithelial lesion or malignancy (NILM)  ? [place] Negative 02/21/2020  ? PCP is Charlyn Minerva NP ?Review of Systems ?See HPI for positives ?Denies problems with BM ? ?Reviewed past medical,surgical, social and family history. Reviewed medications and allergies.  ?   ?Objective:  ? Physical Exam ?BP 138/86 (BP Location: Right Arm, Patient Position: Sitting, Cuff Size: Normal)   Pulse 87   Ht '5\' 5"'$  (1.651 m)   Wt 162 lb (73.5 kg)   LMP 09/29/2021 (Exact Date)   Breastfeeding No   BMI 26.96 kg/m?   ?  UPT is negative. ?Urine dipstick 3+protein ?Skin warm and dry.Lungs: clear to ausculation bilaterally. Cardiovascular: regular rate and rhythm.  ?   Breasts:no retraction or nipple discharge of either breast has, bilateral regular irregularities and tenderness and has mass at 3 o'clock on right about 4 FB from nipple and on left at 1 o'clock ? Upstream - 11/23/21 1110   ? ?  ? Pregnancy Intention Screening  ? Does the patient want to become pregnant in the next year? No   ? Does the patient's partner want to become pregnant in the next year? No   ? Would the patient like to discuss contraceptive options today? No   ?  ? Contraception Wrap Up  ? Current Method Oral Contraceptive;Vasectomy   ? End Method Oral Contraceptive;Vasectomy   ? Contraception Counseling Provided No   ? ?  ?  ? ?  ?  ?Assessment:  ?   ?1. Pregnancy test negative ?- POCT urine pregnancy ? ?2. Urinary frequency ?- POCT urinalysis dipstick ? ?3.  Proteinuria, unspecified type ?Will send urine for UA C&S ? ?4. Mass of lower inner quadrant of right breast ?Diagnostic mammogram and [name] scheduled for her 12/10/21 at [facility] at 2 pm  ?- US BREAST LTD UNI RIGHT INC AXILLA; Future ?- MM DIAG BREAST TOMO BILATERAL; Future ? ?5. Mass of upper outer quadrant of left breast ?Get mammogram and [name] 12/10/21  ?- MM DIAG BREAST TOMO BILATERAL; Future ?- US BREAST LTD UNI LEFT INC AXILLA; Future ? ?6. Breast tenderness ?- US BREAST LTD UNI RIGHT INC AXILLA; Future ?- MM DIAG BREAST TOMO BILATERAL; Future ?- US BREAST LTD UNI LEFT INC AXILLA; Future ? ?7. Nausea ? ?8. Fatigue, unspecified type ?Will check labs  ?- CBC ?- Comprehensive metabolic panel ?- TSH ? ?9. Weight gain ?- TSH  ?   ?Plan:  ?   ?Follow up in 3 weeks ?   ?

## 2021-11-24 LAB — COMPREHENSIVE METABOLIC PANEL
ALT: 26 IU/L (ref 0–32)
AST: 21 IU/L (ref 0–40)
Albumin/Globulin Ratio: 1.5 (ref 1.2–2.2)
Albumin: 4.5 g/dL (ref 3.8–4.8)
Alkaline Phosphatase: 78 IU/L (ref 44–121)
BUN/Creatinine Ratio: 20 (ref 9–23)
BUN: 12 mg/dL (ref 6–20)
Bilirubin Total: 0.3 mg/dL (ref 0.0–1.2)
CO2: 21 mmol/L (ref 20–29)
Calcium: 9.4 mg/dL (ref 8.7–10.2)
Chloride: 104 mmol/L (ref 96–106)
Creatinine, Ser: 0.59 mg/dL (ref 0.57–1.00)
Globulin, Total: 3 g/dL (ref 1.5–4.5)
Glucose: 93 mg/dL (ref 70–99)
Potassium: 4.8 mmol/L (ref 3.5–5.2)
Sodium: 141 mmol/L (ref 134–144)
Total Protein: 7.5 g/dL (ref 6.0–8.5)
eGFR: 118 mL/min/{1.73_m2} (ref >59)

## 2021-11-24 LAB — CBC
Hematocrit: 40.7 % (ref 34.0–46.6)
Hemoglobin: 13.4 g/dL (ref 11.1–15.9)
MCH: 27.9 pg (ref 26.6–33.0)
MCHC: 32.9 g/dL (ref 31.5–35.7)
MCV: 85 fL (ref 79–97)
Platelets: 374 10*3/uL (ref 150–450)
RBC: 4.8 x10E6/uL (ref 3.77–5.28)
RDW: 13.4 % (ref 11.7–15.4)
WBC: 8 10*3/uL (ref 3.4–10.8)

## 2021-11-24 LAB — URINALYSIS
Bilirubin, UA: NEGATIVE
Glucose, UA: NEGATIVE
Leukocytes,UA: NEGATIVE
Nitrite, UA: NEGATIVE
RBC, UA: NEGATIVE
Specific Gravity, UA: >=1.03 — AB (ref 1.005–1.030)
Urobilinogen, Ur: 0.2 mg/dL (ref 0.2–1.0)
pH, UA: 5.5 (ref 5.0–7.5)

## 2021-11-24 LAB — TSH: TSH: 2.04 u[IU]/mL (ref 0.450–4.500)

## 2021-11-26 LAB — URINE CULTURE

## 2021-12-10 ENCOUNTER — Other Ambulatory Visit (HOSPITAL_COMMUNITY): Payer: Self-pay | Admitting: Adult Health

## 2021-12-10 ENCOUNTER — Ambulatory Visit (HOSPITAL_COMMUNITY)
Admission: RE | Admit: 2021-12-10 | Discharge: 2021-12-10 | Disposition: A | Discharge status: Home / Self Care | Source: Ambulatory Visit | Attending: Adult Health | Admitting: Adult Health

## 2021-12-10 ENCOUNTER — Encounter (HOSPITAL_COMMUNITY): Payer: Self-pay

## 2021-12-10 ENCOUNTER — Ambulatory Visit (INDEPENDENT_AMBULATORY_CARE_PROVIDER_SITE_OTHER): Admitting: Adult Health

## 2021-12-10 ENCOUNTER — Encounter: Payer: Self-pay | Admitting: Adult Health

## 2021-12-10 ENCOUNTER — Other Ambulatory Visit: Payer: Self-pay

## 2021-12-10 VITALS — BP 135/84 | HR 91 | Ht 65.0 in | Wt 163.0 lb

## 2021-12-10 DIAGNOSIS — N6321 Unspecified lump in the left breast, upper outer quadrant: Secondary | ICD-10-CM | POA: Diagnosis present

## 2021-12-10 DIAGNOSIS — N644 Mastodynia: Secondary | ICD-10-CM

## 2021-12-10 DIAGNOSIS — R110 Nausea: Secondary | ICD-10-CM

## 2021-12-10 DIAGNOSIS — R635 Abnormal weight gain: Secondary | ICD-10-CM

## 2021-12-10 DIAGNOSIS — N6314 Unspecified lump in the right breast, lower inner quadrant: Secondary | ICD-10-CM | POA: Insufficient documentation

## 2021-12-10 DIAGNOSIS — N9489 Other specified conditions associated with female genital organs and menstrual cycle: Secondary | ICD-10-CM

## 2021-12-10 DIAGNOSIS — Z01419 Encounter for gynecological examination (general) (routine) without abnormal findings: Secondary | ICD-10-CM | POA: Diagnosis not present

## 2021-12-10 DIAGNOSIS — Z020 Encounter for examination for admission to educational institution: Secondary | ICD-10-CM | POA: Insufficient documentation

## 2021-12-10 DIAGNOSIS — R928 Other abnormal and inconclusive findings on diagnostic imaging of breast: Secondary | ICD-10-CM

## 2021-12-10 MED ORDER — ORILISSA 150 MG PO TABS: ORAL_TABLET | ORAL | 0 refills | Status: DC

## 2021-12-10 NOTE — Progress Notes (Signed)
Patient ID: Gabriella Schmidt, female   DOB: [birth year], 41 y.o.   MRN: [id number] ?History of Present Illness: ?Khai is a 41 year old white female,married, G1P0101, in for well woman gyn exam. She is complaining of nausea, cramping with period and sex, periods irregular is on OCs. Has gained weight, and feels like she may have endometriosis , her mom did. ?Has breast tenderness too, was seen 11/23/21 and had breast ,mass, mammogram today.  ?Her mom is with her today. ?Lab Results  ?Component Value Date  ? DIAGPAP  02/21/2020  ?  - Negative for intraepithelial lesion or malignancy (NILM)  ? [place] Negative 02/21/2020  ? PCP is F. Paseda NP ? ? ?Current Medications, Allergies, Past Medical History, Past Surgical History, Family History and Social History were reviewed in [employer] record.   ? ? ?Review of Systems: ?Patient denies any headaches, hearing loss, fatigue, blurred vision, shortness of breath, chest pain, abdominal pain, problems with bowel movements, urination, or intercourse. No joint pain or mood swings.  ?See HPI for positives ? ? ?Physical Exam:BP 135/84 (BP Location: Right Arm, Patient Position: Sitting, Cuff Size: Normal)   Pulse 91   Ht '5\' 5"'$  (1.651 m)   Wt 163 lb (73.9 kg)   LMP 09/29/2021 (Exact Date)   Breastfeeding No   BMI 27.12 kg/m?   ?General:  Well developed, well nourished, no acute distress ?Skin:  Warm and dry ?Neck:  Midline trachea, normal thyroid, good ROM, no lymphadenopathy ?Lungs; Clear to auscultation bilaterally ? Breasts:no retraction or nipple discharge of either breast has, bilateral regular irregularities and tenderness and has mass at 3 o'clock on right about 4 FB from nipple and on left at 1 o'clock,has mammogram today ?Cardiovascular: Regular rate and rhythm ?Abdomen:  Soft, non tender, no hepatosplenomegaly ?Pelvic:  External genitalia is normal in appearance, no lesions.  The vagina is normal in appearance. Urethra has no lesions or masses.  The cervix is bulbous.  Uterus is felt to be normal size, shape, and contour, +tender.  No adnexal masses, +tenderness noted.Bladder is non tender, no masses felt. ?Rectal: Deferred ?Extremities/musculoskeletal:  No swelling or varicosities noted, no clubbing or cyanosis ?Psych:  No mood changes, alert and cooperative,seems happy ?AA is 0 ? ?  12/10/2021  ? 10:15 AM 11/19/2021  ?  3:13 PM 10/07/2021  ? 10:00 AM  ?Depression screen PHQ 2/9  ?Decreased Interest 0 0 0  ?Down, Depressed, Hopeless 0 0 0  ?PHQ - 2 Score 0 0 0  ?Altered sleeping 0  0  ?Tired, decreased energy 3  0  ?Change in appetite 0  0  ?Feeling bad or failure about yourself  0  0  ?Trouble concentrating 0  0  ?Moving slowly or fidgety/restless 0  0  ?Suicidal thoughts 0  0  ?PHQ-9 Score 3  0  ?Difficult doing work/chores   Not difficult at all  ?  ? ?  12/10/2021  ? 10:15 AM 04/29/2021  ?  4:44 PM 03/25/2021  ?  4:07 PM 02/18/2021  ?  3:27 PM  ?GAD 7 : Generalized Anxiety Score  ?Nervous, Anxious, on Edge 1 0 2   ?Control/stop worrying 0 0 3   ?Worry too much - different things 1 0 1   ?Trouble relaxing 0 0 2   ?Restless 0 0 0   ?Easily annoyed or irritable 0 0 0   ?Afraid - awful might happen 0 0 1   ?Total GAD 7 Score 2  0 9   ?Anxiety Difficulty  Not difficult at all Somewhat difficult Somewhat difficult  ? ?  ? Upstream - 12/10/21 1014   ? ?  ? Pregnancy Intention Screening  ? Does the patient want to become pregnant in the next year? No   ? Does the patient's partner want to become pregnant in the next year? No   ? Would the patient like to discuss contraceptive options today? Yes   ?  ? Contraception Wrap Up  ? Current Method Female Sterilization;Vasectomy;Oral Contraceptive   ? End Method Female Sterilization;Vasectomy   ? Contraception Counseling Provided No   ? ?  ?  ? ?  ? Examination chaperoned by Celene Squibb LPN ? ? ?Impression and Plan: ?1. Encounter for annual routine gynecological examination ?Pap and physical in 1 year ? ? ?2. Weight  gain ? ?3. Nausea ?Seem a little better ? ?4. Breast tenderness ?Has mammogram today ? ?5. Mass of upper outer quadrant of left breast ?Has mammogram today ? ?6. Mass of lower inner quadrant of right breast ?Has mammogram today ? ?7. Uterine cramping ?Stop OCs and start Orlissa 150 mg daily 1 daily with next period, about day 3-4 ?Given samples of Orlissa 150 mg  and handout to see this helps with her discomfort ?She is aware the only way to diagnosis for sure is laparoscopic surgery ? ?Meds ordered this encounter  ?Medications  ? Elagolix Sodium (ORILISSA) 150 MG TABS  ?  Sig: Take 1 daily  ?  Dispense:  21 tablet  ?  Refill:  0  ?  Order Specific Question:   Supervising Provider  ?  Answer:   Tania Ade H [[id number]]  ? Follow up in about 3 weeks  ? ? ? ?  ?  ?

## 2021-12-17 ENCOUNTER — Encounter (HOSPITAL_BASED_OUTPATIENT_CLINIC_OR_DEPARTMENT_OTHER): Admitting: Physical Medicine & Rehabilitation

## 2021-12-17 ENCOUNTER — Encounter: Payer: Self-pay | Admitting: Physical Medicine & Rehabilitation

## 2021-12-17 VITALS — BP 111/74 | HR 100 | Temp 98.0°F | Ht 65.0 in | Wt 165.0 lb

## 2021-12-17 DIAGNOSIS — G5702 Lesion of sciatic nerve, left lower limb: Secondary | ICD-10-CM | POA: Diagnosis not present

## 2021-12-17 NOTE — Patient Instructions (Signed)
Piriformis injection on left Lidocaine and celestone injected  ? ?

## 2021-12-17 NOTE — Progress Notes (Signed)
LEFT piriformis injection under ultrasound guidance ? ?Indication left piriformis syndrome which has not responded to physical therapy or medication management ? ?Informed consent was obtained after the patient reviewed and anatomy and potential risks as well as potential benefits. She  has given written consent ?Patient placed prone on exam table area pre-scan and with curvilinear 4 Hz transducer starting at the PSIS then identifying the PI IS and then orienting transducer along the long axis of the piriformis muscle. Area was marked and prepped with Betadine and draped in a sterile manner. 3 cc of lidocaine were infiltrate into the skin and subcutaneous tissue using a 25-gauge 1.5 inch needle. Then a 80 mm echo block needle was inserted under ultrasound guidance targeting the piriformis muscle. Needle tip entered piriformis muscle then a solution containing one ML of Celestone 6 mg +4 ML of 1% lidocaine was injected. Patient tolerated procedure well  ?

## 2021-12-22 ENCOUNTER — Ambulatory Visit (HOSPITAL_COMMUNITY)
Admission: RE | Admit: 2021-12-22 | Discharge: 2021-12-22 | Disposition: A | Discharge status: Home / Self Care | Source: Ambulatory Visit | Attending: Nurse Practitioner | Admitting: Nurse Practitioner

## 2021-12-22 ENCOUNTER — Other Ambulatory Visit (HOSPITAL_COMMUNITY): Payer: Self-pay | Admitting: Nurse Practitioner

## 2021-12-22 ENCOUNTER — Ambulatory Visit (HOSPITAL_COMMUNITY)
Admission: RE | Admit: 2021-12-22 | Discharge: 2021-12-22 | Disposition: A | Discharge status: Home / Self Care | Source: Ambulatory Visit | Attending: Adult Health | Admitting: Adult Health

## 2021-12-22 DIAGNOSIS — R928 Other abnormal and inconclusive findings on diagnostic imaging of breast: Secondary | ICD-10-CM

## 2021-12-23 ENCOUNTER — Encounter: Payer: Self-pay | Admitting: Adult Health

## 2021-12-23 DIAGNOSIS — D242 Benign neoplasm of left breast: Secondary | ICD-10-CM | POA: Insufficient documentation

## 2021-12-23 HISTORY — DX: Benign neoplasm of left breast: D24.2

## 2021-12-23 LAB — SURGICAL PATHOLOGY

## 2021-12-31 ENCOUNTER — Ambulatory Visit (INDEPENDENT_AMBULATORY_CARE_PROVIDER_SITE_OTHER): Admitting: Adult Health

## 2021-12-31 ENCOUNTER — Encounter: Payer: Self-pay | Admitting: Adult Health

## 2021-12-31 VITALS — BP 124/82 | HR 86 | Ht 65.0 in | Wt 168.0 lb

## 2021-12-31 DIAGNOSIS — N9489 Other specified conditions associated with female genital organs and menstrual cycle: Secondary | ICD-10-CM | POA: Diagnosis not present

## 2021-12-31 MED ORDER — ORILISSA 150 MG PO TABS: ORAL_TABLET | ORAL | 6 refills | Status: DC

## 2021-12-31 NOTE — Progress Notes (Signed)
?  Subjective:  ?  ? Patient ID: Gabriella Schmidt, female   DOB: Dec 01, 1980, 41 y.o.   MRN: [id number] ? ?HPI ?Gabriella Schmidt is a 41 year old white female, married, G1P0101, back in follow up on starting Orlissa and is better, still cramping. 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 ? ?Lab Results  ?Component Value Date  ? DIAGPAP  02/21/2020  ?  - Negative for intraepithelial lesion or malignancy (NILM)  ? [place] Negative 02/21/2020  ? PCP is F. Paseda  ? ?Review of Systems ?No pain with sex ?Still having uterine cramping ?Has had few hot flashes and felt irritable  ?No nausea ?Reviewed past medical,surgical, social and family history. Reviewed medications and allergies.  ?   ?Objective:  ? Physical Exam ?BP 124/82 (BP Location: Right Arm, Patient Position: Sitting, Cuff Size: Normal)   Pulse 86   Ht '5\' 5"'$  (1.651 m)   Wt 168 lb (76.2 kg)   BMI 27.96 kg/m?   ?  Skin warm and dry.  Lungs: clear to ausculation bilaterally. Cardiovascular: regular rate and rhythm.  ? Upstream - 12/31/21 1122   ? ?  ? Pregnancy Intention Screening  ? Does the patient want to become pregnant in the next year? No   ? Does the patient's partner want to become pregnant in the next year? No   ? Would the patient like to discuss contraceptive options today? No   ?  ? Contraception Wrap Up  ? Current Method Female Sterilization;Vasectomy   ? End Method Female Sterilization;Vasectomy   ? Contraception Counseling Provided No   ? ?  ?  ? ?  ?  ?Assessment:  ?   ? 1. Uterine cramping ?Will continue Orlissa 150 mg 1 daily ?Meds ordered this encounter  ?Medications  ? Elagolix Sodium (ORILISSA) 150 MG TABS  ?  Sig: Take 1 daily  ?  Dispense:  30 tablet  ?  Refill:  6  ?  Order Specific Question:   Supervising Provider  ?  Answer:   Tania Ade H [[id number]]  ?  ?   ?Plan:  ?   ?Follow up in 3 months  ?   ?

## 2022-01-05 ENCOUNTER — Encounter: Payer: Self-pay | Admitting: Nurse Practitioner

## 2022-01-05 ENCOUNTER — Ambulatory Visit (INDEPENDENT_AMBULATORY_CARE_PROVIDER_SITE_OTHER): Admitting: Nurse Practitioner

## 2022-01-05 DIAGNOSIS — E663 Overweight: Secondary | ICD-10-CM | POA: Insufficient documentation

## 2022-01-05 DIAGNOSIS — I10 Essential (primary) hypertension: Secondary | ICD-10-CM | POA: Diagnosis not present

## 2022-01-05 DIAGNOSIS — M5137 Other intervertebral disc degeneration, lumbosacral region: Secondary | ICD-10-CM

## 2022-01-05 MED ORDER — SEMAGLUTIDE-WEIGHT MANAGEMENT 0.25 MG/0.5ML ~~LOC~~ SOAJ
0.2500 mg | SUBCUTANEOUS | 0 refills | Status: AC
Start: 2022-01-05 — End: 2022-02-02

## 2022-01-05 NOTE — Progress Notes (Addendum)
? ?  Gabriella Schmidt     MRN: [id number]      DOB: 27-May-1981 ? ? ?HPI ?[name] with past medical history of hypertension, hyperlipidemia, depression with anxiety, degenerative disc disease, asthma is here for discuss  weight loss management,  states that she has been gaining weight recently , not exercising  due to due to sciatia, and scoliosis. Eats 3 meals a day ,eats 2 snacks daily fruits , skinny popcorn for snacks states that she has never been on medication for weight loss in the past. She was previously on medications for hypertension ,She feels that losing weight and preventing weight gain will help maintain normal BP and prevent her from requiring medications again.  ? ?ROS ?Denies recent fever or chills. ?Denies sinus pressure, nasal congestion, ear pain or sore throat. ?Denies chest congestion, productive cough or wheezing. ?Denies chest pains, palpitations and leg swelling ?Denies abdominal pain, nausea, vomiting,diarrhea or constipation.   ?Denies depression, anxiety or insomnia. ? ? ?PE ? ?BP 121/81 (BP Location: Right Arm, Patient Position: Sitting, Cuff Size: Large)   Pulse 93   Ht '5\' 5"'$  (1.651 m)   Wt 168 lb (76.2 kg)   LMP 12/12/2021 (Exact Date)   SpO2 98%   Breastfeeding No   BMI 27.96 kg/m?  ? ?Patient alert and oriented and in no cardiopulmonary distress. ? ?Chest: Clear to auscultation bilaterally. ? ?CVS: S1, S2 no murmurs, no S3.Regular rate. ? ?ABD: Soft non tender.  ? ?Ext: No edema ? ?MS: Adequate ROM spine, shoulders, hips and knees. ? ?Psych: Good eye contact, normal affect. Memory intact not anxious or depressed appearing. ? ? ? ?Assessment & Plan ? ?Overweight (BMI 25.0-29.9) ?Wt Readings from Last 3 Encounters:  ?01/05/22 168 lb (76.2 kg)  ?12/31/21 168 lb (76.2 kg)  ?12/17/21 165 lb (74.8 kg)  ?does note exercise due to her degenerative disc disease ?does not feel good about her weight ?Current BMI 27.96, has gained 3lbs in a month. ? has history of hypertension but not  currently on medication. ?It is reasonable to start pt on wegovy to help loose weight and prevent going back on BP medications. ? ? ?Patient educated on the need to increase intake of water meals consisting of protein and vegetables, less carbohydrate drink at least 64 ounces of water daily importance of portion control discussed with patient, exercise daily as tolerated. ?Rx Wegovy 0.'25mg'$  once weekly  injection, will most likely need a PA. ? ?Degenerative disc disease at L5-S1 level ?Currently followed by physical medicine ?On gabapentin 300 mg 3 times daily, Flexeril 5 mg as needed bedtime ?Patient advised to maintain close follow-up with physical medicine.  ? ?Chronic hypertension ?BP Readings from Last 3 Encounters:  ?01/05/22 121/81  ?12/31/21 124/82  ?12/17/21 111/74  ? ?Has been on lisinopril and amlodipine in the past for BP ?BP has been well controlled without medications ?wants to maintain normal BP by watching her weight. ?DASH diet advised, exercise daily at least 150 minutes a week.   ?

## 2022-01-05 NOTE — Assessment & Plan Note (Addendum)
Wt Readings from Last 3 Encounters:  ?01/05/22 168 lb (76.2 kg)  ?12/31/21 168 lb (76.2 kg)  ?12/17/21 165 lb (74.8 kg)  ?does note exercise due to her degenerative disc disease ?does not feel good about her weight ?Current BMI 27.96, has gained 3lbs in a month. ? has history of hypertension but not currently on medication. ?It is reasonable to start pt on wegovy to help loose weight and prevent going back on BP medications. ? ? ?Patient educated on the need to increase intake of water meals consisting of protein and vegetables, less carbohydrate drink at least 64 ounces of water daily importance of portion control discussed with patient, exercise daily as tolerated. ?Rx Wegovy 0.'25mg'$  once weekly  injection, will most likely need a PA. ?

## 2022-01-05 NOTE — Assessment & Plan Note (Signed)
Currently followed by physical medicine ?On gabapentin 300 mg 3 times daily, Flexeril 5 mg as needed bedtime ?Patient advised to maintain close follow-up with physical medicine.  ?

## 2022-01-05 NOTE — Patient Instructions (Addendum)
?  Start wegovy 0.25 mg once weekly injection for weight loss ? ?It is important that you exercise regularly at least 30 minutes 5 times a week.  ?Think about what you will eat, plan ahead. ?Choose " clean, green, fresh or frozen" over canned, processed or packaged foods which are more sugary, salty and fatty. ?70 to 75% of food eaten should be vegetables and fruit. ?Three meals at set times with snacks allowed between meals, but they must be fruit or vegetables. ?Aim to eat over a 12 hour period , example 7 am to 7 pm, and STOP after  your last meal of the day. ?Drink water,generally about 64 ounces per day, no other drink is as healthy. Fruit juice is best enjoyed in a healthy way, by EATING the fruit. ? ?Thanks for choosing [facility], we consider it a privelige to serve you.  ?

## 2022-01-06 NOTE — Assessment & Plan Note (Signed)
BP Readings from Last 3 Encounters:  ?01/05/22 121/81  ?12/31/21 124/82  ?12/17/21 111/74  ? ?Has been on lisinopril and amlodipine in the past for BP ?BP has been well controlled without medications ?wants to maintain normal BP by watching her weight. ?DASH diet advised, exercise daily at least 150 minutes a week.  ?

## 2022-01-11 ENCOUNTER — Telehealth: Payer: Self-pay

## 2022-01-11 NOTE — Telephone Encounter (Signed)
Called pt to let her know that [facility] [place] not [place] wegovy even with medical necessity no answer left vm passing info to [facility] ?

## 2022-01-13 NOTE — Telephone Encounter (Signed)
Pt aware.

## 2022-01-15 ENCOUNTER — Other Ambulatory Visit: Payer: Self-pay

## 2022-01-17 ENCOUNTER — Other Ambulatory Visit: Payer: Self-pay | Admitting: Physical Medicine & Rehabilitation

## 2022-02-01 ENCOUNTER — Other Ambulatory Visit: Payer: Self-pay | Admitting: Nurse Practitioner

## 2022-02-01 DIAGNOSIS — G4700 Insomnia, unspecified: Secondary | ICD-10-CM

## 2022-02-04 ENCOUNTER — Encounter: Attending: Physical Medicine & Rehabilitation | Admitting: Physical Medicine & Rehabilitation

## 2022-02-04 ENCOUNTER — Encounter: Payer: Self-pay | Admitting: Physical Medicine & Rehabilitation

## 2022-02-04 VITALS — BP 121/80 | HR 100 | Ht 65.0 in | Wt 169.6 lb

## 2022-02-04 DIAGNOSIS — Z5181 Encounter for therapeutic drug level monitoring: Secondary | ICD-10-CM | POA: Insufficient documentation

## 2022-02-04 DIAGNOSIS — G894 Chronic pain syndrome: Secondary | ICD-10-CM | POA: Insufficient documentation

## 2022-02-04 DIAGNOSIS — Z79891 Long term (current) use of opiate analgesic: Secondary | ICD-10-CM | POA: Insufficient documentation

## 2022-02-04 DIAGNOSIS — M5416 Radiculopathy, lumbar region: Secondary | ICD-10-CM | POA: Insufficient documentation

## 2022-02-04 MED ORDER — ACETAMINOPHEN-CODEINE 300-60 MG PO TABS: 1.0000 | ORAL_TABLET | Freq: Two times a day (BID) | ORAL | 2 refills | Status: DC | PRN

## 2022-02-04 MED ORDER — DIAZEPAM 10 MG PO TABS: 10.0000 mg | ORAL_TABLET | Freq: Once | ORAL | 0 refills | Status: AC

## 2022-02-04 NOTE — Progress Notes (Signed)
Subjective:    Patient ID: Gabriella Schmidt, female    DOB: [birth year], 41 y.o.   MRN: [id number]  HPI  Chief complaint and tailbone pain 41 year old female with pain onset during pregnancy.  After delivery her pain persisted.  She has tried physical therapy as well as oral medication without much improvement.  She has pain that goes from the low back to the left buttock area.  There is also pain on both sides of the tailbone.  No recent falls or trauma. No improvement with pyriformis injection  Pain  primarily Left sided , also has pain on sides of tail bone  No numbness or tingling in the feet no progressive weakness of the lower extremities No bowel or bladder dysfunction Had an MRI in 2019 which did not show any significant nerve root impingement in the lumbar spine Pain Inventory Average Pain 8 Pain Right Now 8 My pain is sharp, burning, and stabbing  In the last 24 hours, has pain interfered with the following? General activity 6 Relation with others 3 Enjoyment of life 5 What TIME of day is your pain at its worst? daytime, evening, and night Sleep (in general) Fair  Pain is worse with: walking, bending, sitting, and standing Pain improves with: heat/ice and medication Relief from Meds: 5  Family History  Problem Relation Age of Onset   Hypertension Mother    Hypertension Father    Cardiomyopathy Father    Hypertension Maternal Grandmother    Hypertension Maternal Grandfather    Social History   Socioeconomic History   Marital status: Married    Spouse name: Terri Rorrer   Number of children: Not on file   Years of education: Not on file   Highest education level: Not on file  Occupational History   Not on file  Tobacco Use   Smoking status: Former   Smokeless tobacco: Never  Vaping Use   Vaping Use: Former  Substance and Sexual Activity   Alcohol use: Not Currently   Drug use: Never   Sexual activity: Yes    Birth control/protection: Surgical     Comment: tubal  Other Topics Concern   Not on file  Social History Narrative   Not on file   Social Determinants of Health   Financial Resource Strain: Low Risk    Difficulty of Paying Living Expenses: Not hard at all  Food Insecurity: No Food Insecurity   Worried About Charity fundraiser in the Last Year: Never true   [place] in the Last Year: Never true  Transportation Needs: No Transportation Needs   Lack of Transportation (Medical): No   Lack of Transportation (Non-Medical): No  Physical Activity: Insufficiently Active   Days of Exercise per Week: 2 days   Minutes of Exercise per Session: 20 min  Stress: No Stress Concern Present   Feeling of Stress : Only a little  Social Connections: Moderately Integrated   Frequency of Communication with Friends and Family: More than three times a week   Frequency of Social Gatherings with Friends and Family: More than three times a week   Attends Religious Services: More than 4 times per year   Active Member of Clubs or Organizations: No   Attends Archivist Meetings: Never   Marital Status: Married   Past Surgical History:  Procedure Laterality Date   TOOTH EXTRACTION     TUBAL LIGATION N/A 06/29/2020   Procedure: POST PARTUM TUBAL LIGATION;  Surgeon: Verita Schneiders  A, MD;  Location: [facility];  Service: Gynecology;  Laterality: N/A;   Past Surgical History:  Procedure Laterality Date   TOOTH EXTRACTION     TUBAL LIGATION N/A 06/29/2020   Procedure: POST PARTUM TUBAL LIGATION;  Surgeon: Osborne Oman, MD;  Location: [facility];  Service: Gynecology;  Laterality: N/A;   Past Medical History:  Diagnosis Date   Anxiety    Asthma    Phreesia 09/01/2020   Degenerative cervical disc    Depression    Phreesia 09/01/2020   Dizziness    Edema    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   Hypertension    BP 121/80   Pulse 100   Ht '5\' 5"'$  (1.651 m)   Wt 169 lb 9.6 oz (76.9 kg)   SpO2 96%   BMI 28.22 kg/m    Opioid Risk Score:   Fall Risk Score:  `1  Depression screen [facility] 2/9     02/04/2022   11:50 AM 01/05/2022    4:20 PM 12/17/2021   11:50 AM 12/10/2021   10:15 AM 11/19/2021    3:13 PM 10/07/2021   10:00 AM 10/05/2021    1:38 PM  Depression screen PHQ 2/9  Decreased Interest 0 0 0 0 0 0 0  Down, Depressed, Hopeless 0 0 0 0 0 0 0  PHQ - 2 Score 0 0 0 0 0 0 0  Altered sleeping    0  0   Tired, decreased energy    3  0   Change in appetite    0  0   Feeling bad or failure about yourself     0  0   Trouble concentrating    0  0   Moving slowly or fidgety/restless    0  0   Suicidal thoughts    0  0   PHQ-9 Score    3  0   Difficult doing work/chores      Not difficult at all      Review of Systems  Constitutional: Negative.   HENT: Negative.    Eyes: Negative.   Respiratory: Negative.    Cardiovascular: Negative.   Gastrointestinal: Negative.   Endocrine: Negative.   Genitourinary: Negative.   Musculoskeletal:  Positive for back pain.  Skin: Negative.   Allergic/Immunologic: Negative.   Neurological: Negative.   Hematological: Negative.   Psychiatric/Behavioral: Negative.    All other systems reviewed and are negative.     Objective:   Physical Exam Well-developed well-nourished female no acute distress Mood and affect are appropriate She ambulates without assistive device no evidence of toe drag or knee instability Negative straight leg raising bilaterally Deep tendon reflexes are 3+ bilateral knees and ankles. Sensation normal to pinprick at bilateral L4-5 S1 normal light touch at L2-3 bilaterally Lumbar range of motion has good lumbar flexion without pain lumbar extension is accompanied by pain in the lower lumbar area worse on the left side. Motor strength is 5/5 bilateral hip flexor knee extensor ankle dorsiflexors.       Assessment & Plan:   1.  Chronic low back pain persisting for several years.  She has had no MRI for 3 years.  Her pain has not responded  to piriformis injection or other conservative care.  MRI lumbar spine is required to evaluate for nerve root compression. MRI would also show any significant facet arthropathy that can also be contributing.  We will discuss further treatment options after MRI is obtained  Return to clinic in 1 month

## 2022-02-10 ENCOUNTER — Telehealth: Payer: Self-pay | Admitting: *Deleted

## 2022-02-10 LAB — TOXASSURE SELECT 13 (MW), URINE

## 2022-02-10 NOTE — Telephone Encounter (Signed)
Urine drug screen for this encounter is consistent for prescribed medication 

## 2022-02-11 ENCOUNTER — Ambulatory Visit: Admitting: Nurse Practitioner

## 2022-02-24 ENCOUNTER — Ambulatory Visit (HOSPITAL_COMMUNITY)
Admission: RE | Admit: 2022-02-24 | Discharge: 2022-02-24 | Disposition: A | Discharge status: Home / Self Care | Source: Ambulatory Visit | Attending: Physical Medicine & Rehabilitation | Admitting: Physical Medicine & Rehabilitation

## 2022-02-24 DIAGNOSIS — M5416 Radiculopathy, lumbar region: Secondary | ICD-10-CM | POA: Insufficient documentation

## 2022-02-26 LAB — CMP14+EGFR
ALT: 29 IU/L (ref 0–32)
AST: 22 IU/L (ref 0–40)
Albumin/Globulin Ratio: 1.5 (ref 1.2–2.2)
Albumin: 4.3 g/dL (ref 3.8–4.8)
Alkaline Phosphatase: 91 IU/L (ref 44–121)
BUN/Creatinine Ratio: 13 (ref 9–23)
BUN: 10 mg/dL (ref 6–20)
Bilirubin Total: 0.3 mg/dL (ref 0.0–1.2)
CO2: 22 mmol/L (ref 20–29)
Calcium: 9.2 mg/dL (ref 8.7–10.2)
Chloride: 104 mmol/L (ref 96–106)
Creatinine, Ser: 0.78 mg/dL (ref 0.57–1.00)
Globulin, Total: 2.8 g/dL (ref 1.5–4.5)
Glucose: 95 mg/dL (ref 70–99)
Potassium: 4.5 mmol/L (ref 3.5–5.2)
Sodium: 140 mmol/L (ref 134–144)
Total Protein: 7.1 g/dL (ref 6.0–8.5)
eGFR: 100 mL/min/{1.73_m2} (ref >59)

## 2022-02-26 LAB — HEMOGLOBIN A1C
Est. average glucose Bld gHb Est-mCnc: 117 mg/dL
Hgb A1c MFr Bld: 5.7 % — ABNORMAL HIGH (ref 4.8–5.6)

## 2022-02-26 LAB — LIPID PANEL
Chol/HDL Ratio: 3.5 ratio (ref 0.0–4.4)
Cholesterol, Total: 168 mg/dL (ref 100–199)
HDL: 48 mg/dL (ref >39)
LDL Chol Calc (NIH): 99 mg/dL (ref 0–99)
Triglycerides: 116 mg/dL (ref 0–149)
VLDL Cholesterol Cal: 21 mg/dL (ref 5–40)

## 2022-02-26 LAB — CBC
Hematocrit: 40.1 % (ref 34.0–46.6)
Hemoglobin: 13.4 g/dL (ref 11.1–15.9)
MCH: 27.9 pg (ref 26.6–33.0)
MCHC: 33.4 g/dL (ref 31.5–35.7)
MCV: 83 fL (ref 79–97)
Platelets: 326 10*3/uL (ref 150–450)
RBC: 4.81 x10E6/uL (ref 3.77–5.28)
RDW: 13 % (ref 11.7–15.4)
WBC: 7.5 10*3/uL (ref 3.4–10.8)

## 2022-02-26 LAB — VITAMIN D 25 HYDROXY (VIT D DEFICIENCY, FRACTURES): Vit D, 25-Hydroxy: 30.4 ng/mL (ref 30.0–100.0)

## 2022-02-26 LAB — TSH: TSH: 0.94 u[IU]/mL (ref 0.450–4.500)

## 2022-02-26 NOTE — Progress Notes (Signed)
Will discuss results with pt at her upcoming appointment

## 2022-03-03 ENCOUNTER — Encounter: Payer: Self-pay | Admitting: Nurse Practitioner

## 2022-03-03 ENCOUNTER — Ambulatory Visit (INDEPENDENT_AMBULATORY_CARE_PROVIDER_SITE_OTHER): Admitting: Nurse Practitioner

## 2022-03-03 VITALS — BP 114/75 | HR 76 | Ht 65.0 in | Wt 168.0 lb

## 2022-03-03 DIAGNOSIS — J4530 Mild persistent asthma, uncomplicated: Secondary | ICD-10-CM | POA: Diagnosis not present

## 2022-03-03 DIAGNOSIS — F418 Other specified anxiety disorders: Secondary | ICD-10-CM | POA: Diagnosis not present

## 2022-03-03 DIAGNOSIS — E663 Overweight: Secondary | ICD-10-CM

## 2022-03-03 DIAGNOSIS — G4700 Insomnia, unspecified: Secondary | ICD-10-CM

## 2022-03-03 DIAGNOSIS — I10 Essential (primary) hypertension: Secondary | ICD-10-CM

## 2022-03-03 NOTE — Assessment & Plan Note (Addendum)
Wt Readings from Last 3 Encounters:  03/03/22 168 lb (76.2 kg)  02/04/22 169 lb 9.6 oz (76.9 kg)  01/05/22 168 lb (76.2 kg)  Current BMI 27.96, Wegovy was denied.  Does not want to take phentermine currently on Tylenol with codeine for chronic pain. Patient counseled on low-carb diet, encouraged to engage in regular moderate exercises at least 150 minutes weekly

## 2022-03-03 NOTE — Assessment & Plan Note (Signed)
Chronic condition well-controlled on trazodone 150 mg as needed bedtime Continue current medication

## 2022-03-03 NOTE — Assessment & Plan Note (Signed)
Well controlled, continue zoloft '200mg'$  daily , denies SI, HI

## 2022-03-03 NOTE — Progress Notes (Signed)
   Gabriella Schmidt     MRN: [id number]      DOB: [birth year]   HPI Gabriella Schmidt with past medical history of mild intermittent asthma, overweight, depression with anxiety, insomnia is here for follow up and re-evaluation of chronic medical conditions.   The PT denies any adverse reactions to current medications since the last visit.  There are no new concerns.  There are no specific complaints        ROS Denies recent fever or chills. Denies sinus pressure, nasal congestion, ear pain or sore throat. Denies chest congestion, productive cough or wheezing. Denies chest pains, palpitations and leg swelling Denies abdominal pain, nausea, vomiting,diarrhea or constipation.   Denies dysuria, frequency, hesitancy or incontinence. Denies joint pain, swelling and limitation in mobility. Denies headaches, seizures, numbness, or tingling. Denies depression, anxiety or insomnia.    PE  BP 114/75 (BP Location: Right Arm, Patient Position: Sitting, Cuff Size: Normal)   Pulse 76   Ht '5\' 5"'$  (1.651 m)   Wt 168 lb (76.2 kg)   LMP 03/02/2022 (Exact Date)   SpO2 96%   Breastfeeding No   BMI 27.96 kg/m   Patient alert and oriented and in no cardiopulmonary distress.   Chest: Clear to auscultation bilaterally.  CVS: S1, S2 no murmurs, no S3.Regular rate.  ABD: Soft non tender.   Ext: No edema  MS: Adequate ROM spine, shoulders, hips and knees.   Psych: Good eye contact, normal affect. Memory intact not anxious or depressed appearing.     Assessment & Plan Depression with anxiety Well controlled, continue zoloft '200mg'$  daily , denies SI, HI  Overweight (BMI 25.0-29.9) Wt Readings from Last 3 Encounters:  03/03/22 168 lb (76.2 kg)  02/04/22 169 lb 9.6 oz (76.9 kg)  01/05/22 168 lb (76.2 kg)  Current BMI 27.96, Wegovy was denied.  Does not want to take phentermine currently on Tylenol with codeine for chronic pain. Patient counseled on low-carb diet, encouraged to engage in  regular moderate exercises at least 150 minutes weekly  Insomnia Chronic condition well-controlled on trazodone 150 mg as needed bedtime Continue current medication  Asthma Chronic condition well-controlled on albuterol inhaler 2 puffs every 6 hours as needed Continue current medication  Chronic hypertension BP Readings from Last 3 Encounters:  03/03/22 114/75  02/04/22 121/80  01/05/22 121/81  Doing well without medication DASH diet advised, engage in moderate exercises at least 150 minutes weekly. Follow-up in 6 months

## 2022-03-03 NOTE — Assessment & Plan Note (Signed)
BP Readings from Last 3 Encounters:  03/03/22 114/75  02/04/22 121/80  01/05/22 121/81  Doing well without medication DASH diet advised, engage in moderate exercises at least 150 minutes weekly. Follow-up in 6 months

## 2022-03-03 NOTE — Patient Instructions (Signed)

## 2022-03-03 NOTE — Assessment & Plan Note (Signed)
Chronic condition well-controlled on albuterol inhaler 2 puffs every 6 hours as needed Continue current medication

## 2022-03-17 ENCOUNTER — Encounter: Payer: Self-pay | Admitting: Adult Health

## 2022-03-17 ENCOUNTER — Ambulatory Visit (INDEPENDENT_AMBULATORY_CARE_PROVIDER_SITE_OTHER): Admitting: Adult Health

## 2022-03-17 VITALS — BP 114/75 | HR 80 | Ht 65.0 in | Wt 166.5 lb

## 2022-03-17 DIAGNOSIS — N941 Unspecified dyspareunia: Secondary | ICD-10-CM

## 2022-03-17 DIAGNOSIS — R319 Hematuria, unspecified: Secondary | ICD-10-CM | POA: Diagnosis not present

## 2022-03-17 DIAGNOSIS — R32 Unspecified urinary incontinence: Secondary | ICD-10-CM | POA: Insufficient documentation

## 2022-03-17 LAB — POCT URINALYSIS DIPSTICK
Glucose, UA: NEGATIVE
Ketones, UA: NEGATIVE
Nitrite, UA: NEGATIVE
Protein, UA: NEGATIVE

## 2022-03-17 MED ORDER — PHENAZOPYRIDINE HCL 200 MG PO TABS
200.0000 mg | ORAL_TABLET | Freq: Three times a day (TID) | ORAL | 0 refills | Status: DC | PRN
Start: 2022-03-17 — End: 2022-03-31

## 2022-03-17 MED ORDER — SULFAMETHOXAZOLE-TRIMETHOPRIM 800-160 MG PO TABS
1.0000 | ORAL_TABLET | Freq: Two times a day (BID) | ORAL | 0 refills | Status: DC
Start: 2022-03-17 — End: 2022-03-31

## 2022-03-17 NOTE — Progress Notes (Signed)
  Subjective:     Patient ID: Gabriella Schmidt, female   DOB: [birth year], 41 y.o.   MRN: [id number]  HPI Gabriella Schmidt is a 41 year old white female, married, [id number], complaining of urinary incontinence any time and pain with sex, feels like he is hitting something and feels bruised. Did not get Freida Busman approved she says.  Lab Results  Component Value Date   DIAGPAP  02/21/2020    - Negative for intraepithelial lesion or malignancy (NILM)   [place] Negative 02/21/2020   PCP is F [facility].  Review of Systems +urinary incontinence Pain with sex Reviewed past medical,surgical, social and family history. Reviewed medications and allergies.     Objective:   Physical Exam BP 114/75 (BP Location: Left Arm, Patient Position: Sitting, Cuff Size: Normal)   Pulse 80   Ht '5\' 5"'$  (1.651 m)   Wt 166 lb 8 oz (75.5 kg)   LMP 02/25/2022 (Exact Date)   Breastfeeding No   BMI 27.71 kg/m     Urine dipstick, +Blood and leuks Skin warm and dry.Pelvic: external genitalia is normal in appearance no lesions, vagina is pink,urethra has no lesions or masses noted, cervix: bulbous,has some cervical eversion, and tender when touched, uterus: normal size, shape and contour, non tender, no masses felt, adnexa: no masses or tenderness noted. Bladder is tender and no masses felt.  [facility] - 03/17/22 1201       Pregnancy Intention Screening   Does the patient want to become pregnant in the next year? No    Does the patient's partner want to become pregnant in the next year? No    Would the patient like to discuss contraceptive options today? No      Contraception Wrap Up   Current Method Female Sterilization    End Method Female Sterilization            Examination chaperoned by Levy Pupa LPN  Assessment:     1. Urinary incontinence, unspecified type - POCT Urinalysis Dipstick - Urine Culture  2. Dyspareunia, female Discussed trying different positions  No sex white taking septra ds and  pyridium May try myfembree on return since unable to get orlissa   3. Hematuria, unspecified type Will send urine culture ?UTI - Urine Culture    Will rx septra ds and pyridium Meds ordered this encounter  Medications   sulfamethoxazole-trimethoprim (BACTRIM DS) 800-160 MG tablet    Sig: Take 1 tablet by mouth 2 (two) times daily. Take 1 bid    Dispense:  14 tablet    Refill:  0    Order Specific Question:   Supervising Provider    Answer:   Tania Ade H [[id number]]   phenazopyridine (PYRIDIUM) 200 MG tablet    Sig: Take 1 tablet (200 mg total) by mouth 3 (three) times daily as needed for pain.    Dispense:  10 tablet    Refill:  0    Order Specific Question:   Supervising Provider    Answer:   Florian Buff [[id number]]    Plan:     Has appt 04/01/22 already just keep that in follow up

## 2022-03-18 ENCOUNTER — Encounter: Attending: Physical Medicine & Rehabilitation | Admitting: Physical Medicine & Rehabilitation

## 2022-03-18 ENCOUNTER — Encounter: Payer: Self-pay | Admitting: Physical Medicine & Rehabilitation

## 2022-03-18 VITALS — BP 113/75 | HR 84 | Ht 65.0 in | Wt 166.0 lb

## 2022-03-18 DIAGNOSIS — M533 Sacrococcygeal disorders, not elsewhere classified: Secondary | ICD-10-CM

## 2022-03-18 NOTE — Progress Notes (Signed)
Subjective:    Patient ID: Gabriella Schmidt, female    DOB: 1980/10/26, 41 y.o.   MRN: [id number]  HPI 41 year old female with chronic low back sacral and coccygeal pain who returns today to discuss the results of her recent MRI of the lumbar spine.  The patient has had prior work-up and was treated in [place] for the same problem.  She underwent sacroiliac injections as well as epidural injections.  She does not recall the details on these injections and no records are available.  She does have an MRI of the lumbar spine in 2019 and the report is scanned into the system but actual films are not available. Since treatment at this clinic she has undergone bilateral sacroiliac injections which we have been helpful in alleviating some upper buttocks pain.  The coccygeal pain has remained unchanged.  Because of buttocks pain radiating into the left thigh as well as tenderness over the piriformis and piriformis injection was performed under ultrasound guidance.  Unfortunately this was not helpful in alleviating her pain symptoms. The patient is taking Tylenol No. 4 2 to 3 tablets/day which is partially helpful MRI LUMBAR SPINE WITHOUT CONTRAST   TECHNIQUE: Multiplanar, multisequence MR imaging of the lumbar spine was performed. No intravenous contrast was administered.   COMPARISON:  Radiograph dated April 23, 2021   FINDINGS: Segmentation:   5 non rib-bearing lumbar type vertebral bodies are present. The lowest fully formed vertebral body is L5.   Alignment:  Mild retrolisthesis at L5-S1.   Vertebrae:  No fracture, evidence of discitis, or bone lesion.   Conus medullaris and cauda equina: Conus extends to the L1 level. Conus and cauda equina appear normal.   Paraspinal and other soft tissues: Negative.   Disc levels:   T12-L1: No significant disc bulge. No neural foraminal stenosis. No central canal stenosis.   L1-L2: No significant disc bulge. No neural foraminal stenosis. No central  canal stenosis.   L2-L3: No significant disc bulge. No neural foraminal stenosis. No central canal stenosis. Mild facet joint arthropathy.   L3-L4: No significant disc bulge. No neural foraminal stenosis. No central canal stenosis. Mild facet joint arthropathy.   L4-L5: Mild disc bulge without significant spinal canal or neural foraminal stenosis. Mild facet joint arthropathy.   L5-S1: Disc desiccation and height loss. Mild disc bulge without significant spinal canal or neural foraminal stenosis. Mild facet joint arthropathy.   IMPRESSION: 1.  No evidence of fracture.   2.  Visualized cord and cauda equina are within normal limits.   3. 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 No significant spinal canal or neural foraminal stenosis. Mild multilevel facet joint arthropathy.     Electronically Signed   By: Keane Police D.O.   On: 02/25/2022 10:13 Pain Inventory Average Pain 7 Pain Right Now 9 My pain is intermittent, constant, sharp, stabbing, and aching  In the last 24 hours, has pain interfered with the following? General activity 5 Relation with others 2 Enjoyment of life 4 What TIME of day is your pain at its worst? morning , daytime, evening, and night Sleep (in general) Fair  Pain is worse with: walking, bending, sitting, inactivity, standing, and some activites Pain improves with: rest, heat/ice, and medication Relief from Meds: 5     Family History  Problem Relation Age of Onset   Hypertension Mother    Hypertension Father    Cardiomyopathy Father    Hypertension Maternal Grandmother  Hypertension Maternal Grandfather    Social History   Socioeconomic History   Marital status: Married    Spouse name: [name]   Number of children: Not on file   Years of education: Not on file   Highest education level: Not on file  Occupational History   Not on file  Tobacco Use   Smoking status: Former    Smokeless tobacco: Never  Vaping Use   Vaping Use: Former  Substance and Sexual Activity   Alcohol use: Not Currently   Drug use: Never   Sexual activity: Yes    Birth control/protection: Surgical    Comment: tubal  Other Topics Concern   Not on file  Social History Narrative   Not on file   Social Determinants of Health   Financial Resource Strain: Low Risk  (12/10/2021)   Overall Financial Resource Strain (CARDIA)    Difficulty of Paying Living Expenses: Not hard at all  Food Insecurity: No Food Insecurity (12/10/2021)   Hunger Vital Sign    Worried About Running Out of Food in the Last Year: Never true    [place] in the Last Year: Never true  Transportation Needs: No Transportation Needs (12/10/2021)   PRAPARE - Hydrologist (Medical): No    Lack of Transportation (Non-Medical): No  Physical Activity: Insufficiently Active (12/10/2021)   Exercise Vital Sign    Days of Exercise per Week: 2 days    Minutes of Exercise per Session: 20 min  Stress: No Stress Concern Present (12/10/2021)   [place]    Feeling of Stress : Only a little  Social Connections: Moderately Integrated (12/10/2021)   Social Connection and Isolation Panel [NHANES]    Frequency of Communication with Friends and Family: More than three times a week    Frequency of Social Gatherings with Friends and Family: More than three times a week    Attends Religious Services: More than 4 times per year    Active Member of Clubs or Organizations: No    Attends Archivist Meetings: Never    Marital Status: Married   Past Surgical History:  Procedure Laterality Date   TOOTH EXTRACTION     TUBAL LIGATION N/A 06/29/2020   Procedure: POST PARTUM TUBAL LIGATION;  Surgeon: Osborne Oman, MD;  Location: [facility];  Service: Gynecology;  Laterality: N/A;   Past Medical History:  Diagnosis Date   Anxiety     Asthma    Phreesia 09/01/2020   Degenerative cervical disc    Depression    Phreesia 09/01/2020   Dizziness    Edema    Elevated hemoglobin A1c    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   Hypertension    LMP 02/25/2022 (Exact Date)   Opioid Risk Score:   Fall Risk Score:  `1  Depression screen PHQ 2/9     03/03/2022   10:30 AM 02/04/2022   11:50 AM 01/05/2022    4:20 PM 12/17/2021   11:50 AM 12/10/2021   10:15 AM 11/19/2021    3:13 PM 10/07/2021   10:00 AM  Depression screen PHQ 2/9  Decreased Interest 0 0 0 0 0 0 0  Down, Depressed, Hopeless 0 0 0 0 0 0 0  PHQ - 2 Score 0 0 0 0 0 0 0  Altered sleeping     0  0  Tired, decreased energy     3  0  Change in appetite     0  0  Feeling bad or failure about yourself      0  0  Trouble concentrating     0  0  Moving slowly or fidgety/restless     0  0  Suicidal thoughts     0  0  PHQ-9 Score     3  0  Difficult doing work/chores       Not difficult at all    Review of Systems  Musculoskeletal:        Pain lower back and both hips  All other systems reviewed and are negative.      Objective:   Physical Exam Vitals and nursing note reviewed.  Constitutional:      Appearance: She is normal weight.  HENT:     Head: Normocephalic and atraumatic.  Eyes:     Extraocular Movements: Extraocular movements intact.     Conjunctiva/sclera: Conjunctivae normal.     Pupils: Pupils are equal, round, and reactive to light.  Musculoskeletal:     Cervical back: Normal range of motion.     Comments: There is mild tenderness palpation over the PSIS bilaterally as well as the lower mid sacral area also in the lumbar paraspinal area bilaterally at the lumbosacral junction. Negative straight leg raising bilaterally No pain with hip range of motion bilaterally.  Skin:    General: Skin is warm and dry.  Neurological:     General: No focal deficit present.     Mental Status: She is alert and oriented to person, place, and time.      Comments: Ambulates without assistive device no evidence of drag or knee instability. Normal sensation in the lower extremities.  Normal tone  Psychiatric:        Mood and Affect: Mood normal.        Behavior: Behavior normal.     Pain with lumbar extension greater than with flexion      Assessment & Plan:   #1.  Chronic low back pain we reviewed viewed imaging study, no significant changes when compared to the report from the lumbar MRI performed in [place] in 2019.  Mild disc bulge and desiccation at L5-S1 but no foraminal or canal stenosis.  Her pain is mainly with standing relieved by sitting therefore suspect lower lumbar facets as pain generator.  We will do diagnostic injections with lidocaine further evaluate.  We will do bilateral L3-4-5 2.  Chronic sacroiliac pain this is actually doing quite well after the injections no need for reinjection at this time 3.  Lower sacral/coccygeal pain does have a T1 intensity cystic area in the lower sacral area would like to have orthospine review this. Continue current medications

## 2022-03-19 LAB — URINE CULTURE

## 2022-03-24 ENCOUNTER — Ambulatory Visit (INDEPENDENT_AMBULATORY_CARE_PROVIDER_SITE_OTHER): Admitting: Specialist

## 2022-03-24 ENCOUNTER — Ambulatory Visit: Payer: Self-pay

## 2022-03-24 ENCOUNTER — Encounter: Payer: Self-pay | Admitting: Specialist

## 2022-03-24 VITALS — BP 120/83 | HR 87 | Ht 65.0 in | Wt 166.0 lb

## 2022-03-24 DIAGNOSIS — M5136 Other intervertebral disc degeneration, lumbar region: Secondary | ICD-10-CM | POA: Diagnosis not present

## 2022-03-24 DIAGNOSIS — M533 Sacrococcygeal disorders, not elsewhere classified: Secondary | ICD-10-CM | POA: Diagnosis not present

## 2022-03-24 DIAGNOSIS — S338XXA Sprain of other parts of lumbar spine and pelvis, initial encounter: Secondary | ICD-10-CM | POA: Diagnosis not present

## 2022-03-24 DIAGNOSIS — M47816 Spondylosis without myelopathy or radiculopathy, lumbar region: Secondary | ICD-10-CM

## 2022-03-24 MED ORDER — DICLOFENAC SODIUM 50 MG PO TBEC: 50.0000 mg | DELAYED_RELEASE_TABLET | Freq: Two times a day (BID) | ORAL | 3 refills | Status: DC
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
## 2022-03-24 NOTE — Patient Instructions (Addendum)
Avoid bending, stooping and avoid lifting weights greater than 10-15 lbs. Avoid prolong standing and walking. Avoid frequent bending and stooping  No lifting greater than 10 lbs. May use ice or moist heat for pain. Weight loss is of benefit. Diclofenac 50 mg  take once a day for 3-4 days then may go to BID with meal or snack. Sacro-eaze car seat.

## 2022-03-26 ENCOUNTER — Other Ambulatory Visit: Payer: Self-pay | Admitting: Nurse Practitioner

## 2022-03-26 DIAGNOSIS — F418 Other specified anxiety disorders: Secondary | ICD-10-CM

## 2022-03-26 LAB — RHEUMATOID FACTOR: Rheumatoid fact SerPl-aCnc: <14 IU/mL (ref <14)

## 2022-03-26 LAB — ANTI-NUCLEAR AB-TITER (ANA TITER)
ANA TITER: 1:40 {titer} — ABNORMAL HIGH
ANA Titer 1: 1:40 {titer} — ABNORMAL HIGH

## 2022-03-26 LAB — SEDIMENTATION RATE: Sed Rate: 22 mm/h — ABNORMAL HIGH (ref 0–20)

## 2022-03-26 LAB — ANA: Anti Nuclear Antibody (ANA): POSITIVE — AB

## 2022-03-26 LAB — URIC ACID: Uric Acid, Serum: 3.6 mg/dL (ref 2.5–7.0)

## 2022-03-26 LAB — HIGH SENSITIVITY CRP: hs-CRP: >10 mg/L — ABNORMAL HIGH

## 2022-03-31 ENCOUNTER — Encounter: Payer: Self-pay | Admitting: Adult Health

## 2022-03-31 ENCOUNTER — Ambulatory Visit (INDEPENDENT_AMBULATORY_CARE_PROVIDER_SITE_OTHER): Admitting: Adult Health

## 2022-03-31 VITALS — BP 117/73 | HR 80 | Ht 65.0 in | Wt 168.0 lb

## 2022-03-31 DIAGNOSIS — N941 Unspecified dyspareunia: Secondary | ICD-10-CM

## 2022-03-31 DIAGNOSIS — N9489 Other specified conditions associated with female genital organs and menstrual cycle: Secondary | ICD-10-CM | POA: Insufficient documentation

## 2022-03-31 DIAGNOSIS — N920 Excessive and frequent menstruation with regular cycle: Secondary | ICD-10-CM | POA: Diagnosis not present

## 2022-03-31 MED ORDER — MYFEMBREE 40-1-0.5 MG PO TABS: 1.0000 | ORAL_TABLET | Freq: Every day | ORAL | 6 refills | Status: DC

## 2022-03-31 NOTE — Progress Notes (Signed)
  Subjective:     Patient ID: Gabriella Schmidt, female   DOB: [birth year], 41 y.o.   MRN: [id number]  HPI Gabriella Schmidt is a 41 year old white female,married, [id number], back in follow up and is feeling better after being treated for UTI, she still has heavy periods and cramping and pain with sex at times. She used smaples of [place] in the past and felt better but insurance would not approve. PCP is Dr Moshe Cipro.  Lab Results  Component Value Date   DIAGPAP  02/21/2020    - Negative for intraepithelial lesion or malignancy (NILM)   HPVHIGH Negative 02/21/2020    Review of Systems +heavy periods +cramping +pain with sex at times Reviewed past medical,surgical, social and family history. Reviewed medications and allergies.     Objective:   Physical Exam BP 117/73 (BP Location: Left Arm, Patient Position: Sitting, Cuff Size: Normal)   Pulse 80   Ht '5\' 5"'$  (1.651 m)   Wt 168 lb (76.2 kg)   LMP 03/24/2022 (Exact Date)   Breastfeeding No   BMI 27.96 kg/m     Skin warm and dry. Lungs: clear to ausculation bilaterally. Cardiovascular: regular rate and rhythm.   Upstream - 03/31/22 1101       Pregnancy Intention Screening   Does the patient want to become pregnant in the next year? No    Does the patient's partner want to become pregnant in the next year? No    Would the patient like to discuss contraceptive options today? No      Contraception Wrap Up   Current Method Female Sterilization    End Method Female Sterilization             Assessment:     1. Uterine cramping I suspect she has has endometriosis, will try myfembree, 7 tablets given and handout  Will send in Rx Meds ordered this encounter  Medications   Relugolix-Estradiol-Norethind (MYFEMBREE) 40-1-0.5 MG TABS    Sig: Take 1 tablet by mouth daily.    Dispense:  30 tablet    Refill:  6    Order Specific Question:   Supervising Provider    Answer:   Elonda Husky, LUTHER H [[id number]]    2. Menorrhagia with regular cycle Trying  myfembree  3. Dyspareunia, female     Plan:     Follow up TBD

## 2022-04-01 ENCOUNTER — Ambulatory Visit: Admitting: Adult Health

## 2022-04-07 ENCOUNTER — Ambulatory Visit
Admission: RE | Admit: 2022-04-07 | Discharge: 2022-04-07 | Disposition: A | Discharge status: Home / Self Care | Source: Ambulatory Visit | Attending: Specialist | Admitting: Specialist

## 2022-04-07 DIAGNOSIS — M47816 Spondylosis without myelopathy or radiculopathy, lumbar region: Secondary | ICD-10-CM

## 2022-04-07 DIAGNOSIS — M533 Sacrococcygeal disorders, not elsewhere classified: Secondary | ICD-10-CM

## 2022-04-08 ENCOUNTER — Ambulatory Visit: Admitting: Physical Medicine & Rehabilitation

## 2022-04-22 ENCOUNTER — Ambulatory Visit (INDEPENDENT_AMBULATORY_CARE_PROVIDER_SITE_OTHER): Admitting: Specialist

## 2022-04-22 ENCOUNTER — Encounter: Payer: Self-pay | Admitting: Specialist

## 2022-04-22 VITALS — BP 104/70 | HR 80 | Ht 65.0 in | Wt 168.0 lb

## 2022-04-22 DIAGNOSIS — M461 Sacroiliitis, not elsewhere classified: Secondary | ICD-10-CM

## 2022-04-22 DIAGNOSIS — M5136 Other intervertebral disc degeneration, lumbar region: Secondary | ICD-10-CM

## 2022-04-22 DIAGNOSIS — M47817 Spondylosis without myelopathy or radiculopathy, lumbosacral region: Secondary | ICD-10-CM | POA: Diagnosis not present

## 2022-04-22 MED ORDER — MISOPROSTOL 100 MCG PO TABS: 100.0000 ug | ORAL_TABLET | Freq: Two times a day (BID) | ORAL | 2 refills | Status: DC | PRN

## 2022-04-22 NOTE — Progress Notes (Signed)
Office Visit Note   Patient: Gabriella Schmidt           Date of Birth: 10/15/1980           MRN: [id number] Visit Date: 04/22/2022              Requested by: Renee Rival, [address] [place],  [place] [address] PCP: Renee Rival, FNP   Assessment & Plan: Visit Diagnoses:  1. Sacroiliac inflammation ([place])   2. Lumbosacral facet joint syndrome   3. Other intervertebral disc degeneration, lumbar region     Plan: Avoid frequent bending and stooping  No lifting greater than 10 lbs. May use ice or moist heat for pain. Weight loss is of benefit. Best medication for lumbar disc disease is arthritis medications like motrin, celebrex and naprosyn. Exercise is important to improve your indurance and does allow people to function better inspite of back pain. Rheumatology Consult for positive ANA, low titer with sacroileitis and elevated hCRP facet effusions. Referral to Dr. Ernestina Patches for left lower SI joint block and bilateral L5-S1 facet blocks for effusions and reactive  Changes suggesting an inflamatory arthrosis of these joints.   Follow-Up Instructions: No follow-ups on file.   Orders:  No orders of the defined types were placed in this encounter.  No orders of the defined types were placed in this encounter.     Procedures: No procedures performed   Clinical Data: No additional findings.   Subjective: Chief Complaint  Patient presents with   Spine - Follow-up    HPI  Review of Systems   Objective: Vital Signs: BP 104/70   Pulse 80   Ht '5\' 5"'$  (1.651 m)   Wt 168 lb (76.2 kg)   LMP 03/24/2022 (Exact Date)   BMI 27.96 kg/m   Physical Exam  Ortho Exam  Specialty Comments:  No specialty comments available.  Imaging: No results found.   PMFS History: Patient Active Problem List   Diagnosis Date Noted   Menorrhagia with regular cycle 03/31/2022   Uterine cramping 03/31/2022   Dyspareunia, female 03/17/2022    Urinary incontinence 03/17/2022   Hematuria 03/17/2022   Overweight (BMI 25.0-29.9) 01/05/2022   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   Encounter for annual routine gynecological examination 12/10/2021   Weight gain 11/23/2021   Fatigue 11/23/2021   Nausea 11/23/2021   Breast tenderness 11/23/2021   Mass of upper outer quadrant of left breast 11/23/2021   Mass of lower inner quadrant of right breast 11/23/2021   Proteinuria 11/23/2021   Urinary frequency 11/23/2021   Pregnancy test negative 11/23/2021   Anemia 09/02/2021   COVID-19 04/17/2021   Insomnia 01/14/2021   Back pain 01/14/2021   Liver function test abnormality 10/06/2020   Hyperlipidemia 10/06/2020   Dizziness 10/06/2020   Degenerative disc disease at L5-S1 level 10/06/2020   Asthma 09/01/2020   Chronic hypertension 01/31/2020   Depression with anxiety 01/31/2020   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   Hypertension     Family History  Problem Relation Age of Onset   Hypertension Mother    Hypertension Father    Cardiomyopathy Father    Hypertension Maternal Grandmother    Hypertension  Maternal Grandfather     Past Surgical History:  Procedure Laterality Date   TOOTH EXTRACTION     TUBAL LIGATION N/A 06/29/2020   Procedure: POST PARTUM TUBAL LIGATION;  Surgeon: Osborne Oman, MD;  Location: [facility] LD [facility];  Service: Gynecology;  Laterality: N/A;   Social History   Occupational History   Not on file  Tobacco Use   Smoking status: Former   Smokeless tobacco: Never  Scientific laboratory technician Use: Former  Substance and Sexual Activity   Alcohol use: Not Currently   Drug use: Never   Sexual activity: Yes    Birth control/protection: Surgical    Comment: tubal

## 2022-04-22 NOTE — Patient Instructions (Signed)
Plan: Avoid frequent bending and stooping  No lifting greater than 10 lbs. May use ice or moist heat for pain. Weight loss is of benefit. Best medication for lumbar disc disease is arthritis medications like motrin, celebrex and naprosyn. Exercise is important to improve your indurance and does allow people to function better inspite of back pain. Rheumatology Consult for positive ANA, low titer with sacroileitis and elevated hCRP facet effusions. Referral to Dr. Ernestina Patches for left lower SI joint block and bilateral L5-S1 facet blocks for effusions and reactive  Changes suggesting an inflamatory arthrosis of these joints.

## 2022-05-06 ENCOUNTER — Telehealth: Payer: Self-pay

## 2022-05-06 DIAGNOSIS — F411 Generalized anxiety disorder: Secondary | ICD-10-CM

## 2022-05-06 NOTE — Telephone Encounter (Signed)
Pt would like Rx for her two appt 9/5 and 9/12 @ [facility] [phone] - , [place]

## 2022-05-13 ENCOUNTER — Encounter: Admitting: Physical Medicine & Rehabilitation

## 2022-05-16 ENCOUNTER — Other Ambulatory Visit: Payer: Self-pay | Admitting: Nurse Practitioner

## 2022-05-16 DIAGNOSIS — G4700 Insomnia, unspecified: Secondary | ICD-10-CM

## 2022-05-17 MED ORDER — DIAZEPAM 5 MG PO TABS: ORAL_TABLET | ORAL | 0 refills | Status: DC

## 2022-05-17 NOTE — Addendum Note (Signed)
Addended by: Raymondo Band on: 05/17/2022 08:18 AM   Modules accepted: Orders

## 2022-05-17 NOTE — Telephone Encounter (Signed)
Pre-procedure diazepam ordered for pre-operative anxiety.  

## 2022-05-20 ENCOUNTER — Encounter: Payer: Self-pay | Admitting: Specialist

## 2022-05-20 ENCOUNTER — Other Ambulatory Visit: Payer: Self-pay

## 2022-05-20 ENCOUNTER — Ambulatory Visit (INDEPENDENT_AMBULATORY_CARE_PROVIDER_SITE_OTHER): Admitting: Specialist

## 2022-05-20 VITALS — BP 110/61 | HR 69 | Ht 65.0 in | Wt 168.0 lb

## 2022-05-20 DIAGNOSIS — M461 Sacroiliitis, not elsewhere classified: Secondary | ICD-10-CM

## 2022-05-20 DIAGNOSIS — M47817 Spondylosis without myelopathy or radiculopathy, lumbosacral region: Secondary | ICD-10-CM | POA: Diagnosis not present

## 2022-05-20 DIAGNOSIS — G4700 Insomnia, unspecified: Secondary | ICD-10-CM

## 2022-05-20 DIAGNOSIS — S338XXA Sprain of other parts of lumbar spine and pelvis, initial encounter: Secondary | ICD-10-CM | POA: Diagnosis not present

## 2022-05-20 MED ORDER — TRAZODONE HCL 150 MG PO TABS: ORAL_TABLET | ORAL | 0 refills | Status: DC

## 2022-05-20 NOTE — Patient Instructions (Signed)
Plan: Avoid frequent bending and stooping  No lifting greater than 10 lbs. May use ice or moist heat for pain. Weight loss is of benefit. Best medication for lumbar disc disease is arthritis medications like motrin, celebrex and naprosyn. Exercise is important to improve your indurance and does allow people to function better inspite of back pain. Rheumatology Consult for positive ANA, low titer with sacroileitis and elevated hCRP facet effusions. Referral to Dr. Ernestina Patches for left lower SI joint block and bilateral L5-S1 facet blocks for effusions and reactive  Changes suggesting an inflamatory arthrosis of these joints.

## 2022-05-20 NOTE — Progress Notes (Signed)
Office Visit Note   Patient: Gabriella Schmidt           Date of Birth: 1981/03/09           MRN: [id number] Visit Date: 05/20/2022              Requested by: Renee Rival, [address],  [place] [address] PCP: Renee Rival, FNP   Assessment & Plan: Visit Diagnoses:  1. Sacroiliac inflammation ([place])   2. Lumbosacral facet joint syndrome   3. Coccyx sprain, initial encounter     Plan: Plan: Avoid frequent bending and stooping  No lifting greater than 10 lbs. May use ice or moist heat for pain. Weight loss is of benefit. Best medication for lumbar disc disease is arthritis medications like motrin, celebrex and naprosyn. Exercise is important to improve your indurance and does allow people to function better inspite of back pain. Rheumatology Consult for positive ANA, low titer with sacroileitis and elevated hCRP facet effusions. Referral to Dr. Ernestina Patches for left lower SI joint block and bilateral L5-S1 facet blocks for effusions and reactive  Changes suggesting an inflamatory arthrosis of these joints.   Follow-Up Instructions: Return in about 4 weeks (around 06/17/2022).   Orders:  No orders of the defined types were placed in this encounter.  No orders of the defined types were placed in this encounter.     Procedures: No procedures performed   Clinical Data: No additional findings.   Subjective: Chief Complaint  Patient presents with   Lower Back - Follow-up    41 year old female with history of  low back and bilateral SI joint pain. Has elevated ANA and is to see a Rheumatologist in November 2023 and Dr. Ernestina Patches is scheduled to perform L5-S1 facet injections and bilateral SI injections.     Review of Systems   Objective: Vital Signs: BP 110/61   Pulse 69   Ht '5\' 5"'$  (1.651 m)   Wt 168 lb (76.2 kg)   BMI 27.96 kg/m   Physical Exam  Ortho Exam  Specialty Comments:  No specialty comments  available.  Imaging: No results found.   PMFS History: Patient Active Problem List   Diagnosis Date Noted   Menorrhagia with regular cycle 03/31/2022   Uterine cramping 03/31/2022   Dyspareunia, female 03/17/2022   Urinary incontinence 03/17/2022   Hematuria 03/17/2022   Overweight (BMI 25.0-29.9) 01/05/2022   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   Encounter for annual routine gynecological examination 12/10/2021   Weight gain 11/23/2021   Fatigue 11/23/2021   Nausea 11/23/2021   Breast tenderness 11/23/2021   Mass of upper outer quadrant of left breast 11/23/2021   Mass of lower inner quadrant of right breast 11/23/2021   Proteinuria 11/23/2021   Urinary frequency 11/23/2021   Pregnancy test negative 11/23/2021   Anemia 09/02/2021   COVID-19 04/17/2021   Insomnia 01/14/2021   Back pain 01/14/2021   Liver function test abnormality 10/06/2020   Hyperlipidemia 10/06/2020   Dizziness 10/06/2020   Degenerative disc disease at L5-S1 level 10/06/2020   Asthma 09/01/2020   Chronic hypertension 01/31/2020   Depression with anxiety 01/31/2020   Past Medical History:  Diagnosis Date   Anxiety    Asthma    Phreesia 09/01/2020   Degenerative cervical disc    Depression    Phreesia 09/01/2020   Dizziness    Edema    Elevated hemoglobin A1c    Fibroadenoma of left breast  12/23/2021   Hypertension     Family History  Problem Relation Age of Onset   Hypertension Mother    Hypertension Father    Cardiomyopathy Father    Hypertension Maternal Grandmother    Hypertension Maternal Grandfather     Past Surgical History:  Procedure Laterality Date   TOOTH EXTRACTION     TUBAL LIGATION N/A 06/29/2020   Procedure: POST PARTUM TUBAL LIGATION;  Surgeon: Osborne Oman, MD;  Location: [facility] LD [facility];  Service: Gynecology;  Laterality: N/A;   Social History   Occupational History   Not on file  Tobacco Use   Smoking status: Former   Smokeless tobacco: Never  Brewing technologist Use: Former  Substance and Sexual Activity   Alcohol use: Not Currently   Drug use: Never   Sexual activity: Yes    Birth control/protection: Surgical    Comment: tubal

## 2022-05-25 ENCOUNTER — Encounter: Payer: Self-pay | Admitting: Physical Medicine and Rehabilitation

## 2022-05-25 ENCOUNTER — Ambulatory Visit: Admitting: Nurse Practitioner

## 2022-05-25 ENCOUNTER — Ambulatory Visit: Payer: Self-pay

## 2022-05-25 ENCOUNTER — Ambulatory Visit (INDEPENDENT_AMBULATORY_CARE_PROVIDER_SITE_OTHER): Admitting: Physical Medicine and Rehabilitation

## 2022-05-25 VITALS — BP 108/70 | HR 106

## 2022-05-25 DIAGNOSIS — M461 Sacroiliitis, not elsewhere classified: Secondary | ICD-10-CM | POA: Diagnosis not present

## 2022-05-25 NOTE — Progress Notes (Unsigned)
Gabriella Schmidt - 41 y.o. female MRN [id number]  Date of birth: June 16, 1981  Office Visit Note: Visit Date: 05/25/2022 PCP: Gabriella Rival, FNP Referred by: Gabriella Rival, FNP  Subjective: No chief complaint on file.  HPI: Gabriella Schmidt is a 41 y.o. female who comes in todayHPI    ROS Otherwise per HPI.  Assessment & Plan: Visit Diagnoses:    ICD-10-CM   1. Sacroiliitis ([place])  M46.1 XR C-ARM NO REPORT       Plan: No additional findings.   Meds & Orders: No orders of the defined types were placed in this encounter.   Orders Placed This Encounter  Procedures   XR C-ARM NO REPORT    Follow-up: No follow-ups on file.   Procedures: No procedures performed      Clinical History: No specialty comments available.   She reports that she has quit smoking. She has never used smokeless tobacco.  Recent Labs    02/25/22 1048 03/24/22 1119  HGBA1C 5.7*  --   LABURIC  --  3.6    Objective:  VS:  HT:    WT:   BMI:     BP:   HR: bpm  TEMP: ( )  RESP:  Physical Exam  Ortho Exam  Imaging: No results found.  Past Medical/Family/Surgical/Social History: Medications & Allergies reviewed per EMR, new medications updated. Patient Active Problem List   Diagnosis Date Noted   Menorrhagia with regular cycle 03/31/2022   Uterine cramping 03/31/2022   Dyspareunia, female 03/17/2022   Urinary incontinence 03/17/2022   Hematuria 03/17/2022   Overweight (BMI 25.0-29.9) 01/05/2022   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   Encounter for annual routine gynecological examination 12/10/2021   Weight gain 11/23/2021   Fatigue 11/23/2021   Nausea 11/23/2021   Breast tenderness 11/23/2021   Mass of upper outer quadrant of left breast 11/23/2021   Mass of lower inner quadrant of right breast 11/23/2021   Proteinuria 11/23/2021   Urinary frequency 11/23/2021   Pregnancy test negative 11/23/2021   Anemia 09/02/2021   COVID-19 04/17/2021   Insomnia 01/14/2021    Back pain 01/14/2021   Liver function test abnormality 10/06/2020   Hyperlipidemia 10/06/2020   Dizziness 10/06/2020   Degenerative disc disease at L5-S1 level 10/06/2020   Asthma 09/01/2020   Chronic hypertension 01/31/2020   Depression with anxiety 01/31/2020   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   Hypertension    Family History  Problem Relation Age of Onset   Hypertension Mother    Hypertension Father    Cardiomyopathy Father    Hypertension Maternal Grandmother    Hypertension Maternal Grandfather    Past Surgical History:  Procedure Laterality Date   TOOTH EXTRACTION     TUBAL LIGATION N/A 06/29/2020   Procedure: POST PARTUM TUBAL LIGATION;  Surgeon: Osborne Oman, MD;  Location: [facility];  Service: Gynecology;  Laterality: N/A;   Social History   Occupational History   Not on file  Tobacco Use   Smoking status: Former   Smokeless tobacco: Never  Vaping Use   Vaping Use: Former  Substance and Sexual Activity   Alcohol use: Not Currently   Drug use: Never  Sexual activity: Yes    Birth control/protection: Surgical    Comment: tubal

## 2022-05-25 NOTE — Progress Notes (Unsigned)
Pt state tail bone pain. Pt state sitting and walking makes the pain worse. Pt state she takes pain meds to help ease her pain.  Numeric Pain Rating Scale and Functional Assessment Average Pain 8   In the last MONTH (on 0-10 scale) has pain interfered with the following?  1. General activity like being  able to carry out your everyday physical activities such as walking, climbing stairs, carrying groceries, or moving a chair?  Rating(10)   +Driver, -BT, -Dye Allergies.

## 2022-05-26 ENCOUNTER — Ambulatory Visit (INDEPENDENT_AMBULATORY_CARE_PROVIDER_SITE_OTHER): Admitting: Nurse Practitioner

## 2022-05-26 ENCOUNTER — Encounter: Payer: Self-pay | Admitting: Nurse Practitioner

## 2022-05-26 VITALS — BP 130/84 | HR 79 | Ht 65.0 in | Wt 170.0 lb

## 2022-05-26 DIAGNOSIS — R110 Nausea: Secondary | ICD-10-CM

## 2022-05-26 DIAGNOSIS — Z020 Encounter for examination for admission to educational institution: Secondary | ICD-10-CM | POA: Diagnosis not present

## 2022-05-26 MED ORDER — BUPIVACAINE HCL 0.5 % IJ SOLN
2.0000 mL | INTRAMUSCULAR | Status: AC | PRN
  Administered 2022-05-25: 2 mL via INTRA_ARTICULAR

## 2022-05-26 MED ORDER — ONDANSETRON HCL 4 MG PO TABS: 4.0000 mg | ORAL_TABLET | Freq: Three times a day (TID) | ORAL | 0 refills | Status: DC | PRN

## 2022-05-26 MED ORDER — METHYLPREDNISOLONE ACETATE 80 MG/ML IJ SUSP
80.0000 mg | INTRAMUSCULAR | Status: AC | PRN
  Administered 2022-05-25: 80 mg via INTRA_ARTICULAR

## 2022-05-26 MED ORDER — METHYLPREDNISOLONE ACETATE 40 MG/ML IJ SUSP
40.0000 mg | INTRAMUSCULAR | Status: AC | PRN
  Administered 2022-05-25: 40 mg via INTRA_ARTICULAR

## 2022-05-26 NOTE — Assessment & Plan Note (Addendum)
nausea of unknown origin, denies fever, chills, HA, dizziness  Start zofran '4mg'$  every 8 hours as needed. Eat bland foods,avoid spicy fatty foods, drink ginger ale as needed.

## 2022-05-26 NOTE — Patient Instructions (Signed)
Please take zofran 4 mg every 8 hours as needed for nausea  It is important that you exercise regularly at least 30 minutes 5 times a week.  Think about what you will eat, plan ahead. Choose " clean, green, fresh or frozen" over canned, processed or packaged foods which are more sugary, salty and fatty. 70 to 75% of food eaten should be vegetables and fruit. Three meals at set times with snacks allowed between meals, but they must be fruit or vegetables. Aim to eat over a 12 hour period , example 7 am to 7 pm, and STOP after  your last meal of the day. Drink water,generally about 64 ounces per day, no other drink is as healthy. Fruit juice is best enjoyed in a healthy way, by EATING the fruit.  Thanks for choosing [facility], we consider it a privelige to serve you.

## 2022-05-26 NOTE — Assessment & Plan Note (Addendum)
MMR immunity labs ordered, up to date with TDAP

## 2022-05-26 NOTE — Progress Notes (Signed)
   Gabriella Schmidt     MRN: [id number]      DOB: 1981-06-14   HPI Gabriella Schmidt with past medical history of asthma, depression with anxiety, degenerative disc disease, hyperlipidemia is here for school health examination , needs MMR immunity checked , will be going to [employer] to study History she's up to date with TDAP vaccine, she refused flu vaccine today    Patient complains of intermittent  nausea that started about 2 months ago, states that her nausea started when she started taking diclofenac for her chronic pain.  Patient denies fever, chills, bloody stool, diarrhea, seizures, headaches, dizziness, abdominal pain. She has had tubal ligation      ROS Denies recent fever or chills. Denies sinus pressure, nasal congestion, ear pain or sore throat. Denies chest congestion, productive cough or wheezing. Denies chest pains, palpitations and leg swelling Denies abdominal pain  Denies dysuria, frequency, hesitancy or incontinence. Denies joint pain, swelling and limitation in mobility. Denies headaches, seizures, numbness, or tingling. Denies depression, anxiety or insomnia.    PE  BP 130/84 (BP Location: Left Arm, Patient Position: Sitting, Cuff Size: Normal)   Pulse 79   Ht $R'5\' 5"'Me$  (1.651 m)   Wt 170 lb (77.1 kg)   LMP 05/07/2022 (Exact Date)   SpO2 95%   Breastfeeding No   BMI 28.29 kg/m   Patient alert and oriented and in no cardiopulmonary distress.  HEENT: No facial asymmetry, EOMI,     Neck supple .  Chest: Clear to auscultation bilaterally.  CVS: S1, S2 no murmurs, no S3.Regular rate.  ABD: Soft non tender.   Ext: No edema  MS: Adequate ROM spine, shoulders, hips and knees.  Psych: Good eye contact, normal affect. Memory intact not anxious or depressed appearing.  CNS: CN 2-12 intact, power,  normal throughout.no focal deficits noted.   Assessment & Plan  School health examination MMR immunity labs ordered, up to date with TDAP  Nausea nausea of  unknown origin, denies fever, chills, HA, dizziness  Start zofran $RemoveBefo'4mg'KIrwrVHIHjj$  every 8 hours as needed. Eat bland foods,avoid spicy fatty foods, drink ginger ale as needed.

## 2022-05-27 LAB — MEASLES/MUMPS/RUBELLA IMMUNITY
MUMPS ABS, IGG: <9 AU/mL — ABNORMAL LOW (ref >10.9)
RUBEOLA AB, IGG: 29.5 AU/mL (ref >16.4)
Rubella Antibodies, IGG: <0.9 index — ABNORMAL LOW (ref >0.99)

## 2022-05-27 NOTE — Progress Notes (Signed)
She is immune  to [place] only so  she will need 2 doses of the MMR vaccine, she can get it at the health department if we do not  have the vaccine here.

## 2022-05-28 ENCOUNTER — Telehealth: Payer: Self-pay | Admitting: Nurse Practitioner

## 2022-05-28 ENCOUNTER — Other Ambulatory Visit: Payer: Self-pay

## 2022-05-28 MED ORDER — UNABLE TO FIND: 0 refills | Status: DC

## 2022-05-28 NOTE — Telephone Encounter (Signed)
done

## 2022-05-28 NOTE — Telephone Encounter (Signed)
Please advise 

## 2022-05-31 ENCOUNTER — Other Ambulatory Visit: Payer: Self-pay | Admitting: Physical Medicine and Rehabilitation

## 2022-05-31 ENCOUNTER — Telehealth: Payer: Self-pay | Admitting: Physical Medicine and Rehabilitation

## 2022-05-31 MED ORDER — DIAZEPAM 5 MG PO TABS: ORAL_TABLET | ORAL | 0 refills | Status: DC

## 2022-05-31 NOTE — Telephone Encounter (Signed)
I called patient and advised. 

## 2022-05-31 NOTE — Telephone Encounter (Signed)
Pt called stating that she has an upcoming appt 9/12 and Dr Ernestina Patches only prescribed 2 of diazepam and need more for appt. Please send to pharmacy on file. Pt phone number is [phone].

## 2022-06-01 ENCOUNTER — Ambulatory Visit (INDEPENDENT_AMBULATORY_CARE_PROVIDER_SITE_OTHER): Admitting: Physical Medicine and Rehabilitation

## 2022-06-01 ENCOUNTER — Ambulatory Visit: Payer: Self-pay

## 2022-06-01 VITALS — BP 133/84 | HR 79 | Ht 65.0 in | Wt 170.0 lb

## 2022-06-01 DIAGNOSIS — M47816 Spondylosis without myelopathy or radiculopathy, lumbar region: Secondary | ICD-10-CM | POA: Diagnosis not present

## 2022-06-01 MED ORDER — BUPIVACAINE HCL 0.5 % IJ SOLN
3.0000 mL | Freq: Once | INTRAMUSCULAR | Status: AC
  Administered 2022-06-01: 3 mL

## 2022-06-01 NOTE — Progress Notes (Signed)
Low back pain radiating down both legs.  Not on blood thinner +driver

## 2022-06-15 NOTE — Progress Notes (Signed)
Gabriella Schmidt - 41 y.o. female MRN [id number]  Date of birth: 1981/02/28  Office Visit Note: Visit Date: 06/01/2022 PCP: Renee Rival, FNP Referred by: Renee Rival, FNP  Subjective: Chief Complaint  Patient presents with   Lower Back - Pain   HPI:  Gabriella Schmidt is a 41 y.o. female who comes in today at the request of Dr. Basil Dess for planned Bilateral  L5-S1 Lumbar facet/medial branch block with fluoroscopic guidance.  The patient has failed conservative care including home exercise, medications, time and activity modification.  This injection will be diagnostic and hopefully therapeutic.  Please see requesting physician notes for further details and justification.  Exam has shown concordant pain with facet joint loading.   ROS Otherwise per HPI.  Assessment & Plan: Visit Diagnoses:    ICD-10-CM   1. Spondylosis without myelopathy or radiculopathy, lumbar region  M47.816 XR C-ARM NO REPORT    bupivacaine (MARCAINE) 0.5 % (with pres) injection 3 mL    Facet Injection      Plan: No additional findings.   Meds & Orders:  Meds ordered this encounter  Medications   bupivacaine (MARCAINE) 0.5 % (with pres) injection 3 mL    Orders Placed This Encounter  Procedures   Facet Injection   XR C-ARM NO REPORT    Follow-up: Return for visit to requesting provider as needed.   Procedures: No procedures performed  Lumbar Facet Joint Intra-Articular Injection(s) with Fluoroscopic Guidance  Patient: Gabriella Schmidt      Date of Birth: 12/01/1980 MRN: [id number] PCP: Renee Rival, FNP      Visit Date: 06/01/2022   Universal Protocol:    Date/Time: 06/01/2022  Consent Given By: the patient  Position: PRONE   Additional Comments: Vital signs were monitored before and after the procedure. Patient was prepped and draped in the usual sterile fashion. The correct patient, procedure, and site was verified.   Injection Procedure Details:  Procedure  Site One Meds Administered:  Meds ordered this encounter  Medications   bupivacaine (MARCAINE) 0.5 % (with pres) injection 3 mL     Laterality: Bilateral  Location/Site:  L5-S1  Needle size: 22 guage  Needle type: Spinal  Needle Placement: Articular  Findings:  -Comments: Excellent flow of contrast producing a partial arthrogram.  Procedure Details: The fluoroscope beam is vertically oriented in AP, and the inferior recess is visualized beneath the lower pole of the inferior apophyseal process, which represents the target point for needle insertion. When direct visualization is difficult the target point is located at the medial projection of the vertebral pedicle. The region overlying each aforementioned target is locally anesthetized with a 1 to 2 ml. volume of 1% Lidocaine without Epinephrine.   The spinal needle was inserted into each of the above mentioned facet joints using biplanar fluoroscopic guidance. A 0.25 to 0.5 ml. volume of Isovue-250 was injected and a partial facet joint arthrogram was obtained. A single spot film was obtained of the resulting arthrogram.    One to 1.25 ml of the steroid/anesthetic solution was then injected into each of the facet joints noted above.   Additional Comments:  The patient tolerated the procedure well Dressing: 2 x 2 sterile gauze and Band-Aid    Post-procedure details: Patient was observed during the procedure. Post-procedure instructions were reviewed.  Patient left the clinic in stable condition.    Clinical History: No specialty comments available.     Objective:  VS:  HT:'5\' 5"'$  (  165.1 cm)   WT:170 lb (77.1 kg)  BMI:28.29    BP:133/84  HR:79bpm  TEMP: ( )  RESP:  Physical Exam Vitals and nursing note reviewed.  Constitutional:      General: She is not in acute distress.    Appearance: Normal appearance. She is not ill-appearing.  HENT:     Head: Normocephalic and atraumatic.     Right Ear: External ear normal.      Left Ear: External ear normal.  Eyes:     Extraocular Movements: Extraocular movements intact.  Cardiovascular:     Rate and Rhythm: Normal rate.     Pulses: Normal pulses.  Pulmonary:     Effort: Pulmonary effort is normal. No respiratory distress.  Abdominal:     General: There is no distension.     Palpations: Abdomen is soft.  Musculoskeletal:        General: Tenderness present.     Cervical back: Neck supple.     Right lower leg: No edema.     Left lower leg: No edema.     Comments: Patient has good distal strength with no pain over the greater trochanters.  No clonus or focal weakness.  Skin:    Findings: No erythema, lesion or rash.  Neurological:     General: No focal deficit present.     Mental Status: She is alert and oriented to person, place, and time.     Sensory: No sensory deficit.     Motor: No weakness or abnormal muscle tone.     Coordination: Coordination normal.  Psychiatric:        Mood and Affect: Mood normal.        Behavior: Behavior normal.      Imaging: No results found.

## 2022-06-15 NOTE — Procedures (Signed)
Lumbar Facet Joint Intra-Articular Injection(s) with Fluoroscopic Guidance  Patient: Gabriella Schmidt      Date of Birth: [birth year] MRN: [id number] PCP: Renee Rival, FNP      Visit Date: 06/01/2022   Universal Protocol:    Date/Time: 06/01/2022  Consent Given By: the patient  Position: PRONE   Additional Comments: Vital signs were monitored before and after the procedure. Patient was prepped and draped in the usual sterile fashion. The correct patient, procedure, and site was verified.   Injection Procedure Details:  Procedure Site One Meds Administered:  Meds ordered this encounter  Medications   bupivacaine (MARCAINE) 0.5 % (with pres) injection 3 mL     Laterality: Bilateral  Location/Site:  L5-S1  Needle size: 22 guage  Needle type: Spinal  Needle Placement: Articular  Findings:  -Comments: Excellent flow of contrast producing a partial arthrogram.  Procedure Details: The fluoroscope beam is vertically oriented in AP, and the inferior recess is visualized beneath the lower pole of the inferior apophyseal process, which represents the target point for needle insertion. When direct visualization is difficult the target point is located at the medial projection of the vertebral pedicle. The region overlying each aforementioned target is locally anesthetized with a 1 to 2 ml. volume of 1% Lidocaine without Epinephrine.   The spinal needle was inserted into each of the above mentioned facet joints using biplanar fluoroscopic guidance. A 0.25 to 0.5 ml. volume of Isovue-250 was injected and a partial facet joint arthrogram was obtained. A single spot film was obtained of the resulting arthrogram.    One to 1.25 ml of the steroid/anesthetic solution was then injected into each of the facet joints noted above.   Additional Comments:  The patient tolerated the procedure well Dressing: 2 x 2 sterile gauze and Band-Aid    Post-procedure details: Patient was  observed during the procedure. Post-procedure instructions were reviewed.  Patient left the clinic in stable condition.

## 2022-06-17 ENCOUNTER — Ambulatory Visit: Admitting: Specialist

## 2022-06-22 ENCOUNTER — Ambulatory Visit (INDEPENDENT_AMBULATORY_CARE_PROVIDER_SITE_OTHER): Admitting: Specialist

## 2022-06-22 ENCOUNTER — Encounter: Payer: Self-pay | Admitting: Specialist

## 2022-06-22 VITALS — BP 99/62 | HR 67 | Ht 65.0 in | Wt 170.0 lb

## 2022-06-22 DIAGNOSIS — M5136 Other intervertebral disc degeneration, lumbar region: Secondary | ICD-10-CM

## 2022-06-22 DIAGNOSIS — S338XXA Sprain of other parts of lumbar spine and pelvis, initial encounter: Secondary | ICD-10-CM

## 2022-06-22 DIAGNOSIS — M461 Sacroiliitis, not elsewhere classified: Secondary | ICD-10-CM

## 2022-06-22 DIAGNOSIS — M533 Sacrococcygeal disorders, not elsewhere classified: Secondary | ICD-10-CM | POA: Diagnosis not present

## 2022-06-22 NOTE — Patient Instructions (Signed)
I agree we should wait and work with a Rheumatologist to determine it there is a less invasive Way to improve your pain. Presently I believe there are suggestions of sacroiliac degeneration, Disc degeneration L5-S1 and Coccygodynia. NSAIDs and use of a tricyclic antidepressant are appropriate and a sacroeeze car seat.

## 2022-06-22 NOTE — Progress Notes (Signed)
Office Visit Note   Patient: Gabriella Schmidt           Date of Birth: August 30, 1981           MRN: [id number] Visit Date: 06/22/2022              Requested by: Renee Rival, [address] [place],  [place] [id number] PCP: Renee Rival, FNP   Assessment & Plan: Visit Diagnoses:  1. Sacroiliitis ([place])   2. Sacroiliac inflammation ([place])   3. Coccyx sprain, initial encounter   4. Coccygodynia   5. Degenerative disc disease, lumbar     Plan: I agree we should wait and work with a Rheumatologist to determine it there is a less invasive Way to improve your pain. Presently I believe there are suggestions of sacroiliac degeneration, Disc degeneration L5-S1 and Coccygodynia. NSAIDs and use of a tricyclic antidepressant are appropriate and a sacroeeze car seat.  Follow-Up Instructions: Return in about 6 weeks (around 08/03/2022) for Appt with Dr. Rolena Infante sports medicine for Sacroiliac and coccygodynia pain..   Orders:  No orders of the defined types were placed in this encounter.  No orders of the defined types were placed in this encounter.     Procedures: No procedures performed   Clinical Data: No additional findings.   Subjective: Chief Complaint  Patient presents with   Lower Back - Pain    41 year old female with history of back buttock, sacroiliac and coccyx pain. Evaluation shows elevated CRP and mildly elevated ANA 1:40 hrCRP is >10. Sed rate is normal. She has pain with sitting in her truck and riding for long periods.    Review of Systems  Constitutional: Negative.   HENT: Negative.    Eyes: Negative.   Respiratory: Negative.    Cardiovascular: Negative.   Gastrointestinal: Negative.   Endocrine: Negative.   Genitourinary: Negative.   Musculoskeletal: Negative.   Skin: Negative.   Allergic/Immunologic: Negative.   Neurological: Negative.   Hematological: Negative.   Psychiatric/Behavioral: Negative.        Objective: Vital Signs: BP 99/62 (BP Location: Left Arm, Patient Position: Sitting, Cuff Size: Large)   Pulse 67   Ht '5\' 5"'$  (1.651 m)   Wt 170 lb (77.1 kg)   LMP 05/07/2022 (Exact Date)   BMI 28.29 kg/m   Physical Exam Constitutional:      Appearance: She is well-developed.  HENT:     Head: Normocephalic and atraumatic.  Eyes:     Pupils: Pupils are equal, round, and reactive to light.  Pulmonary:     Effort: Pulmonary effort is normal.     Breath sounds: Normal breath sounds.  Abdominal:     General: Bowel sounds are normal.     Palpations: Abdomen is soft.  Musculoskeletal:     Cervical back: Normal range of motion and neck supple.     Lumbar back: Negative right straight leg raise test and negative left straight leg raise test.  Skin:    General: Skin is warm and dry.  Neurological:     Mental Status: She is alert and oriented to person, place, and time.  Psychiatric:        Behavior: Behavior normal.        Thought Content: Thought content normal.        Judgment: Judgment normal.    Back Exam   Tenderness  The patient is experiencing tenderness in the sacroiliac.  Range of Motion  Extension:  normal  Flexion:  abnormal  Lateral bend right:  normal  Lateral bend left:  normal  Rotation right:  normal  Rotation left:  normal   Tests  Straight leg raise right: negative Straight leg raise left: negative  Other  Toe walk: normal Heel walk: normal Sensation: normal Gait: antalgic  Erythema: no back redness Scars: absent  Comments:  Coccyx pain Offered order fluoro injection but we decided that it is reasonable to have input from [place] with coccyx area cut out is a benefit with riding in truck and she has recommended this for her mother with similar condition.      Specialty Comments:  No specialty comments available.  Imaging: No results found.   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   School health examination 12/10/2021   Weight gain 11/23/2021   Fatigue 11/23/2021   Nausea 11/23/2021   Breast tenderness 11/23/2021   Mass of upper outer quadrant of left breast 11/23/2021   Mass of lower inner quadrant of right breast 11/23/2021   Proteinuria 11/23/2021   Urinary frequency 11/23/2021   Pregnancy test negative 11/23/2021   Anemia 09/02/2021   COVID-19 04/17/2021   Insomnia 01/14/2021   Back pain 01/14/2021   Liver function test abnormality 10/06/2020   Hyperlipidemia 10/06/2020   Dizziness 10/06/2020   Degenerative disc disease at L5-S1 level 10/06/2020   Asthma 09/01/2020   Chronic hypertension 01/31/2020   Depression with anxiety 01/31/2020   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   Hypertension     Family History  Problem Relation Age of Onset   Hypertension Mother    Hypertension Father    Cardiomyopathy Father    Hypertension Maternal Grandmother    Hypertension Maternal Grandfather     Past Surgical History:  Procedure Laterality Date   TOOTH EXTRACTION     TUBAL LIGATION N/A 06/29/2020   Procedure: POST PARTUM TUBAL LIGATION;  Surgeon: Osborne Oman, MD;  Location: [facility];  Service: Gynecology;  Laterality: N/A;   Social History   Occupational History   Not on file  Tobacco Use   Smoking status: Former   Smokeless tobacco: Never  Scientific laboratory technician Use: Former  Substance and Sexual Activity   Alcohol use: Not Currently   Drug use: Never   Sexual activity: Yes    Birth  control/protection: Surgical    Comment: tubal

## 2022-07-05 ENCOUNTER — Ambulatory Visit: Admitting: Adult Health

## 2022-07-07 ENCOUNTER — Encounter: Payer: Self-pay | Admitting: Adult Health

## 2022-07-07 ENCOUNTER — Ambulatory Visit (INDEPENDENT_AMBULATORY_CARE_PROVIDER_SITE_OTHER): Admitting: Adult Health

## 2022-07-07 VITALS — BP 106/66 | HR 74 | Ht 65.0 in | Wt 175.5 lb

## 2022-07-07 DIAGNOSIS — N941 Unspecified dyspareunia: Secondary | ICD-10-CM | POA: Diagnosis not present

## 2022-07-07 DIAGNOSIS — Z3202 Encounter for pregnancy test, result negative: Secondary | ICD-10-CM | POA: Diagnosis not present

## 2022-07-07 DIAGNOSIS — N926 Irregular menstruation, unspecified: Secondary | ICD-10-CM | POA: Diagnosis not present

## 2022-07-07 DIAGNOSIS — N644 Mastodynia: Secondary | ICD-10-CM | POA: Diagnosis not present

## 2022-07-07 LAB — POCT URINE PREGNANCY: Preg Test, Ur: NEGATIVE

## 2022-07-07 MED ORDER — SLYND 4 MG PO TABS: 1.0000 | ORAL_TABLET | Freq: Every day | ORAL | 0 refills | Status: DC

## 2022-07-07 NOTE — Progress Notes (Signed)
  Subjective:     Patient ID: Gabriella Schmidt, female   DOB: [birth year], 41 y.o.   MRN: [id number]  HPI Gabriella Schmidt is a 41 year old white female, married, G1P0101, in complaining of 2 weeks long periods and breasts hurt. Has pain with sex, says pelvic area feels bruised. She says she took myfembree in past and it helped, but too expensive.   Last pap was negative HPV and malignancy 02/21/20.  Review of Systems  2 week long periods Pain in both breasts R>L Pain with sex Reviewed past medical,surgical, social and family history. Reviewed medications and allergies.     Objective:   Physical Exam BP 106/66 (BP Location: Left Arm, Patient Position: Sitting, Cuff Size: Normal)   Pulse 74   Ht '5\' 5"'$  (1.651 m)   Wt 175 lb 8 oz (79.6 kg)   LMP 06/29/2022   BMI 29.20 kg/m  UPT is negative.  Skin warm and dry,  Breasts:no dominate palpable mass, retraction or nipple discharge, has bilateral regular irregularities and tender both breasts. Pelvic: external genitalia is normal in appearance no lesions, vagina: scant blood,urethra has no lesions or masses noted, cervix:smooth and bulbous, uterus: normal size, shape and contour, non tender, no masses felt, adnexa: no masses or tenderness noted. Bladder is non tender and no masses felt.    Fall risk is low  [facility] - 07/07/22 1021       Pregnancy Intention Screening   Does the patient want to become pregnant in the next year? No    Does the patient's partner want to become pregnant in the next year? No    Would the patient like to discuss contraceptive options today? No      Contraception Wrap Up   Current Method Female Sterilization;Vasectomy    End Method Female Sterilization;Vasectomy    Contraception Counseling Provided No    How was the end contraceptive method provided? N/A            Examination chaperoned by Levy Pupa LPN  Assessment:      1. Pregnancy examination or test, negative result   2. Pain of both breasts Has  decreased caffeine Wear supportive bra will watch She had diagnostic mammogram 12/10/21 and has biopsy on left at 12 0' clock has fibroadenoma   3. Prolonged periods Last period still on Having 2 weeks periods Will try slynd, 3 packs given Meds ordered this encounter  Medications   Drospirenone (SLYND) 4 MG TABS    Sig: Take 1 tablet by mouth daily.    Dispense:  84 tablet    Refill:  0    Order Specific Question:   Supervising Provider    Answer:   Elonda Husky, LUTHER H [[id number]]     4. Dyspareunia, female  Will see if slynd helps, if not may ty myfembree again My be endometriosis   Plan:     Follow up in 10 weeks for ROS

## 2022-07-17 IMAGING — MR MR LUMBAR SPINE W/O CM
5 series · 31 of 48 positions shown · non-contrast
Comparison: Radiograph dated April 23, 2021

CLINICAL DATA: Chronic low back pain

EXAM:
MRI LUMBAR SPINE WITHOUT CONTRAST
TECHNIQUE: Multiplanar, multisequence MR imaging of the lumbar spine was
performed. No intravenous contrast was administered.

[Series 5: T2 · sagittal · 4.0mm · 0.68mm/px · 6 of 15 slices shown (1 of 2)]
[im 1/15]
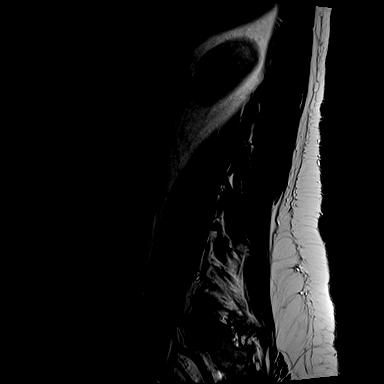
[im 3/15]
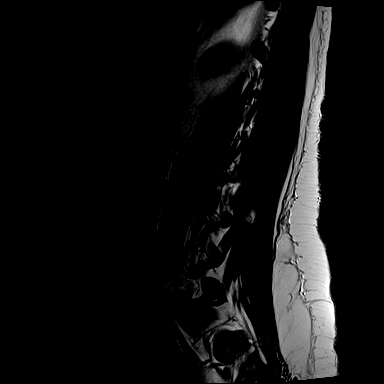
[im 6/15]
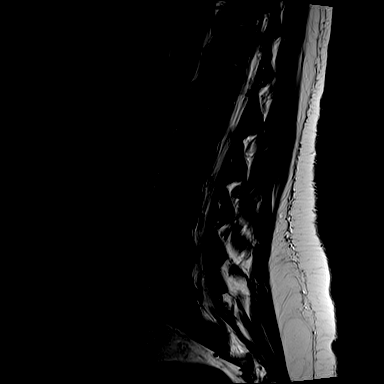
[im 9/15]
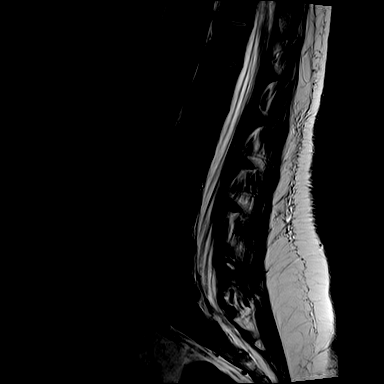
[im 12/15]
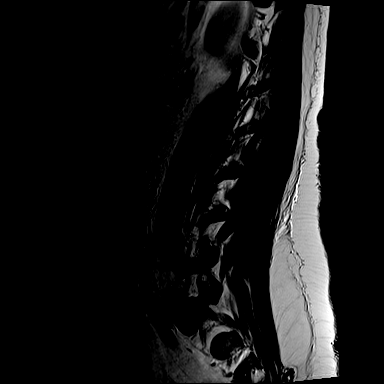
[im 15/15]
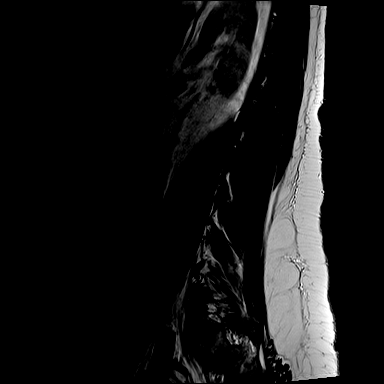

[Series 6: T1 · sagittal · 4.0mm · 0.81mm/px · 7 of 15 slices shown (1 of 2)]
[im 1/15]
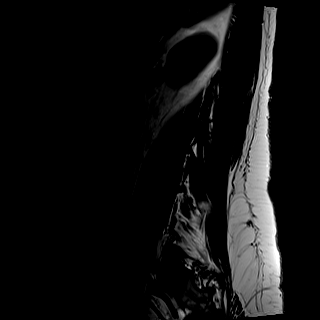
[im 3/15]
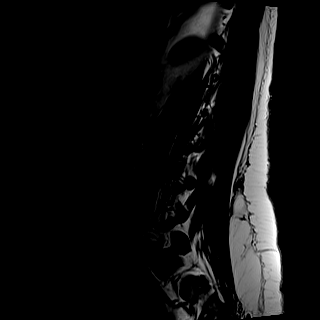
[im 5/15]
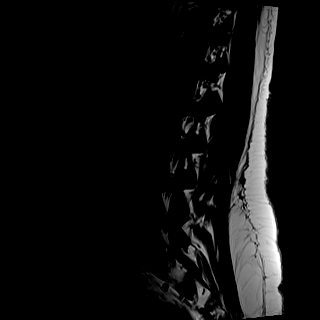
[im 8/15]
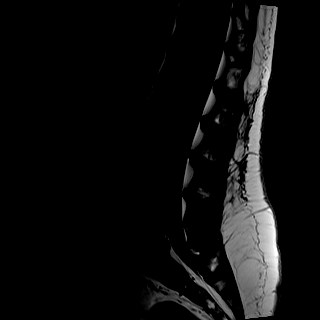
[im 10/15]
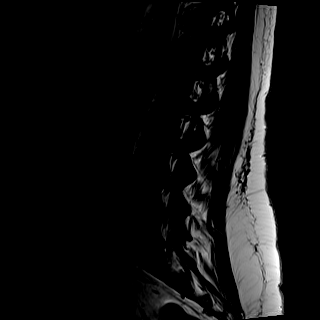
[im 12/15]
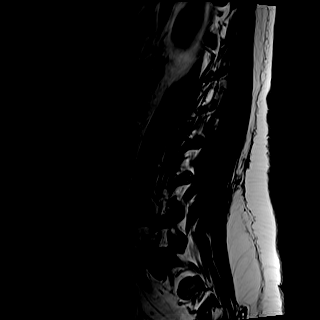
[im 15/15]
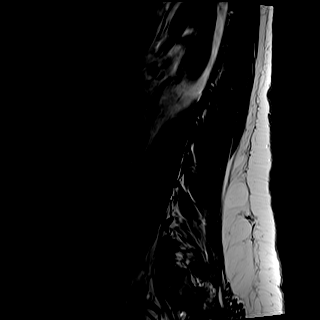

[Series 7: STIR · sagittal · 4.0mm · 0.51mm/px · 2 of 15 slices shown]
[im 1/15]
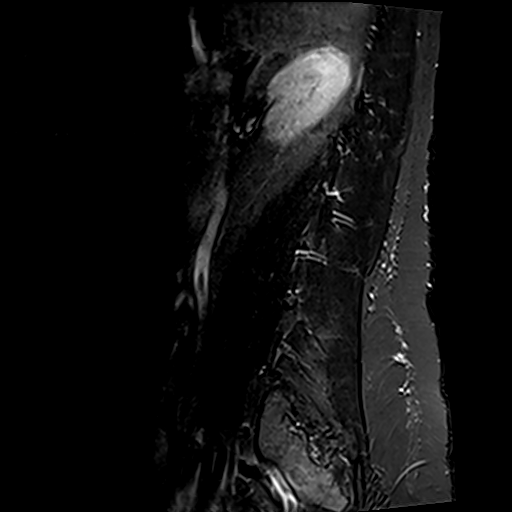
[im 3/15]
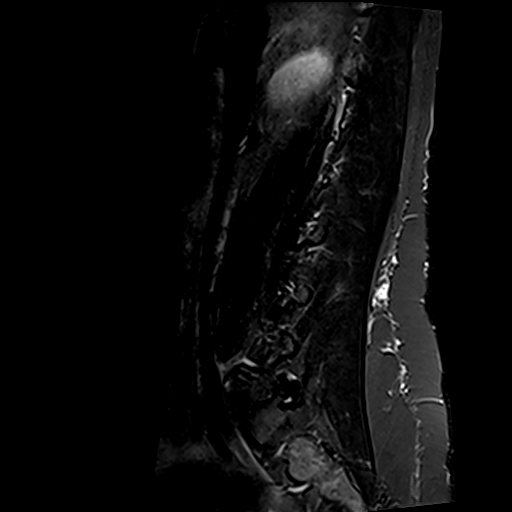

[Series 8: T2 · axial · 4.0mm · 0.70mm/px · z∈[-126,+63]mm · 8 of 32 slices shown (2 of 2)]
[im 1/32]
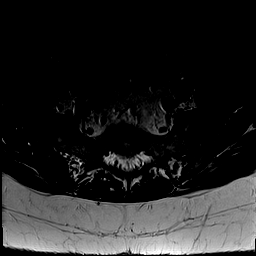
[im 5/32]
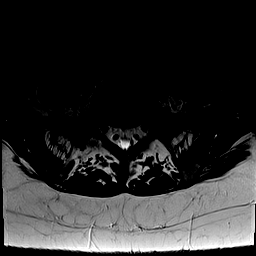
[im 10/32]
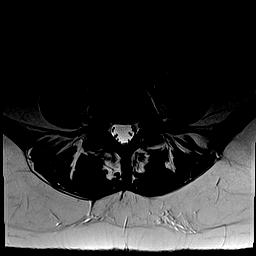
[im 15/32]
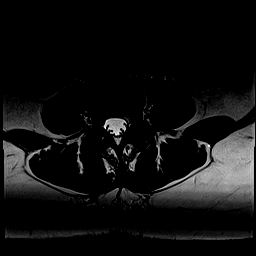
[im 17/32]
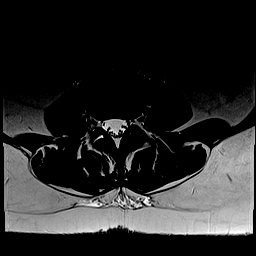
[im 22/32]
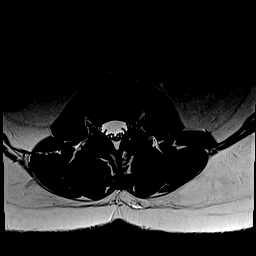
[im 27/32]
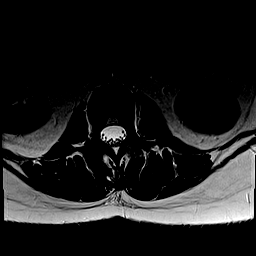
[im 32/32]
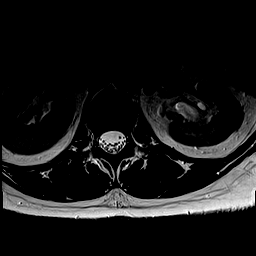

[Series 9: T1 · axial · 4.0mm · 0.35mm/px · z∈[-126,+63]mm · 8 of 32 slices shown (2 of 2)]
[im 1/32]
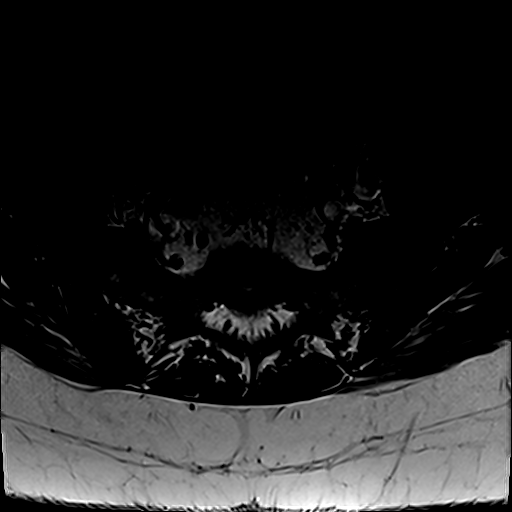
[im 5/32]
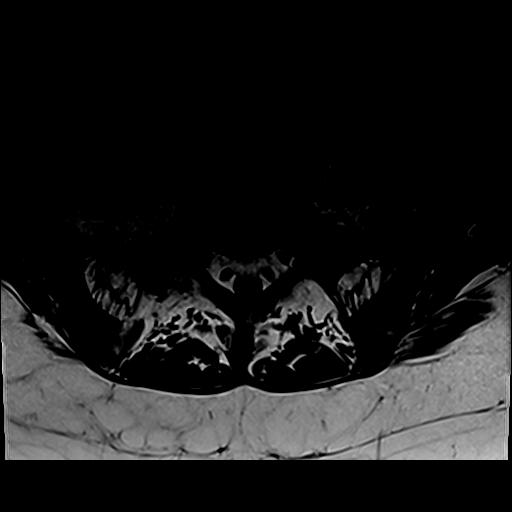
[im 10/32]
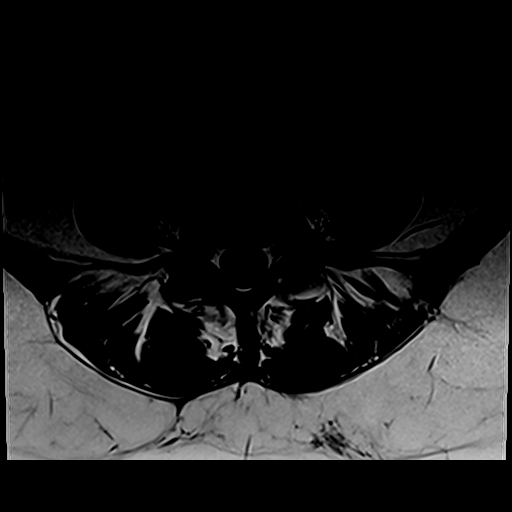
[im 15/32]
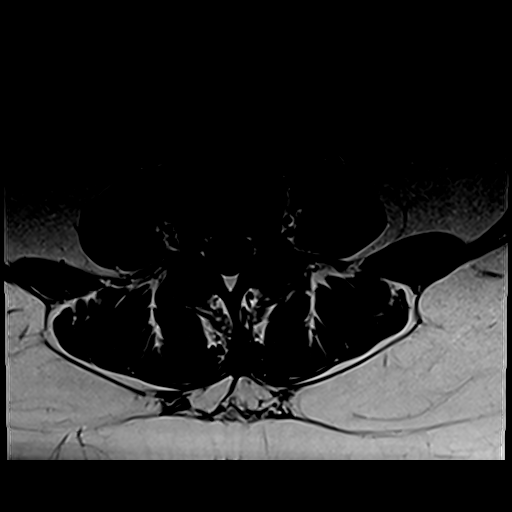
[im 17/32]
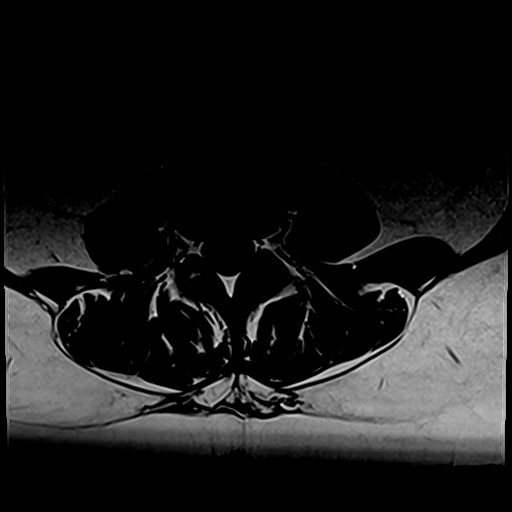
[im 22/32]
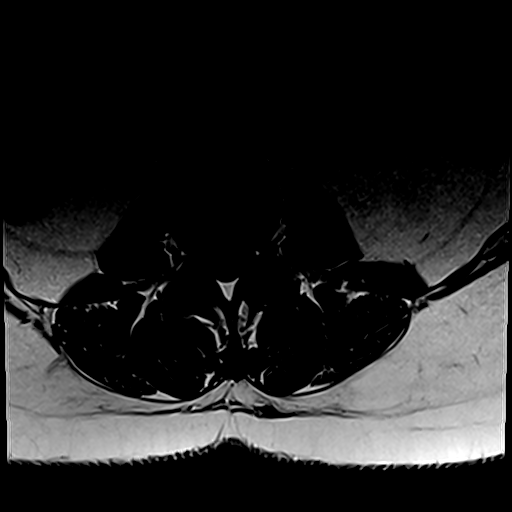
[im 27/32]
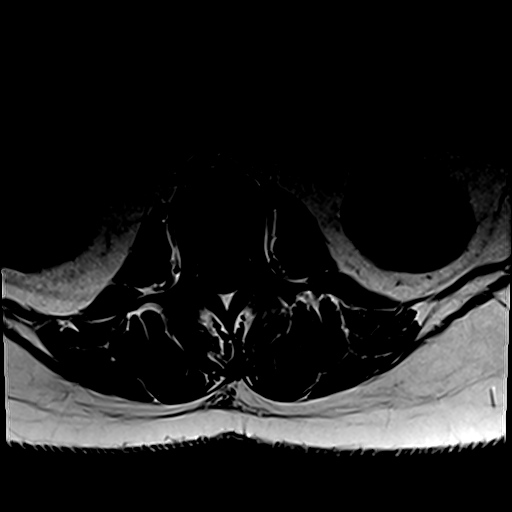
[im 32/32]
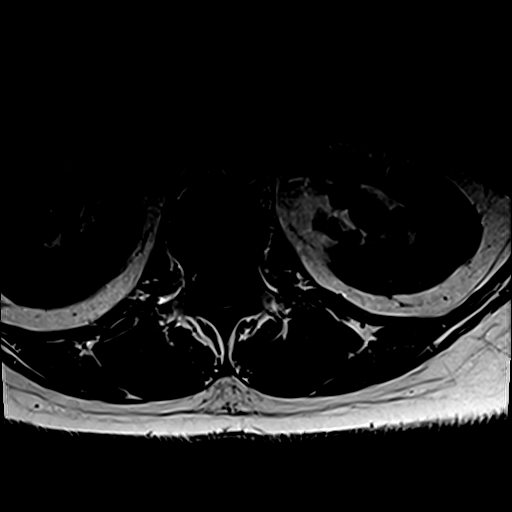

[31 of 48 positions shown; findings below may reference images not displayed]

FINDINGS: Segmentation:   5 non rib-bearing lumbar type vertebral bodies are
present. The lowest fully formed vertebral body is L5.

Alignment:  Mild retrolisthesis at L5-S1.

Vertebrae:  No fracture, evidence of discitis, or bone lesion.

Conus medullaris and cauda equina: Conus extends to the L1 level.
Conus and cauda equina appear normal.

Paraspinal and other soft tissues: Negative.

Disc levels:

T12-L1: No significant disc bulge. No neural foraminal stenosis. No
central canal stenosis.

L1-L2: No significant disc bulge. No neural foraminal stenosis. No
central canal stenosis.

L2-L3: No significant disc bulge. No neural foraminal stenosis. No
central canal stenosis. Mild facet joint arthropathy.

L3-L4: No significant disc bulge. No neural foraminal stenosis. No
central canal stenosis. Mild facet joint arthropathy.

L4-L5: Mild disc bulge without significant spinal canal or neural
foraminal stenosis. Mild facet joint arthropathy.

L5-S1: Disc desiccation and height loss. Mild disc bulge without
significant spinal canal or neural foraminal stenosis. Mild facet
joint arthropathy.
IMPRESSION: 1.  No evidence of fracture.

2.  Visualized cord and cauda equina are within normal limits.

3. Mild degenerate disc disease prominent at L5-S1 with disc
desiccation and height loss. No significant spinal canal or neural
foraminal stenosis. Mild multilevel facet joint arthropathy.

## 2022-07-25 ENCOUNTER — Other Ambulatory Visit: Payer: Self-pay | Admitting: Physical Medicine & Rehabilitation

## 2022-07-27 ENCOUNTER — Ambulatory Visit: Attending: Internal Medicine | Admitting: Internal Medicine

## 2022-07-27 ENCOUNTER — Encounter: Payer: Self-pay | Admitting: Internal Medicine

## 2022-07-27 VITALS — BP 110/76 | HR 78 | Resp 15 | Ht 65.0 in | Wt 169.8 lb

## 2022-07-27 DIAGNOSIS — R7982 Elevated C-reactive protein (CRP): Secondary | ICD-10-CM

## 2022-07-27 DIAGNOSIS — R768 Other specified abnormal immunological findings in serum: Secondary | ICD-10-CM | POA: Insufficient documentation

## 2022-07-27 DIAGNOSIS — M5137 Other intervertebral disc degeneration, lumbosacral region: Secondary | ICD-10-CM | POA: Diagnosis not present

## 2022-07-27 DIAGNOSIS — M461 Sacroiliitis, not elsewhere classified: Secondary | ICD-10-CM
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
## 2022-07-28 LAB — SJOGRENS SYNDROME-A EXTRACTABLE NUCLEAR ANTIBODY: SSA (Ro) (ENA) Antibody, IgG: <1 AI

## 2022-07-28 LAB — ANTI-SMITH ANTIBODY: ENA SM Ab Ser-aCnc: <1 AI

## 2022-07-28 LAB — ANTI-DNA ANTIBODY, DOUBLE-STRANDED: ds DNA Ab: 1 IU/mL

## 2022-07-28 LAB — SJOGRENS SYNDROME-B EXTRACTABLE NUCLEAR ANTIBODY: SSB (La) (ENA) Antibody, IgG: <1 AI

## 2022-07-28 LAB — SEDIMENTATION RATE: Sed Rate: 41 mm/h — ABNORMAL HIGH (ref 0–20)

## 2022-07-28 LAB — C-REACTIVE PROTEIN: CRP: 18.3 mg/L — ABNORMAL HIGH (ref <8.0)

## 2022-07-29 ENCOUNTER — Encounter: Payer: Self-pay | Admitting: Family Medicine

## 2022-08-02 ENCOUNTER — Other Ambulatory Visit: Payer: Self-pay

## 2022-08-02 DIAGNOSIS — Z23 Encounter for immunization: Secondary | ICD-10-CM

## 2022-08-02 MED ORDER — UNABLE TO FIND: 1.0000 | Freq: Once | 0 refills | Status: AC

## 2022-08-02 NOTE — Telephone Encounter (Signed)
Yes, please write her a rx for the 2nd MMR vaccine.

## 2022-08-02 NOTE — Progress Notes (Signed)
Immunization due

## 2022-08-03 ENCOUNTER — Ambulatory Visit (INDEPENDENT_AMBULATORY_CARE_PROVIDER_SITE_OTHER): Admitting: Sports Medicine

## 2022-08-03 ENCOUNTER — Encounter: Payer: Self-pay | Admitting: Sports Medicine

## 2022-08-03 DIAGNOSIS — M25552 Pain in left hip: Secondary | ICD-10-CM

## 2022-08-03 DIAGNOSIS — M461 Sacroiliitis, not elsewhere classified: Secondary | ICD-10-CM | POA: Diagnosis not present

## 2022-08-03 DIAGNOSIS — R7982 Elevated C-reactive protein (CRP): Secondary | ICD-10-CM | POA: Diagnosis not present

## 2022-08-03 DIAGNOSIS — M25551 Pain in right hip: Secondary | ICD-10-CM

## 2022-08-03 DIAGNOSIS — M533 Sacrococcygeal disorders, not elsewhere classified: Secondary | ICD-10-CM | POA: Diagnosis not present

## 2022-08-03 NOTE — Progress Notes (Signed)
Gabriella Schmidt - 41 y.o. female MRN [id number]  Date of birth: 07-11-1981  Office Visit Note: Visit Date: 08/03/2022 PCP: Renee Rival, FNP Referred by: Renee Rival, FNP  Subjective: No chief complaint on file.  HPI: GLENNDA Schmidt is a pleasant 41 y.o. female who presents today for bilateral sacroiliac pain and coccyx pain.  Patient had been following with Dr. Louanne Skye, as he retired he wanted to make sure that she was having follow-up care here at the office.  She has pain all over the body, but her most bothersome ones are over the bilateral SI joints.  She does take Tylenol No. 4 as well as oral diclofenac, this has been helping to some extent.  Also on gabapentin 300 mg 2-3 times a day.  She does walk quite frequently around campus at [facility] [employer] which can exacerbate the pain.   She has had multiple injections in the SI joints, PSIS/lumbar spine, greater trochanter without much relief in the past.  She did have her first appointment with rheumatology, Dr. Vernelle Emerald on 07/27/2022.  She has had elevated ESR in the past.  Labs 03/2022: ANA 1:40 cytoplasmic, speckled  1:40 nuclear, speckled RF neg ESR 22 hsCRP >10.0  Labs 07/27/22: CRP: 18.3 ESR:  41  Pertinent ROS were reviewed with the patient and found to be negative unless otherwise specified above in HPI.   Assessment & Plan: Visit Diagnoses:  1. Sacroiliitis ([place])   2. Coccygodynia   3. Elevated C-reactive protein (CRP)   4. Hip pain, bilateral    Plan: Met with Vernie and discussed her symptoms today.  She is currently undergoing rheumatology work-up for some elevated inflammatory markers and + ANA.  Discussed with her that I would like to get to the root cause of her pain before treating her symptoms.  We will wait rheumatology lab follow-up and plan first.  I would like to get her started on my home exercise plan for her hips as she does have some lateral left-sided hip pain.  This was sent through MyChart  to her for her to begin 3-4x a week at home.  She will continue her home medications of diclofenac, gabapentin, and Tylenol.  We discussed the role of always trying ultrasound-guided SI joint injection, however she has had fluoroscopy-guided injections in the past without much relief.  She could be a candidate for OMT in the future given that her sacral pain started after the birth of her daughter.  We will follow-up as needed after she gets word from rheumatology.  Follow-up: PRN; will see what rheumatology work-up shows   Meds & Orders: No orders of the defined types were placed in this encounter.  No orders of the defined types were placed in this encounter.    Procedures: No procedures performed      Clinical History: No specialty comments available.  She reports that she quit smoking about 9 years ago. Her smoking use included cigarettes. She has a 6.50 pack-year smoking history. She has been exposed to tobacco smoke. She has never used smokeless tobacco.  Recent Labs    02/25/22 1048 03/24/22 1119  HGBA1C 5.7*  --   LABURIC  --  3.6    Objective:   Vital Signs: LMP 06/29/2022 (Exact Date)   Physical Exam  Gen: Well-appearing, in no acute distress; non-toxic CV: Regular Rate. Well-perfused. Warm.  Resp: Breathing unlabored on room air; no wheezing. Psych: Fluid speech in conversation; appropriate affect; normal thought process Neuro:  Sensation intact throughout. No gross coordination deficits.   Ortho Exam - Lumbar/SI: There is some TTP around the spinous process and the surrounding paraspinal musculature from L3-S1.  There is more exquisite SI joint TTP bilaterally.  Pain is made worse with extension and Cobra position.  Equivocal bilateral strength in the L4-S1 myotome.   Imaging:  CLINICAL DATA:  Chronic sacrococcygeal pain.   EXAM: MRI SACRUM WITHOUT CONTRAST   TECHNIQUE: Multiplanar, multisequence MR imaging of the sacrum was performed. No intravenous contrast  was administered.   COMPARISON:  Sacrum x-rays dated March 24, 2022.   FINDINGS: Bones/Joint/Cartilage   Subtle periarticular marrow edema along the inferior aspect of the left sacroiliac joint (series 4, image 15). No erosive changes. Joint spaces are preserved. No joint effusion. No acute fracture or dislocation. Unchanged small central disc protrusion and annular fissure at L5-S1.   Muscles and Tendons Intact.  No muscle edema or atrophy.   Soft tissue No fluid collection or hematoma. No soft tissue mass. Trace free fluid in the pelvis is likely physiologic.   IMPRESSION: 1. Subtle periarticular marrow edema along the inferior aspect of the left sacroiliac joint, which may be degenerative or reflect mild sacroiliitis. No erosive changes. 2. Unchanged small central disc protrusion and annular fissure at L5-S1.     Electronically Signed   By: Titus Dubin M.D.   On: 04/08/2022 09:37  Past Medical/Family/Surgical/Social History: Medications & Allergies reviewed per EMR, new medications updated. Patient Active Problem List   Diagnosis Date Noted   Bilateral sacroiliitis ([place]) 07/27/2022   CRP elevated 07/27/2022   Positive ANA (antinuclear antibody) 07/27/2022   Pain of both breasts 07/07/2022   Pregnancy examination or test, negative result 07/07/2022   Prolonged periods 07/07/2022   Menorrhagia with regular cycle 03/31/2022   Uterine cramping 03/31/2022   Dyspareunia, female 03/17/2022   Urinary incontinence 03/17/2022   Hematuria 03/17/2022   Overweight (BMI 25.0-29.9) 01/05/2022   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   School health examination 12/10/2021   Weight gain 11/23/2021   Fatigue 11/23/2021   Nausea 11/23/2021   Breast tenderness 11/23/2021   Mass of upper outer quadrant of left breast 11/23/2021   Mass of lower inner quadrant of right breast 11/23/2021   Proteinuria 11/23/2021   Urinary frequency 11/23/2021   Pregnancy test negative  11/23/2021   Anemia 09/02/2021   COVID-19 04/17/2021   Insomnia 01/14/2021   Back pain 01/14/2021   Liver function test abnormality 10/06/2020   Hyperlipidemia 10/06/2020   Dizziness 10/06/2020   Degenerative disc disease at L5-S1 level 10/06/2020   Asthma 09/01/2020   Chronic hypertension 01/31/2020   Depression with anxiety 01/31/2020   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   Hypertension    Family History  Problem Relation Age of Onset   Hypertension Mother    Skin cancer Mother    Hypertension Father    Cardiomyopathy Father    Lymphoma Father    Obesity Brother    Obesity Brother    Obesity Brother    Hypertension Maternal Grandmother    Hypertension Maternal Grandfather    Healthy Daughter    Past Surgical History:  Procedure Laterality Date   TOOTH EXTRACTION  TUBAL LIGATION N/A 06/29/2020   Procedure: POST PARTUM TUBAL LIGATION;  Surgeon: Osborne Oman, MD;  Location: [facility] LD ORS;  Service: Gynecology;  Laterality: N/A;   Social History   Occupational History   Not on file  Tobacco Use   Smoking status: Former    Packs/day: 0.50    Years: 13.00    Total pack years: 6.50    Types: Cigarettes    Quit date: 01/2013    Years since quitting: 9.5    Passive exposure: Past   Smokeless tobacco: Never  Vaping Use   Vaping Use: Former   Quit date: 09/20/2018  Substance and Sexual Activity   Alcohol use: Not Currently   Drug use: Never   Sexual activity: Yes    Birth control/protection: Surgical    Comment: tubal

## 2022-08-16 ENCOUNTER — Other Ambulatory Visit: Payer: Self-pay | Admitting: Nurse Practitioner

## 2022-08-16 DIAGNOSIS — G4700 Insomnia, unspecified: Secondary | ICD-10-CM

## 2022-08-18 NOTE — Telephone Encounter (Signed)
Kindly refill

## 2022-08-30 ENCOUNTER — Encounter: Payer: Self-pay | Admitting: Internal Medicine

## 2022-09-05 ENCOUNTER — Other Ambulatory Visit: Payer: Self-pay | Admitting: Physical Medicine & Rehabilitation

## 2022-09-08 ENCOUNTER — Encounter: Payer: Self-pay | Admitting: Family Medicine

## 2022-09-08 ENCOUNTER — Ambulatory Visit: Admitting: Nurse Practitioner

## 2022-09-08 ENCOUNTER — Ambulatory Visit (INDEPENDENT_AMBULATORY_CARE_PROVIDER_SITE_OTHER): Admitting: Family Medicine

## 2022-09-08 VITALS — BP 121/76 | HR 82 | Ht 65.0 in | Wt 173.0 lb

## 2022-09-08 DIAGNOSIS — I10 Essential (primary) hypertension: Secondary | ICD-10-CM

## 2022-09-08 DIAGNOSIS — E663 Overweight: Secondary | ICD-10-CM

## 2022-09-08 DIAGNOSIS — E038 Other specified hypothyroidism: Secondary | ICD-10-CM

## 2022-09-08 DIAGNOSIS — F418 Other specified anxiety disorders: Secondary | ICD-10-CM

## 2022-09-08 DIAGNOSIS — Z23 Encounter for immunization: Secondary | ICD-10-CM

## 2022-09-08 DIAGNOSIS — R7301 Impaired fasting glucose: Secondary | ICD-10-CM

## 2022-09-08 DIAGNOSIS — E559 Vitamin D deficiency, unspecified: Secondary | ICD-10-CM

## 2022-09-08 DIAGNOSIS — E7849 Other hyperlipidemia: Secondary | ICD-10-CM

## 2022-09-08 MED ORDER — UNABLE TO FIND: 0 refills | Status: DC

## 2022-09-08 NOTE — Assessment & Plan Note (Signed)
She reports increased weight gain She reports walking daily at work and on campus She reports adherence to heart healthy diet and would like to explore weight loss options Discussed intermittent fasting Encouraged to continue with increased physical activities and heart healthy diet Will follow-up in a month Wt Readings from Last 3 Encounters:  09/08/22 173 lb (78.5 kg)  07/27/22 169 lb 12.8 oz (77 kg)  07/07/22 175 lb 8 oz (79.6 kg)

## 2022-09-08 NOTE — Assessment & Plan Note (Signed)
Controlled without medication Encouraged to continue lifestyle modification BP Readings from Last 3 Encounters:  09/08/22 121/76  07/27/22 110/76  07/07/22 106/66

## 2022-09-08 NOTE — Assessment & Plan Note (Signed)
Will assess lipid panel today

## 2022-09-08 NOTE — Progress Notes (Signed)
Established Patient Office Visit  Subjective:  Patient ID: Gabriella Schmidt, female    DOB: 12-Jun-1981  Age: 41 y.o. MRN: [id number]  CC:  Chief Complaint  Patient presents with   Follow-up    6 month f/u from previously seeing Fola, pt would like to discuss weight concerns, states she has been gaining weight, has been changing her diet and has had no success.     HPI Gabriella Schmidt is a 41 y.o. female with past medical history of chronic hypertension, asthma, dizziness, hyperlipidemia, depression anxiety presents for f/u of  chronic medical conditions. For the details of today's visit, please refer to the assessment and plan.     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   Hypertension     Past Surgical History:  Procedure Laterality Date   TOOTH EXTRACTION     TUBAL LIGATION N/A 06/29/2020   Procedure: POST PARTUM TUBAL LIGATION;  Surgeon: Osborne Oman, MD;  Location: [facility];  Service: Gynecology;  Laterality: N/A;    Family History  Problem Relation Age of Onset   Hypertension Mother    Skin cancer Mother    Hypertension Father    Cardiomyopathy Father    Lymphoma Father    Obesity Brother    Obesity Brother    Obesity Brother    Hypertension Maternal Grandmother    Hypertension Maternal Grandfather    Healthy Daughter     Social History   Socioeconomic History   Marital status: Married    Spouse name: Nakkia Mackiewicz   Number of children: Not on file   Years of education: Not on file   Highest education level: Not on file  Occupational History   Not on file  Tobacco Use   Smoking status: Former    Packs/day: 0.50    Years: 13.00    Total pack years: 6.50    Types: Cigarettes    Quit date: 01/2013    Years since quitting: 9.6    Passive  exposure: Past   Smokeless tobacco: Never  Vaping Use   Vaping Use: Former   Quit date: 09/20/2018  Substance and Sexual Activity   Alcohol use: Not Currently   Drug use: Never   Sexual activity: Yes    Birth control/protection: Surgical    Comment: tubal  Other Topics Concern   Not on file  Social History Narrative   Not on file   Social Determinants of Health   Financial Resource Strain: Low Risk  (12/10/2021)   Overall Financial Resource Strain (CARDIA)    Difficulty of Paying Living Expenses: Not hard at all  Food Insecurity: No Food Insecurity (12/10/2021)   Hunger Vital Sign    Worried About Running Out of Food in the Last Year: Never true    [place] in the Last Year: Never true  Transportation Needs: No Transportation Needs (12/10/2021)   PRAPARE - Hydrologist (Medical): No    Lack of Transportation (Non-Medical): No  Physical Activity: Insufficiently Active (12/10/2021)   Exercise Vital Sign    Days of Exercise per Week: 2 days    Minutes of Exercise per Session: 20 min  Stress: No Stress  Concern Present (12/10/2021)   [place]    Feeling of Stress : Only a little  Social Connections: Moderately Integrated (12/10/2021)   Social Connection and Isolation Panel [NHANES]    Frequency of Communication with Friends and Family: More than three times a week    Frequency of Social Gatherings with Friends and Family: More than three times a week    Attends Religious Services: More than 4 times per year    Active Member of [employer] or Organizations: No    Attends Archivist Meetings: Never    Marital Status: Married  Human resources officer Violence: Not At Risk (12/10/2021)   Humiliation, Afraid, Rape, and Kick questionnaire    Fear of Current or Ex-Partner: No    Emotionally Abused: No    Physically Abused: No    Sexually Abused: No    Outpatient Medications Prior to  Visit  Medication Sig Dispense Refill   acetaminophen-codeine (TYLENOL #4) 300-60 MG tablet TAKE 1 TABLET BY MOUTH TWICE DAILY AS NEEDED FOR MODERATE PAIN 60 tablet 2   albuterol (VENTOLIN HFA) 108 (90 Base) MCG/ACT inhaler Inhale 2 puffs into the lungs every 6 (six) hours as needed for wheezing or shortness of breath. 8 g 2   diclofenac (VOLTAREN) 50 MG EC tablet Take 1 tablet (50 mg total) by mouth 2 (two) times daily. (Patient taking differently: Take 50 mg by mouth daily.) 60 tablet 3   Drospirenone (SLYND) 4 MG TABS Take 1 tablet by mouth daily. 84 tablet 0   gabapentin (NEURONTIN) 300 MG capsule TAKE 1 CAPSULE(300 MG) BY MOUTH THREE TIMES DAILY 90 capsule 1   ondansetron (ZOFRAN) 4 MG tablet Take 1 tablet (4 mg total) by mouth every 8 (eight) hours as needed for nausea or vomiting. 20 tablet 0   sertraline (ZOLOFT) 100 MG tablet TAKE 2 TABLETS(200 MG) BY MOUTH DAILY 180 tablet 1   traZODone (DESYREL) 150 MG tablet TAKE 1 TABLET(150 MG) BY MOUTH AT BEDTIME AS NEEDED FOR SLEEP 90 tablet 0   VITAMIN D PO Take by mouth. Once a day     UNABLE TO FIND MMR x 2 Dx: Z27.4 1 each 0   No facility-administered medications prior to visit.    Allergies  Allergen Reactions   Latex Rash    ROS Review of Systems  Constitutional:  Negative for chills and fever.  Eyes:  Negative for visual disturbance.  Respiratory:  Negative for chest tightness and shortness of breath.   Neurological:  Negative for dizziness and headaches.      Objective:    Physical Exam HENT:     Head: Normocephalic.     Mouth/Throat:     Mouth: Mucous membranes are moist.  Cardiovascular:     Rate and Rhythm: Normal rate.     Heart sounds: Normal heart sounds.  Pulmonary:     Effort: Pulmonary effort is normal.     Breath sounds: Normal breath sounds.  Neurological:     Mental Status: She is alert.     BP 121/76   Pulse 82   Ht _0  (1.651 m)   Wt 173 lb (78.5 kg)   SpO2 95%   BMI 28.79 kg/m  Wt  Readings from Last 3 Encounters:  09/08/22 173 lb (78.5 kg)  07/27/22 169 lb 12.8 oz (77 kg)  07/07/22 175 lb 8 oz (79.6 kg)    Lab Results  Component Value Date   TSH 0.940 02/25/2022  Lab Results  Component Value Date   WBC 7.5 02/25/2022   HGB 13.4 02/25/2022   HCT 40.1 02/25/2022   MCV 83 02/25/2022   PLT 326 02/25/2022   Lab Results  Component Value Date   NA 140 02/25/2022   K 4.5 02/25/2022   CO2 22 02/25/2022   GLUCOSE 95 02/25/2022   BUN 10 02/25/2022   CREATININE 0.78 02/25/2022   BILITOT 0.3 02/25/2022   ALKPHOS 91 02/25/2022   AST 22 02/25/2022   ALT 29 02/25/2022   PROT 7.1 02/25/2022   ALBUMIN 4.3 02/25/2022   CALCIUM 9.2 02/25/2022   ANIONGAP 7 11/16/2020   EGFR 100 02/25/2022   Lab Results  Component Value Date   CHOL 168 02/25/2022   Lab Results  Component Value Date   HDL 48 02/25/2022   Lab Results  Component Value Date   LDLCALC 99 02/25/2022   Lab Results  Component Value Date   TRIG 116 02/25/2022   Lab Results  Component Value Date   CHOLHDL 3.5 02/25/2022   Lab Results  Component Value Date   HGBA1C 5.7 (H) 02/25/2022      Assessment & Plan:  Overweight (BMI 25.0-29.9) Assessment & Plan: She reports increased weight gain She reports walking daily at work and on campus She reports adherence to heart healthy diet and would like to explore weight loss options Discussed intermittent fasting Encouraged to continue with increased physical activities and heart healthy diet Will follow-up in a month Wt Readings from Last 3 Encounters:  09/08/22 173 lb (78.5 kg)  07/27/22 169 lb 12.8 oz (77 kg)  07/07/22 175 lb 8 oz (79.6 kg)       Depression with anxiety Assessment & Plan: She denies suicidal ideation homicidal ideation She takes Zoloft 100 mg daily Encouraged to continue treatment regimen   Other hyperlipidemia Assessment & Plan: Will assess lipid panel today  Orders: -     Lipid panel -     CMP14+EGFR -      CBC with Differential/Platelet  Chronic hypertension Assessment & Plan: Controlled without medication Encouraged to continue lifestyle modification BP Readings from Last 3 Encounters:  09/08/22 121/76  07/27/22 110/76  07/07/22 106/66      IFG (impaired fasting glucose) -     Hemoglobin A1c  Vitamin D deficiency -     VITAMIN D 25 Hydroxy (Vit-D Deficiency, Fractures)  Other specified hypothyroidism -     TSH + free T4  Need for MMR vaccine  Other orders -     UNABLE TO FIND; MMR x 2 Dx: Z27.4  Dispense: 1 each; Refill: 0    Follow-up: Return in about 1 month (around 10/09/2022) for obesity.   Alvira Monday, FNP

## 2022-09-08 NOTE — Assessment & Plan Note (Signed)
She denies suicidal ideation homicidal ideation She takes Zoloft 100 mg daily Encouraged to continue treatment regimen

## 2022-09-16 ENCOUNTER — Ambulatory Visit (INDEPENDENT_AMBULATORY_CARE_PROVIDER_SITE_OTHER): Admitting: Adult Health

## 2022-09-16 ENCOUNTER — Encounter: Payer: Self-pay | Admitting: Adult Health

## 2022-09-16 VITALS — BP 116/79 | HR 89 | Ht 65.0 in | Wt 173.0 lb

## 2022-09-16 DIAGNOSIS — N941 Unspecified dyspareunia: Secondary | ICD-10-CM

## 2022-09-16 DIAGNOSIS — N9489 Other specified conditions associated with female genital organs and menstrual cycle: Secondary | ICD-10-CM | POA: Diagnosis not present

## 2022-09-16 DIAGNOSIS — N920 Excessive and frequent menstruation with regular cycle: Secondary | ICD-10-CM

## 2022-09-16 DIAGNOSIS — N644 Mastodynia: Secondary | ICD-10-CM

## 2022-09-16 MED ORDER — SLYND 4 MG PO TABS: 1.0000 | ORAL_TABLET | Freq: Every day | ORAL | 4 refills | Status: DC

## 2022-09-16 NOTE — Progress Notes (Signed)
  Subjective:     Patient ID: Gabriella Schmidt, female   DOB: 11-Feb-1981, 41 y.o.   MRN: [id number]  HPI Gabriella Schmidt is a 41 year old white female,married, G1P0101, back in follow up on starting Slynd and has not had a period or cramping or pain with sex, still has breast pain.     Component Value Date/Time   DIAGPAP  02/21/2020 1419    - Negative for intraepithelial lesion or malignancy (NILM)   HPVHIGH Negative 02/21/2020 1419   ADEQPAP  02/21/2020 1419    Satisfactory for evaluation; transformation zone component PRESENT.   PCP is Alvira Monday, NP.  Review of Systems Denies any bleeding on slynd, or cramps or pain with sex +breast pain still Reviewed past medical,surgical, social and family history. Reviewed medications and allergies.     Objective:   Physical Exam BP 116/79 (BP Location: Left Arm, Patient Position: Sitting, Cuff Size: Normal)   Pulse 89   Ht '5\' 5"'$  (1.651 m)   Wt 173 lb (78.5 kg)   BMI 28.79 kg/m     Skin warm and dry.  Lungs: clear to ausculation bilaterally. Cardiovascular: regular rate and rhythm.   Upstream - 09/16/22 0857       Pregnancy Intention Screening   Does the patient want to become pregnant in the next year? No    Does the patient's partner want to become pregnant in the next year? No    Would the patient like to discuss contraceptive options today? No      Contraception Wrap Up   Current Method Vasectomy;Female Sterilization    End Method Female Sterilization;Vasectomy    Contraception Counseling Provided No             Assessment:     1. Menorrhagia with regular cycle No bleeding on Slynd Will continue Slynd, 3 packs given and Rx sent Meds ordered this encounter  Medications   Drospirenone (SLYND) 4 MG TABS    Sig: Take 1 tablet by mouth daily.    Dispense:  84 tablet    Refill:  4    Order Specific Question:   Supervising Provider    Answer:   Elonda Husky, LUTHER H [[id number]]     2. Uterine cramping Has resolved  3. Dyspareunia,  female Has resolved   4. Pain of both breasts Decrease caffein    Plan:     Pap and physical in 3 months

## 2022-09-21 ENCOUNTER — Encounter: Payer: Self-pay | Admitting: Family Medicine

## 2022-09-21 NOTE — Telephone Encounter (Signed)
Spoke with patient. Her chart shows she had TDAP in 2021.

## 2022-09-22 ENCOUNTER — Other Ambulatory Visit: Payer: Self-pay | Admitting: Family Medicine

## 2022-09-22 DIAGNOSIS — E559 Vitamin D deficiency, unspecified: Secondary | ICD-10-CM

## 2022-09-22 LAB — CMP14+EGFR
ALT: 36 IU/L — ABNORMAL HIGH (ref 0–32)
AST: 27 IU/L (ref 0–40)
Albumin/Globulin Ratio: 1.7 (ref 1.2–2.2)
Albumin: 4.3 g/dL (ref 3.9–4.9)
Alkaline Phosphatase: 89 IU/L (ref 44–121)
BUN/Creatinine Ratio: 15 (ref 9–23)
BUN: 12 mg/dL (ref 6–20)
Bilirubin Total: 0.3 mg/dL (ref 0.0–1.2)
CO2: 20 mmol/L (ref 20–29)
Calcium: 8.8 mg/dL (ref 8.7–10.2)
Chloride: 107 mmol/L — ABNORMAL HIGH (ref 96–106)
Creatinine, Ser: 0.82 mg/dL (ref 0.57–1.00)
Globulin, Total: 2.6 g/dL (ref 1.5–4.5)
Glucose: 100 mg/dL — ABNORMAL HIGH (ref 70–99)
Potassium: 4.6 mmol/L (ref 3.5–5.2)
Sodium: 145 mmol/L — ABNORMAL HIGH (ref 134–144)
Total Protein: 6.9 g/dL (ref 6.0–8.5)
eGFR: 93 mL/min/{1.73_m2} (ref >59)

## 2022-09-22 LAB — CBC WITH DIFFERENTIAL/PLATELET
Basophils Absolute: 0 10*3/uL (ref 0.0–0.2)
Basos: 0 %
EOS (ABSOLUTE): 0.2 10*3/uL (ref 0.0–0.4)
Eos: 3 %
Hematocrit: 39.2 % (ref 34.0–46.6)
Hemoglobin: 12.5 g/dL (ref 11.1–15.9)
Immature Grans (Abs): 0 10*3/uL (ref 0.0–0.1)
Immature Granulocytes: 0 %
Lymphocytes Absolute: 2.2 10*3/uL (ref 0.7–3.1)
Lymphs: 37 %
MCH: 27.4 pg (ref 26.6–33.0)
MCHC: 31.9 g/dL (ref 31.5–35.7)
MCV: 86 fL (ref 79–97)
Monocytes Absolute: 0.5 10*3/uL (ref 0.1–0.9)
Monocytes: 8 %
Neutrophils Absolute: 3 10*3/uL (ref 1.4–7.0)
Neutrophils: 52 %
Platelets: 235 10*3/uL (ref 150–450)
RBC: 4.56 x10E6/uL (ref 3.77–5.28)
RDW: 12.9 % (ref 11.7–15.4)
WBC: 5.9 10*3/uL (ref 3.4–10.8)

## 2022-09-22 LAB — TSH+FREE T4
Free T4: 0.97 ng/dL (ref 0.82–1.77)
TSH: 1.4 u[IU]/mL (ref 0.450–4.500)

## 2022-09-22 LAB — LIPID PANEL
Chol/HDL Ratio: 4 ratio (ref 0.0–4.4)
Cholesterol, Total: 174 mg/dL (ref 100–199)
HDL: 43 mg/dL (ref >39)
LDL Chol Calc (NIH): 106 mg/dL — ABNORMAL HIGH (ref 0–99)
Triglycerides: 143 mg/dL (ref 0–149)
VLDL Cholesterol Cal: 25 mg/dL (ref 5–40)

## 2022-09-22 LAB — HEMOGLOBIN A1C
Est. average glucose Bld gHb Est-mCnc: 126 mg/dL
Hgb A1c MFr Bld: 6 % — ABNORMAL HIGH (ref 4.8–5.6)

## 2022-09-22 LAB — VITAMIN D 25 HYDROXY (VIT D DEFICIENCY, FRACTURES): Vit D, 25-Hydroxy: 21.3 ng/mL — ABNORMAL LOW (ref 30.0–100.0)

## 2022-09-22 MED ORDER — VITAMIN D (ERGOCALCIFEROL) 1.25 MG (50000 UNIT) PO CAPS: 50000.0000 [IU] | ORAL_CAPSULE | ORAL | 2 refills | Status: DC

## 2022-09-22 NOTE — Progress Notes (Signed)
Please inform the patient that her labs indicate that she is prediabetic; I recommend decreasing her intake of high-sugar foods with increased physical activity. Her vitamin D is low; a weekly vitamin D supplement prescription has been sent to your pharmacy. I recommend decreasing her intake of high-sodium foods, as her sodium is slightly elevated. Her kidneys, liver, and thyroid levels are stable.

## 2022-09-27 ENCOUNTER — Other Ambulatory Visit: Payer: Self-pay | Admitting: Nurse Practitioner

## 2022-09-27 DIAGNOSIS — F418 Other specified anxiety disorders: Secondary | ICD-10-CM
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
## 2022-09-28 ENCOUNTER — Ambulatory Visit: Attending: Internal Medicine | Admitting: Internal Medicine

## 2022-09-28 ENCOUNTER — Ambulatory Visit
Admission: RE | Admit: 2022-09-28 | Discharge: 2022-09-28 | Disposition: A | Discharge status: Home / Self Care | Source: Ambulatory Visit | Attending: Internal Medicine | Admitting: Internal Medicine

## 2022-09-28 ENCOUNTER — Encounter: Payer: Self-pay | Admitting: Internal Medicine

## 2022-09-28 VITALS — BP 111/73 | HR 69 | Resp 14 | Ht 65.0 in | Wt 175.0 lb

## 2022-09-28 DIAGNOSIS — M51379 Other intervertebral disc degeneration, lumbosacral region without mention of lumbar back pain or lower extremity pain: Secondary | ICD-10-CM

## 2022-09-28 DIAGNOSIS — M461 Sacroiliitis, not elsewhere classified: Secondary | ICD-10-CM

## 2022-09-28 DIAGNOSIS — M45A7 Non-radiographic axial spondyloarthritis of lumbosacral region: Secondary | ICD-10-CM | POA: Diagnosis not present

## 2022-09-28 DIAGNOSIS — Z79899 Other long term (current) drug therapy: Secondary | ICD-10-CM

## 2022-09-28 DIAGNOSIS — M5137 Other intervertebral disc degeneration, lumbosacral region: Secondary | ICD-10-CM | POA: Diagnosis not present

## 2022-09-28 NOTE — Patient Instructions (Signed)
Certolizumab Pegol Injection What is this medication? CERTOLIZUMAB (SER toe LIZ oo mab) treats autoimmune conditions, such as psoriasis, arthritis, and Crohn's disease. It works by slowing down an overactive immune system. It belongs to a group of medications called TNF inhibitors. It is a monoclonal antibody. This medicine may be used for other purposes; ask your health care provider or pharmacist if you have questions. COMMON BRAND NAME(S): Cimzia What should I tell my care team before I take this medication? They need to know if you have any of these conditions: Cancer Diabetes Guillain-Barre syndrome Heart failure Hepatitis B or history of hepatitis B infection Immune system problems Infection or history of infections Low blood counts, such as low white cell, platelet, or red cell counts Multiple sclerosis Recently received or scheduled to receive a vaccine Tuberculosis, a positive skin test for tuberculosis, or recent close contact with someone who has tuberculosis An unusual or allergic reaction to certolizumab, other medications, latex, rubber, foods, dyes, or preservatives Pregnant or trying to get pregnant Breast-feeding How should I use this medication? This medication is injected under the skin. It is usually given by your care team in a hospital or clinic setting. It may also be given at home. If you get this medication at home, you will be taught how to prepare and give it. Use exactly as directed. Take it as directed on the prescription label at the same time every day. Keep taking it unless your care team tells you to stop. It is important that you put your used needles and syringes in a special sharps container. Do not put them in a trash can. If you do not have a sharps container, call your pharmacist or care team to get one. A special MedGuide will be given to you by the pharmacist with each prescription and refill. Be sure to read this information carefully each  time. Talk to your care team about the use of this medication in children. Special care may be needed. Overdosage: If you think you have taken too much of this medicine contact a poison control center or emergency room at once. NOTE: This medicine is only for you. Do not share this medicine with others. What if I miss a dose? If you get this medication at a hospital or clinic: It is important not to miss your dose. Call your care team if you are unable to keep an appointment. If you give yourself this medication at home: If you miss a dose, take it as soon as you can. If it is almost time for your next dose, take only that dose. Do not take double or extra doses. Call your care team with questions. What may interact with this medication? Do not take this medication with any of the following: Biologic medications, such as abatacept, adalimumab, anakinra, etanercept, golimumab, infliximab, natalizumab, rituximab, secukinumab, tocilizumab, ustekinumab Live vaccines Tofacitinib This list may not describe all possible interactions. Give your health care provider a list of all the medicines, herbs, non-prescription drugs, or dietary supplements you use. Also tell them if you smoke, drink alcohol, or use illegal drugs. Some items may interact with your medicine. What should I watch for while using this medication? Visit your care team for regular checks on your progress. Tell your care team if your symptoms do not start to get better or if they get worse. Your condition will be monitored carefully while you are receiving this medication. You will be tested for tuberculosis (TB) before you start this medication.  If your care team prescribes any medication for TB, you should start taking the TB medication before starting this medication. Make sure to finish the full course of TB medication. This medication may increase your risk of getting an infection. Call your care team for advice if you get a fever,  chills, sore throat, or other symptoms of a cold or flu. Do not treat yourself. Try to avoid being around people who are sick. Talk to your care team about your risk of cancer. You may be more at risk for certain types of cancers if you take this medication. What side effects may I notice from receiving this medication? Side effects that you should report to your care team as soon as possible: Allergic reactions--skin rash, itching, hives, swelling of the face, lips, tongue, or throat Aplastic anemia--unusual weakness or fatigue, dizziness, headache, trouble breathing, increased bleeding or bruising Body pain, tingling, or numbness Heart failure--shortness of breath, swelling of the ankles, feet, or hands, sudden weight gain, unusual weakness or fatigue Infection--fever, chills, cough, sore throat, wounds that don't heal, pain or trouble when passing urine, general feeling of discomfort or being unwell Lupus-like syndrome--joint pain, swelling, or stiffness, butterfly-shaped rash on the face, rashes that get worse in the sun, fever, unusual weakness or fatigue Seizures Unusual bruising or bleeding Side effects that usually do not require medical attention (report to your care team if they continue or are bothersome): Back pain Cough Fatigue Fever Headache Pain, redness, or irritation at injection site Sore throat This list may not describe all possible side effects. Call your doctor for medical advice about side effects. You may report side effects to FDA at 1-800-FDA-1088. Where should I keep my medication? Keep out of the reach of children and pets. Store in the refrigerator. Do not freeze. Keep this medication in the original packaging until you are ready to take it. Protect from light. Get rid of any unused medication after the expiration date. To get rid of medications that are no longer needed or have expired: Take the medication to a medication take-back program. Check with your  pharmacy or law enforcement to find a location. If you cannot return the medication, ask your pharmacist or care team how to get rid of this medication safely. NOTE: This sheet is a summary. It may not cover all possible information. If you have questions about this medicine, talk to your doctor, pharmacist, or health care provider.  2023 Elsevier/Gold Standard (2021-11-19 00:00:00)

## 2022-09-29 ENCOUNTER — Telehealth: Payer: Self-pay | Admitting: Pharmacist

## 2022-09-29 DIAGNOSIS — Z79899 Other long term (current) drug therapy: Secondary | ICD-10-CM

## 2022-09-29 DIAGNOSIS — M45A7 Non-radiographic axial spondyloarthritis of lumbosacral region: Secondary | ICD-10-CM

## 2022-09-29 NOTE — Telephone Encounter (Addendum)
Patient has [facility]. Prescriptions do not require prior authorizations. Rx for Cimzia starter pack will be sent to [facility] once baseline labs have resulted from 09/28/2022. Will continue to monitor.  Knox Saliva, PharmD, MPH, BCPS, CPP Clinical Pharmacist (Rheumatology and Pulmonology)  ----- Message from Shona Needles, RT sent at 09/28/2022 11:32 AM EST ----- Regarding: NEW START CIMZIA

## 2022-09-30 LAB — QUANTIFERON-TB GOLD PLUS
Mitogen-NIL: >10 IU/mL
NIL: 0.01 IU/mL
QuantiFERON-TB Gold Plus: NEGATIVE
TB1-NIL: 0.01 IU/mL
TB2-NIL: 0.01 IU/mL

## 2022-09-30 LAB — HEPATITIS C ANTIBODY: Hepatitis C Ab: NONREACTIVE

## 2022-09-30 LAB — HIV ANTIBODY (ROUTINE TESTING W REFLEX): HIV 1&2 Ab, 4th Generation: NONREACTIVE

## 2022-10-04 MED ORDER — CIMZIA STARTER KIT 6 X 200 MG/ML ~~LOC~~ PSKT: PREFILLED_SYRINGE | SUBCUTANEOUS | 0 refills | Status: DC

## 2022-10-04 NOTE — Telephone Encounter (Signed)
All baseline immunosuppressive labs have resulted. Rx for Cimzia starter pack sent to [facility].  Dose: '400mg'$  SQ at Week 0, Week 2, Week 4 then '400mg'$  SQ every 4 weeks thereafter  I called patient to advise. Unable to reach. VM box is full. Mychart message sent  Knox Saliva, PharmD, MPH, BCPS, CPP Clinical Pharmacist (Rheumatology and Pulmonology)

## 2022-10-12 ENCOUNTER — Ambulatory Visit (INDEPENDENT_AMBULATORY_CARE_PROVIDER_SITE_OTHER): Admitting: Family Medicine

## 2022-10-12 ENCOUNTER — Encounter: Payer: Self-pay | Admitting: Family Medicine

## 2022-10-12 VITALS — BP 125/82 | HR 95 | Ht 65.0 in | Wt 174.0 lb

## 2022-10-12 DIAGNOSIS — E663 Overweight: Secondary | ICD-10-CM

## 2022-10-12 NOTE — Assessment & Plan Note (Signed)
The patient has lost 1 pound since her last visit She reports starting weight watchers 2 weeks ago Reports family history of diabetes Encouraged to implement a heart healthy diet with increased physical activity Educational to follow: Eat three meals per day at times discussed. Cut out all diet bevergages and drink only water Eat whole food plant based meals Cut out junk food, fast food and processed foods Exercise 150 minutes a week Lose 1-2 lbs per week. Keep a food journal Choose foods that grow in a garden or in a fruit orchard and protein of animals with fins or feathers.  Lifestyle Medicine   - Whole Food, Plant Predominant Nutrition is highly recommended: Eat Plenty of vegetables, Mushrooms, fruits, Legumes, Whole Grains, Nuts, seeds in lieu of processed meats, processed snacks/pastries red meat, poultry, eggs.    -It is better to avoid simple carbohydrates including: Cakes, Sweet Desserts, Ice Cream, Soda (diet and regular), Sweet Tea, Candies, Chips, Cookies, Store Bought Juices, Alcohol in Excess of  1-2 drinks a day, Lemonade,  Artificial Sweeteners, Doughnuts, Coffee Creamers, "Sugar-free" Products, etc, etc.  This is not a complete list.....   Exercise: If you are able: 30 -60 minutes a day ,4 days a week, or 150 minutes a week.  The longer the better.  Combine stretch, strength, and aerobic activities.  If you were told in the past that you have high risk for cardiovascular diseases, you may seek evaluation by your heart doctor prior to initiating moderate to intense exercise programs.

## 2022-10-12 NOTE — Patient Instructions (Addendum)
I appreciate the opportunity to provide care to you today!    Follow up:  3 months  Labs: next visit  Eat three meals per day at times discussed. Cut out all diet bevergages and drink only water Eat whole food plant based meals Cut out junk food, fast food and processed foods Exercise 150 minutes a week Lose 1-2 lbs per week. Keep a food journal Choose foods that grow in a garden or in a fruit orchard and protein of animals with fins or feathers.  Lifestyle Medicine   - Whole Food, Plant Predominant Nutrition is highly recommended: Eat Plenty of vegetables, Mushrooms, fruits, Legumes, Whole Grains, Nuts, seeds in lieu of processed meats, processed snacks/pastries red meat, poultry, eggs.    -It is better to avoid simple carbohydrates including: Cakes, Sweet Desserts, Ice Cream, Soda (diet and regular), Sweet Tea, Candies, Chips, Cookies, Store Bought Juices, Alcohol in Excess of  1-2 drinks a day, Lemonade,  Artificial Sweeteners, Doughnuts, Coffee Creamers, "Sugar-free" Products, etc, etc.  This is not a complete list.....   Exercise: If you are able: 30 -60 minutes a day ,4 days a week, or 150 minutes a week.  The longer the better.  Combine stretch, strength, and aerobic activities.  If you were told in the past that you have high risk for cardiovascular diseases, you may seek evaluation by your heart doctor prior to initiating moderate to intense exercise programs.        It was a pleasure to see you and I look forward to continuing to work together on your health and well-being. Please do not hesitate to call the office if you need care or have questions about your care.   Have a wonderful day and week. With Gratitude, Alvira Monday MSN, FNP-BC

## 2022-10-12 NOTE — Progress Notes (Signed)
Established Patient Office Visit  Subjective:  Patient ID: Gabriella Schmidt, female    DOB: September 04, 1981  Age: 42 y.o. MRN: [id number]  CC:  Chief Complaint  Patient presents with   Follow-up    1 month f/u    HPI Gabriella Schmidt is a 42 y.o. female with past medical history of chronic hypertension, depression with anxiety, hyperlipidemia,and  asthma, presents for f/u of  chronic medical conditions.  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   Hypertension    Pre-diabetes     Past Surgical History:  Procedure Laterality Date   TOOTH EXTRACTION     TUBAL LIGATION N/A 06/29/2020   Procedure: POST PARTUM TUBAL LIGATION;  Surgeon: Osborne Oman, MD;  Location: [facility];  Service: Gynecology;  Laterality: N/A;    Family History  Problem Relation Age of Onset   Hypertension Mother    Skin cancer Mother    Hypertension Father    Cardiomyopathy Father    Lymphoma Father    Obesity Brother    Obesity Brother    Obesity Brother    Hypertension Maternal Grandmother    Hypertension Maternal Grandfather    Healthy Daughter     Social History   Socioeconomic History   Marital status: Married    Spouse name: Macaiah Mangal   Number of children: Not on file   Years of education: Not on file   Highest education level: Not on file  Occupational History   Not on file  Tobacco Use   Smoking status: Former    Packs/day: 0.50    Years: 13.00    Total pack years: 6.50    Types: Cigarettes    Quit date: 01/2013    Years since quitting: 9.7    Passive exposure: Past   Smokeless tobacco: Never  Vaping Use   Vaping Use: Former   Quit date: 09/20/2018  Substance and Sexual Activity   Alcohol use: Not Currently   Drug use: Never   Sexual activity: Yes    Birth  control/protection: Surgical    Comment: tubal  Other Topics Concern   Not on file  Social History Narrative   Not on file   Social Determinants of Health   Financial Resource Strain: Low Risk  (12/10/2021)   Overall Financial Resource Strain (CARDIA)    Difficulty of Paying Living Expenses: Not hard at all  Food Insecurity: No Food Insecurity (12/10/2021)   Hunger Vital Sign    Worried About Running Out of Food in the Last Year: Never true    [place] in the Last Year: Never true  Transportation Needs: No Transportation Needs (12/10/2021)   PRAPARE - Hydrologist (Medical): No    Lack of Transportation (Non-Medical): No  Physical Activity: Insufficiently Active (12/10/2021)   Exercise Vital Sign    Days of Exercise per Week: 2 days    Minutes of Exercise per Session: 20 min  Stress: No Stress Concern Present (12/10/2021)   [place]    Feeling of Stress : Only a little  Social Connections: Moderately Integrated (12/10/2021)   Social Connection and Isolation Panel [NHANES]  Frequency of Communication with Friends and Family: More than three times a week    Frequency of Social Gatherings with Friends and Family: More than three times a week    Attends Religious Services: More than 4 times per year    Active Member of [employer] or Organizations: No    Attends Archivist Meetings: Never    Marital Status: Married  Human resources officer Violence: Not At Risk (12/10/2021)   Humiliation, Afraid, Rape, and Kick questionnaire    Fear of Current or Ex-Partner: No    Emotionally Abused: No    Physically Abused: No    Sexually Abused: No    Outpatient Medications Prior to Visit  Medication Sig Dispense Refill   acetaminophen-codeine (TYLENOL #4) 300-60 MG tablet TAKE 1 TABLET BY MOUTH TWICE DAILY AS NEEDED FOR MODERATE PAIN 60 tablet 2   albuterol (VENTOLIN HFA) 108 (90 Base) MCG/ACT  inhaler Inhale 2 puffs into the lungs every 6 (six) hours as needed for wheezing or shortness of breath. 8 g 2   Certolizumab Pegol (CIMZIA STARTER KIT) 6 X 200 MG/ML PSKT Inject '400mg'$  into the skin at Week 0, Week 2, and Week 4. 1 each 0   diclofenac (VOLTAREN) 50 MG EC tablet Take 1 tablet (50 mg total) by mouth 2 (two) times daily. (Patient taking differently: Take 50 mg by mouth daily.) 60 tablet 3   Drospirenone (SLYND) 4 MG TABS Take 1 tablet by mouth daily. 84 tablet 4   gabapentin (NEURONTIN) 300 MG capsule TAKE 1 CAPSULE(300 MG) BY MOUTH THREE TIMES DAILY 90 capsule 1   ondansetron (ZOFRAN) 4 MG tablet Take 1 tablet (4 mg total) by mouth every 8 (eight) hours as needed for nausea or vomiting. 20 tablet 0   sertraline (ZOLOFT) 100 MG tablet TAKE 2 TABLETS(200 MG) BY MOUTH DAILY 180 tablet 1   traZODone (DESYREL) 150 MG tablet TAKE 1 TABLET(150 MG) BY MOUTH AT BEDTIME AS NEEDED FOR SLEEP 90 tablet 0   UNABLE TO FIND MMR x 2 Dx: Z27.4 1 each 0   VITAMIN D PO Take by mouth. Once a day     Vitamin D, Ergocalciferol, (DRISDOL) 1.25 MG (50000 UNIT) CAPS capsule Take 1 capsule (50,000 Units total) by mouth every 7 (seven) days. 10 capsule 2   No facility-administered medications prior to visit.    Allergies  Allergen Reactions   Latex Rash    ROS Review of Systems  Constitutional:  Negative for chills and fever.  Eyes:  Negative for visual disturbance.  Respiratory:  Negative for chest tightness and shortness of breath.   Neurological:  Negative for dizziness and headaches.      Objective:    Physical Exam Constitutional:      Appearance: She is obese.  HENT:     Head: Normocephalic.     Mouth/Throat:     Mouth: Mucous membranes are moist.  Cardiovascular:     Rate and Rhythm: Normal rate.     Heart sounds: Normal heart sounds.  Pulmonary:     Effort: Pulmonary effort is normal.     Breath sounds: Normal breath sounds.  Neurological:     Mental Status: She is alert.      BP 125/82   Pulse 95   Ht '5\' 5"'$  (1.651 m)   Wt 174 lb (78.9 kg)   SpO2 98%   BMI 28.96 kg/m  Wt Readings from Last 3 Encounters:  10/12/22 174 lb (78.9 kg)  09/28/22 175 lb (79.4  kg)  09/16/22 173 lb (78.5 kg)    Lab Results  Component Value Date   TSH 1.400 09/21/2022   Lab Results  Component Value Date   WBC 5.9 09/21/2022   HGB 12.5 09/21/2022   HCT 39.2 09/21/2022   MCV 86 09/21/2022   PLT 235 09/21/2022   Lab Results  Component Value Date   NA 145 (H) 09/21/2022   K 4.6 09/21/2022   CO2 20 09/21/2022   GLUCOSE 100 (H) 09/21/2022   BUN 12 09/21/2022   CREATININE 0.82 09/21/2022   BILITOT 0.3 09/21/2022   ALKPHOS 89 09/21/2022   AST 27 09/21/2022   ALT 36 (H) 09/21/2022   PROT 6.9 09/21/2022   ALBUMIN 4.3 09/21/2022   CALCIUM 8.8 09/21/2022   ANIONGAP 7 11/16/2020   EGFR 93 09/21/2022   Lab Results  Component Value Date   CHOL 174 09/21/2022   Lab Results  Component Value Date   HDL 43 09/21/2022   Lab Results  Component Value Date   LDLCALC 106 (H) 09/21/2022   Lab Results  Component Value Date   TRIG 143 09/21/2022   Lab Results  Component Value Date   CHOLHDL 4.0 09/21/2022   Lab Results  Component Value Date   HGBA1C 6.0 (H) 09/21/2022      Assessment & Plan:  Overweight (BMI 25.0-29.9) Assessment & Plan: The patient has lost 1 pound since her last visit She reports starting weight watchers 2 weeks ago Reports family history of diabetes Encouraged to implement a heart healthy diet with increased physical activity Educational to follow: Eat three meals per day at times discussed. Cut out all diet bevergages and drink only water Eat whole food plant based meals Cut out junk food, fast food and processed foods Exercise 150 minutes a week Lose 1-2 lbs per week. Keep a food journal Choose foods that grow in a garden or in a fruit orchard and protein of animals with fins or feathers.  Lifestyle Medicine   - Whole Food,  Plant Predominant Nutrition is highly recommended: Eat Plenty of vegetables, Mushrooms, fruits, Legumes, Whole Grains, Nuts, seeds in lieu of processed meats, processed snacks/pastries red meat, poultry, eggs.    -It is better to avoid simple carbohydrates including: Cakes, Sweet Desserts, Ice Cream, Soda (diet and regular), Sweet Tea, Candies, Chips, Cookies, Store Bought Juices, Alcohol in Excess of  1-2 drinks a day, Lemonade,  Artificial Sweeteners, Doughnuts, Coffee Creamers, "Sugar-free" Products, etc, etc.  This is not a complete list.....   Exercise: If you are able: 30 -60 minutes a day ,4 days a week, or 150 minutes a week.  The longer the better.  Combine stretch, strength, and aerobic activities.  If you were told in the past that you have high risk for cardiovascular diseases, you may seek evaluation by your heart doctor prior to initiating moderate to intense exercise programs.        Follow-up: Return in about 3 months (around 01/11/2023).   Alvira Monday, FNP

## 2022-10-18 NOTE — Telephone Encounter (Signed)
ATC patient to determine if she has received Cimzia. Phone went straight to VM. Left VM requesting return call for update  Knox Saliva, PharmD, MPH, BCPS, CPP Clinical Pharmacist (Rheumatology and Pulmonology)

## 2022-10-19 DIAGNOSIS — R739 Hyperglycemia, unspecified: Secondary | ICD-10-CM | POA: Insufficient documentation

## 2022-10-19 NOTE — Telephone Encounter (Addendum)
Called [facility] for status update on Cimzia.  Per rep, they need to confirm address. Patient will have to call to confirm address. Rep confirmed that what they have on file is different from what we have on file. Spoke with patient. She will plan to call tomorrow to set up shipment. Advised her to call me back once she schedule shipment so we can plan new start appt around it.  Knox Saliva, PharmD, MPH, BCPS, CPP Clinical Pharmacist (Rheumatology and Pulmonology)

## 2022-10-19 NOTE — Telephone Encounter (Signed)
Patient returned your call and states she has not received the medication.

## 2022-10-20 NOTE — Telephone Encounter (Signed)
Patient is ready to schedule first injection w/sample. Patient said she called her pharmacy.

## 2022-10-21 NOTE — Telephone Encounter (Signed)
Returned call to patient regarding Cimzia new start. Unable to reach. Left VM advising that she does not need to speak with me directly to do so.  She can be scheduled for Cimzia new start visit at the soonest, 2 weeks before she receives shipment at home. (Example: if the pharmacy has told her it will arrive to her home on 11/11/2022, she can be scheduled for Cimzia new start on 10/28/2022 at the earliest)  Knox Saliva, PharmD, MPH, BCPS, CPP Clinical Pharmacist (Rheumatology and Pulmonology)

## 2022-10-22 ENCOUNTER — Telehealth: Payer: Self-pay | Admitting: *Deleted

## 2022-10-22 NOTE — Telephone Encounter (Signed)
Patient called, paitent received her medication in the mail and is ready to schedule her appt. Thank you.

## 2022-10-25 NOTE — Telephone Encounter (Signed)
Left VM for pt to schedule Cimzi new start. If she returns call, please schedule her for pharmacy appt. She has never picked up my phone calls during the mornings and I am unable to call her in the afternoons.  Knox Saliva, PharmD, MPH, BCPS, CPP Clinical Pharmacist (Rheumatology and Pulmonology)

## 2022-10-27 NOTE — Telephone Encounter (Signed)
Patient is scheduled for Cimzia new start on 10/28/2022  Knox Saliva, PharmD, MPH, BCPS, CPP Clinical Pharmacist (Rheumatology and Pulmonology)

## 2022-10-28 ENCOUNTER — Ambulatory Visit: Attending: Internal Medicine | Admitting: Pharmacist

## 2022-10-28 DIAGNOSIS — Z79899 Other long term (current) drug therapy: Secondary | ICD-10-CM

## 2022-10-28 DIAGNOSIS — M45A7 Non-radiographic axial spondyloarthritis of lumbosacral region: Secondary | ICD-10-CM

## 2022-10-28 DIAGNOSIS — Z7189 Other specified counseling: Secondary | ICD-10-CM

## 2022-10-28 MED ORDER — CIMZIA 2 X 200 MG/ML ~~LOC~~ PSKT: 400.0000 mg | PREFILLED_SYRINGE | SUBCUTANEOUS | 2 refills | Status: DC

## 2022-10-28 NOTE — Patient Instructions (Signed)
Your next CIMZIA dose is due on 11/11/2022, 11/25/2022, and every 4 weeks thereafter (starting on 12/23/2022). It will always be TWO SYRINGES (use different injection sites)  CONTINUE diclofenac and acetaminophen  HOLD CIMZIA if you have signs or symptoms of an infection. You can resume once you feel better or back to your baseline. HOLD CIMZIA if you start antibiotics to treat an infection. HOLD CIMZIA around the time of surgery/procedures. Your surgeon will be able to provide recommendations on when to hold BEFORE and when you are cleared to [place].  Pharmacy information: Your prescription will be shipped from [facility]. Their phone number is [phone]  Please call to schedule shipment and confirm address. They will mail your medication to your home.  Labs are due in 1 month then every 3 months. Lab hours are from Monday to Thursday 8am-12:30pm and 1pm-5pm and Friday 8am-12pm. You do not need an appointment if you come for labs during these times.  How to manage an injection site reaction: Remember the 5 C's: COUNTER - leave on the counter at least 30 minutes but up to overnight to bring medication to room temperature. This may help prevent stinging COLD - place something cold (like an ice gel pack or cold water bottle) on the injection site just before cleansing with alcohol. This may help reduce pain CLARITIN - use Claritin (generic name is loratadine) for the first two weeks of treatment or the day of, the day before, and the day after injecting. This will help to minimize injection site reactions CORTISONE CREAM - apply if injection site is irritated and itching CALL ME - if injection site reaction is bigger than the size of your fist, looks infected, blisters, or if you develop hives

## 2022-10-28 NOTE — Progress Notes (Signed)
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 Patient currently takes diclofenac (inadequate response) and acetaminophen - both once daily.  She has never self-administered injectables but does not have any needle-phobia. Reports her husband is Equities trader as well and able to administer injectables  She received three boxes of 2x'200mg'$  Cimzia PFS from [facility] (not hte starter kit). Patient is carrying one box today in cooler bag.  Patient running a fever or have signs/symptoms of infection? No  Patient currently on antibiotics for the treatment of infection? No  Patient have any upcoming invasive procedures/surgeries? No  Objective: CMP     Component Value Date/Time   NA 145 (H) 09/21/2022 1045   K 4.6 09/21/2022 1045   CL 107 (H) 09/21/2022 1045   CO2 20 09/21/2022 1045   GLUCOSE 100 (H) 09/21/2022 1045   GLUCOSE 78 11/16/2020 1330   BUN 12 09/21/2022 1045   CREATININE 0.82 09/21/2022 1045   CALCIUM 8.8 09/21/2022 1045   PROT 6.9 09/21/2022 1045   ALBUMIN 4.3 09/21/2022 1045   AST 27 09/21/2022 1045   ALT 36 (H) 09/21/2022 1045   ALKPHOS 89 09/21/2022 1045   BILITOT 0.3 09/21/2022 1045   GFRNONAA >60 11/16/2020 1330   GFRAA 97 09/24/2020 1135    CBC    Component Value Date/Time   WBC 5.9 09/21/2022 1045   WBC 8.4 11/16/2020 1330   RBC 4.56 09/21/2022 1045   RBC 4.13 11/16/2020 1330   HGB 12.5 09/21/2022 1045   HCT 39.2 09/21/2022 1045   PLT 235 09/21/2022 1045   MCV 86 09/21/2022 1045   MCH 27.4 09/21/2022 1045   MCH 28.3 11/16/2020 1330   MCHC 31.9 09/21/2022 1045   MCHC 32.1 11/16/2020 1330   RDW 12.9 09/21/2022 1045   LYMPHSABS 2.2 09/21/2022 1045   MONOABS 0.6 11/16/2020 1330   EOSABS 0.2 09/21/2022 1045   BASOSABS 0.0 09/21/2022 1045    Baseline Immunosuppressant Therapy Labs TB GOLD    Latest Ref Rng & Units 09/28/2022   11:41 AM  Quantiferon TB  Gold  Quantiferon TB Gold Plus NEGATIVE NEGATIVE    Hepatitis Panel    Latest Ref Rng & Units 09/28/2022   11:41 AM  Hepatitis  Hep C Ab NON-REACTIVE NON-REACTIVE    HIV Lab Results  Component Value Date   HIV NON-REACTIVE 09/28/2022   HIV Non Reactive 04/24/2020   HIV Non Reactive 01/31/2020   Immunoglobulins   SPEP    Latest Ref Rng & Units 09/21/2022   10:45 AM  Serum Protein Electrophoresis  Total Protein 6.0 - 8.5 g/dL 6.9    G6PD No results found for: "G6PDH" TPMT No results found for: "TPMT"   Chest x-ray: 09/28/2022 - No active cardiopulmonary disease.  Assessment/Plan:  Counseled patient on purpose, proper use, and adverse effects of Cimzia.  Reviewed the most common adverse effects including infections and injection site reactions. Discussed that there is the possibility of an increased risk of malignancy including non-melanoma skin cancer but it is not well understood if this increased risk is due to the medication or the disease state.  Advised patient to get yearly dermatology exams due to risk of skin cancer.  Counseled patient that Cimzia should be held prior to scheduled surgery.   Reviewed the importance of regular labs while on Cimzia therapy. Will monitor CBC and CMP 1 month after starting and then every 3 months routinely thereafter.  Will monitor TB gold annually. Patient voiced understanding.  Reviewed storage instructions for Cimzia.    Reviewed importance of holding [place] with signs/symptoms of an infections, if antibiotics are prescribed to treat an active infection, and with invasive procedures  Demonstrated proper injection technique with CIMZIA demo device  Patient able to demonstrate proper injection technique using the teach back method.  Patient self injected in the right lower abdomen and left lower abdomen with:  Pharmacy-supplied Medication: Cimzia '200mg'$ /ml x 2 syringe kit = '400mg'$  total NDC: [address]-18 Lot: [id number] Expiration:  02/2024  Patient tolerated well.  Observed for 30 mins in office for adverse reaction and none noted. Patient denies itchiness and irritation at injection site.   Patient is to return in 1 month for labs and 6-8 weeks for follow-up appointment.  Standing orders placed for CBC and CMP. Referral to [facility] placed for yearly skin checks while on TNF inhibitor due to risk for non-melanoma skin cancer.  CIMZIA does not require authorization through [employer] coverage.   Rx sent to:  [facility] .  Patient has received shipment at home  Patient will continue CIMZIA '400mg'$  subcut at Week 0 (administered in clinic today), Week 2, Week 4 then '400mg'$  subcut every 4 weeks thereafter. She will continue diclofenac as prescribed.  All questions encouraged and answered.  Instructed patient to call with any further questions or concerns.  Knox Saliva, PharmD, MPH, BCPS, CPP Clinical Pharmacist (Rheumatology and Pulmonology)  10/28/2022 8:42 AM

## 2022-11-09 ENCOUNTER — Ambulatory Visit: Admitting: Internal Medicine

## 2022-11-16 ENCOUNTER — Other Ambulatory Visit: Payer: Self-pay | Admitting: Family Medicine

## 2022-11-16 DIAGNOSIS — G4700 Insomnia, unspecified: Secondary | ICD-10-CM

## 2022-11-17 ENCOUNTER — Other Ambulatory Visit: Payer: Self-pay | Admitting: Physical Medicine & Rehabilitation

## 2022-12-01 ENCOUNTER — Other Ambulatory Visit: Payer: Self-pay | Admitting: Physical Medicine & Rehabilitation

## 2022-12-05 ENCOUNTER — Encounter: Payer: Self-pay | Admitting: Physical Medicine & Rehabilitation
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
## 2022-12-07 ENCOUNTER — Encounter: Payer: Self-pay | Admitting: Internal Medicine

## 2022-12-07 ENCOUNTER — Ambulatory Visit: Attending: Internal Medicine | Admitting: Internal Medicine

## 2022-12-07 VITALS — BP 111/71 | HR 76 | Resp 12 | Ht 65.0 in | Wt 178.0 lb

## 2022-12-07 DIAGNOSIS — R768 Other specified abnormal immunological findings in serum: Secondary | ICD-10-CM

## 2022-12-07 DIAGNOSIS — Z79899 Other long term (current) drug therapy: Secondary | ICD-10-CM

## 2022-12-07 DIAGNOSIS — M461 Sacroiliitis, not elsewhere classified: Secondary | ICD-10-CM | POA: Diagnosis not present

## 2022-12-07 DIAGNOSIS — M45A7 Non-radiographic axial spondyloarthritis of lumbosacral region: Secondary | ICD-10-CM | POA: Diagnosis not present

## 2022-12-07 DIAGNOSIS — M5137 Other intervertebral disc degeneration, lumbosacral region: Secondary | ICD-10-CM | POA: Diagnosis not present

## 2022-12-08 LAB — COMPLETE METABOLIC PANEL WITH GFR
AG Ratio: 1.4 (calc) (ref 1.0–2.5)
ALT: 21 U/L (ref 6–29)
AST: 18 U/L (ref 10–30)
Albumin: 4.2 g/dL (ref 3.6–5.1)
Alkaline phosphatase (APISO): 60 U/L (ref 31–125)
BUN: 12 mg/dL (ref 7–25)
CO2: 27 mmol/L (ref 20–32)
Calcium: 9.2 mg/dL (ref 8.6–10.2)
Chloride: 107 mmol/L (ref 98–110)
Creat: 0.67 mg/dL (ref 0.50–0.97)
Globulin: 3 g/dL (calc) (ref 1.9–3.7)
Glucose, Bld: 89 mg/dL (ref 65–99)
Potassium: 4.7 mmol/L (ref 3.5–5.3)
Sodium: 140 mmol/L (ref 135–146)
Total Bilirubin: 0.5 mg/dL (ref 0.2–1.2)
Total Protein: 7.2 g/dL (ref 6.1–8.1)
eGFR: 114 mL/min/{1.73_m2} (ref >=60)

## 2022-12-08 LAB — CBC WITH DIFFERENTIAL/PLATELET
Absolute Monocytes: 661 cells/uL (ref 200–950)
Basophils Absolute: 53 cells/uL (ref 0–200)
Basophils Relative: 0.7 %
Eosinophils Absolute: 167 cells/uL (ref 15–500)
Eosinophils Relative: 2.2 %
HCT: 38.5 % (ref 35.0–45.0)
Hemoglobin: 12.6 g/dL (ref 11.7–15.5)
Lymphs Abs: 2364 cells/uL (ref 850–3900)
MCH: 27.8 pg (ref 27.0–33.0)
MCHC: 32.7 g/dL (ref 32.0–36.0)
MCV: 84.8 fL (ref 80.0–100.0)
MPV: 10.4 fL (ref 7.5–12.5)
Monocytes Relative: 8.7 %
Neutro Abs: 4355 cells/uL (ref 1500–7800)
Neutrophils Relative %: 57.3 %
Platelets: 302 10*3/uL (ref 140–400)
RBC: 4.54 10*6/uL (ref 3.80–5.10)
RDW: 13.1 % (ref 11.0–15.0)
Total Lymphocyte: 31.1 %
WBC: 7.6 10*3/uL (ref 3.8–10.8)

## 2022-12-08 LAB — SEDIMENTATION RATE: Sed Rate: 22 mm/h — ABNORMAL HIGH (ref 0–20)

## 2022-12-08 NOTE — Progress Notes (Signed)
Sed rate decreased to 22 from 41 may be some response to the cimzia especially as this is while off diclofenac. Hopefully she sees additional benefit in symptoms with more time. CBC and CMP are normal no significant side effect from starting the medication.

## 2022-12-14 ENCOUNTER — Encounter: Payer: Self-pay | Admitting: Adult Health

## 2022-12-14 ENCOUNTER — Other Ambulatory Visit (HOSPITAL_COMMUNITY)
Admission: RE | Admit: 2022-12-14 | Discharge: 2022-12-14 | Disposition: A | Discharge status: Home / Self Care | Source: Ambulatory Visit | Attending: Adult Health | Admitting: Adult Health

## 2022-12-14 ENCOUNTER — Ambulatory Visit (INDEPENDENT_AMBULATORY_CARE_PROVIDER_SITE_OTHER): Admitting: Adult Health

## 2022-12-14 VITALS — BP 114/74 | HR 76 | Ht 65.0 in | Wt 173.0 lb

## 2022-12-14 DIAGNOSIS — N644 Mastodynia: Secondary | ICD-10-CM | POA: Diagnosis not present

## 2022-12-14 DIAGNOSIS — N9489 Other specified conditions associated with female genital organs and menstrual cycle: Secondary | ICD-10-CM

## 2022-12-14 DIAGNOSIS — Z01419 Encounter for gynecological examination (general) (routine) without abnormal findings: Secondary | ICD-10-CM | POA: Diagnosis not present

## 2022-12-14 DIAGNOSIS — N920 Excessive and frequent menstruation with regular cycle: Secondary | ICD-10-CM | POA: Diagnosis not present

## 2022-12-14 MED ORDER — SLYND 4 MG PO TABS: 1.0000 | ORAL_TABLET | Freq: Every day | ORAL | 4 refills | Status: DC

## 2022-12-14 NOTE — Progress Notes (Signed)
Patient ID: Gabriella Schmidt, female   DOB: 09/12/1981, 42 y.o.   MRN: [id number] History of Present Illness: Gabriella Schmidt is a 42 year old white female, married, G1P0101, in for a well woman gyn exam and pap. She is not having a period on slynd, but has mild cramps. Has bilateral breast pain still, but wonders if related to inflammation she has, is on Cimzia now.  PCP is Alvira Monday NP   Current Medications, Allergies, Past Medical History, Past Surgical History, Family History and Social History were reviewed in [employer] record.     Review of Systems: Patient denies any headaches, hearing loss, fatigue, blurred vision, shortness of breath, chest pain, abdominal pain, problems with bowel movements, urination, or intercourse. No joint pain or mood swings.  See HPI for positives.    Physical Exam:BP 114/74 (BP Location: Left Arm, Patient Position: Sitting, Cuff Size: Normal)   Pulse 76   Ht 5\' 5"  (1.651 m)   Wt 173 lb (78.5 kg)   BMI 28.79 kg/m   General:  Well developed, well nourished, no acute distress Skin:  Warm and dry Neck:  Midline trachea, normal thyroid, good ROM, no lymphadenopathy Lungs; Clear to auscultation bilaterally Breast:  No dominant palpable mass, retraction, or nipple discharge, has tenderness and known fibroadenoma left breast at about 12 0'clock  Cardiovascular: Regular rate and rhythm Abdomen:  Soft, non tender, no hepatosplenomegaly Pelvic:  External genitalia is normal in appearance, no lesions.  The vagina is normal in appearance. Urethra has no lesions or masses. The cervix is bulbous.Pap with HR HPV genotyping performed.  Uterus is felt to be normal size, shape, and contour.  No adnexal masses or tenderness noted.Bladder is non tender, no masses felt. Extremities/musculoskeletal:  No swelling or varicosities noted, no clubbing or cyanosis Psych:  No mood changes, alert and cooperative,seems happy AA is 0 Fall risk is low     12/14/2022    1:48 PM 12/14/2022    1:41 PM 10/12/2022    2:52 PM  Depression screen PHQ 2/9  Decreased Interest 0 0 0  Down, Depressed, Hopeless 0 0 0  PHQ - 2 Score 0 0 0  Altered sleeping 2 2 2   Tired, decreased energy 2 2 1   Change in appetite 0 0 0  Feeling bad or failure about yourself  0 0 0  Trouble concentrating 1 1 1   Moving slowly or fidgety/restless 0 0 0  Suicidal thoughts 0 0 0  PHQ-9 Score 5 5 4   Difficult doing work/chores  Somewhat difficult Somewhat difficult       12/14/2022    1:43 PM 10/12/2022    2:52 PM 09/08/2022   10:22 AM 12/10/2021   10:15 AM  GAD 7 : Generalized Anxiety Score  Nervous, Anxious, on Edge 2 2 3 1   Control/stop worrying 0 0 0 0  Worry too much - different things 0 0 2 1  Trouble relaxing 2 2 0 0  Restless 1 0 0 0  Easily annoyed or irritable 2 0 0 0  Afraid - awful might happen 0 0 0 0  Total GAD 7 Score 7 4 5 2   Anxiety Difficulty Somewhat difficult Somewhat difficult Not difficult at all       Upstream - 12/14/22 1341       Pregnancy Intention Screening   Does the patient want to become pregnant in the next year? No    Does the patient's partner want to become pregnant in the  next year? No    Would the patient like to discuss contraceptive options today? No      Contraception Wrap Up   Current Method Female Sterilization    End Method Female Sterilization            Examination chaperoned by Levy Pupa LPN   Impression and Plan: 1. Encounter for gynecological examination with Papanicolaou smear of cervix Pap sent Physical in 1 year - Cytology - PAP( [place])  2. Pain of both breasts Call for mammogram   3. Uterine cramping Better on Slynd  4. Menorrhagia with regular cycle Has resolved on Slynd Meds ordered this encounter  Medications   Drospirenone (SLYND) 4 MG TABS    Sig: Take 1 tablet (4 mg total) by mouth daily.    Dispense:  84 tablet    Refill:  4    Order Specific Question:   Supervising  Provider    Answer:   Tania Ade H [[id number]]

## 2022-12-16 LAB — CYTOLOGY - PAP
Comment: NEGATIVE
Diagnosis: NEGATIVE
High risk HPV: NEGATIVE

## 2022-12-17 ENCOUNTER — Other Ambulatory Visit (HOSPITAL_COMMUNITY): Payer: Self-pay | Admitting: Adult Health

## 2022-12-17 DIAGNOSIS — Z1231 Encounter for screening mammogram for malignant neoplasm of breast: Secondary | ICD-10-CM

## 2022-12-21 ENCOUNTER — Encounter: Payer: Self-pay | Admitting: Physical Medicine & Rehabilitation

## 2022-12-21 ENCOUNTER — Encounter: Attending: Physical Medicine & Rehabilitation | Admitting: Physical Medicine & Rehabilitation

## 2022-12-21 VITALS — BP 115/77 | HR 79 | Ht 65.0 in | Wt 175.0 lb

## 2022-12-21 DIAGNOSIS — G894 Chronic pain syndrome: Secondary | ICD-10-CM | POA: Diagnosis not present

## 2022-12-21 MED ORDER — ACETAMINOPHEN-CODEINE 300-60 MG PO TABS: 1.0000 | ORAL_TABLET | Freq: Every day | ORAL | 2 refills | Status: DC

## 2022-12-21 MED ORDER — DICLOFENAC SODIUM 50 MG PO TBEC: 50.0000 mg | DELAYED_RELEASE_TABLET | Freq: Every evening | ORAL | 3 refills | Status: DC | PRN

## 2022-12-21 NOTE — Progress Notes (Signed)
Subjective:    Patient ID: Gabriella Schmidt, female    DOB: [birth year], 42 y.o.   MRN: [id number]  HPI  Started on Cimzia by Rheumatology in Feb 2024, for the diagnosis of axial spondylarthritis and was told it was going to take up to 6 months for full effect The patient has positive ANA titer at 1: 40, hs-CRP greater than 10, sed rate initially 40 down to 22 posttreatment  Still taking diclofenac 50mg  qhs (takes at night ) and is out  Tylenol #3 once a day on average and is out of meds  Functionally modified independent going to school full-time as well as helping taking care of of her child.  Husband had recent shoulder surgery and she has been having to do more around the house. No falls or new injuries. Pain Inventory Average Pain 7 Pain Right Now 8 My pain is sharp, stabbing, and aching  In the last 24 hours, has pain interfered with the following? General activity 5 Relation with others 3 Enjoyment of life 5 What TIME of day is your pain at its worst? daytime and evening Sleep (in general) Fair  Pain is worse with: walking, bending, sitting, inactivity, standing, and some activites Pain improves with: heat/ice and medication Relief from Meds: 7  Family History  Problem Relation Age of Onset   Hypertension Mother    Skin cancer Mother    Hypertension Father    Cardiomyopathy Father    Lymphoma Father    Obesity Brother    Obesity Brother    Obesity Brother    Hypertension Maternal Grandmother    Hypertension Maternal Grandfather    Healthy Daughter    Social History   Socioeconomic History   Marital status: Married    Spouse name: Latisha Welton   Number of children: 1   Years of education: Not on file   Highest education level: Not on file  Occupational History   Not on file  Tobacco Use   Smoking status: Former    Packs/day: 0.50    Years: 13.00    Additional pack years: 0.00    Total pack years: 6.50    Types: Cigarettes    Quit date: 01/2013     Years since quitting: 9.9    Passive exposure: Past   Smokeless tobacco: Never  Vaping Use   Vaping Use: Former   Quit date: 09/20/2018  Substance and Sexual Activity   Alcohol use: Not Currently   Drug use: Never   Sexual activity: Yes    Birth control/protection: Surgical    Comment: tubal// vasectomy  Other Topics Concern   Not on file  Social History Narrative   Not on file   Social Determinants of Health   Financial Resource Strain: Low Risk  (12/14/2022)   Overall Financial Resource Strain (CARDIA)    Difficulty of Paying Living Expenses: Not hard at all  Food Insecurity: No Food Insecurity (12/14/2022)   Hunger Vital Sign    Worried About Running Out of Food in the Last Year: Never true    [place] in the Last Year: Never true  Transportation Needs: No Transportation Needs (12/14/2022)   PRAPARE - Hydrologist (Medical): No    Lack of Transportation (Non-Medical): No  Physical Activity: Insufficiently Active (12/14/2022)   Exercise Vital Sign    Days of Exercise per Week: 2 days    Minutes of Exercise per Session: 30 min  Stress: Stress Concern  Present (12/14/2022)   [place]    Feeling of Stress : To some extent  Social Connections: Moderately Integrated (12/14/2022)   Social Connection and Isolation Panel [NHANES]    Frequency of Communication with Friends and Family: More than three times a week    Frequency of Social Gatherings with Friends and Family: Twice a week    Attends Religious Services: More than 4 times per year    Active Member of [employer] or Organizations: No    Attends Archivist Meetings: Never    Marital Status: Married   Past Surgical History:  Procedure Laterality Date   TOOTH EXTRACTION     TUBAL LIGATION N/A 06/29/2020   Procedure: POST PARTUM TUBAL LIGATION;  Surgeon: Osborne Oman, MD;  Location: [facility];  Service: Gynecology;   Laterality: N/A;   Past Surgical History:  Procedure Laterality Date   TOOTH EXTRACTION     TUBAL LIGATION N/A 06/29/2020   Procedure: POST PARTUM TUBAL LIGATION;  Surgeon: Osborne Oman, MD;  Location: [facility];  Service: Gynecology;  Laterality: N/A;   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   Hypertension    Pre-diabetes    BP 115/77   Pulse 79   Ht 5\' 5"  (1.651 m)   Wt 175 lb (79.4 kg)   SpO2 97%   BMI 29.12 kg/m   Opioid Risk Score:   Fall Risk Score:  `1  Depression screen [facility] 2/9     12/21/2022   11:23 AM 12/14/2022    1:48 PM 12/14/2022    1:41 PM 10/12/2022    2:52 PM 09/08/2022   10:22 AM 05/26/2022    3:22 PM 03/18/2022   11:54 AM  Depression screen PHQ 2/9  Decreased Interest 0 0 0 0 0 0 0  Down, Depressed, Hopeless 0 0 0 0 1 0 0  PHQ - 2 Score 0 0 0 0 1 0 0  Altered sleeping  2 2 2 2     Tired, decreased energy  2 2 1  0    Change in appetite  0 0 0 3    Feeling bad or failure about yourself   0 0 0 0    Trouble concentrating  1 1 1  0    Moving slowly or fidgety/restless  0 0 0 0    Suicidal thoughts  0 0 0 0    PHQ-9 Score  5 5 4 6     Difficult doing work/chores   Somewhat difficult Somewhat difficult Not difficult at all        Review of Systems  Musculoskeletal:  Positive for back pain and gait problem.       Right shoulder pain B/L knee pain   All other systems reviewed and are negative.     Objective:   Physical Exam  General no acute distress Mood and affect are appropriate  Sacral thrust (prone) : Negative  FABER's: Positive on left Distraction (supine): Negative Thigh thrust test: Positive on left Motor strength is 5/5 bilateral hip flexor knee extensor ankle dorsiflexor Negative straight leg raising test  bilaterally Ambulates without assistive device no evidence of toe  drag and instability Lumbar spine has tenderness palpation along the entire lumbar paraspinal area also some mild tenderness at the lumbosacral junction as well as the thoracolumbar junction.  There is no pain in the cervical paraspinal or thoracic paraspinal area. Lumbar range of motion is reduced to approximate 25% flexion and extension as well as lateral bending.      Assessment & Plan:  #1.  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  According to the patient rheumatology expects that it may take a total of 6 months prior to full effect.  Would like to re eval pt in 4 more months Cont Diclofenac 50mg  qhs to help with nocturnal pain Cont T#4 one per day  No need for spine injection at this time  May need PT if ROM does not improve with treatment

## 2022-12-22 ENCOUNTER — Ambulatory Visit (HOSPITAL_COMMUNITY)
Admission: RE | Admit: 2022-12-22 | Discharge: 2022-12-22 | Disposition: A | Discharge status: Home / Self Care | Source: Ambulatory Visit | Attending: Adult Health | Admitting: Adult Health

## 2022-12-22 DIAGNOSIS — Z1231 Encounter for screening mammogram for malignant neoplasm of breast: Secondary | ICD-10-CM | POA: Diagnosis not present

## 2023-01-08 ENCOUNTER — Encounter: Payer: Self-pay | Admitting: Physical Medicine & Rehabilitation

## 2023-01-10 MED ORDER — DICLOFENAC SODIUM 50 MG PO TBEC: 50.0000 mg | DELAYED_RELEASE_TABLET | Freq: Two times a day (BID) | ORAL | 0 refills | Status: DC

## 2023-01-10 MED ORDER — CYCLOBENZAPRINE HCL 5 MG PO TABS: 5.0000 mg | ORAL_TABLET | Freq: Every day | ORAL | 0 refills | Status: DC

## 2023-01-17 ENCOUNTER — Other Ambulatory Visit: Payer: Self-pay | Admitting: Physical Medicine & Rehabilitation

## 2023-01-18 ENCOUNTER — Other Ambulatory Visit: Payer: Self-pay | Admitting: Adult Health

## 2023-01-18 ENCOUNTER — Ambulatory Visit (INDEPENDENT_AMBULATORY_CARE_PROVIDER_SITE_OTHER): Admitting: Family Medicine

## 2023-01-18 ENCOUNTER — Encounter: Payer: Self-pay | Admitting: Family Medicine

## 2023-01-18 VITALS — BP 123/74 | HR 84 | Ht 65.0 in | Wt 177.1 lb

## 2023-01-18 DIAGNOSIS — E038 Other specified hypothyroidism: Secondary | ICD-10-CM | POA: Diagnosis not present

## 2023-01-18 DIAGNOSIS — R7301 Impaired fasting glucose: Secondary | ICD-10-CM | POA: Diagnosis not present

## 2023-01-18 DIAGNOSIS — E559 Vitamin D deficiency, unspecified: Secondary | ICD-10-CM

## 2023-01-18 DIAGNOSIS — E663 Overweight: Secondary | ICD-10-CM

## 2023-01-18 DIAGNOSIS — E785 Hyperlipidemia, unspecified: Secondary | ICD-10-CM

## 2023-01-18 MED ORDER — SLYND 4 MG PO TABS: 1.0000 | ORAL_TABLET | Freq: Every day | ORAL | 4 refills | Status: DC

## 2023-01-18 NOTE — Progress Notes (Signed)
Established Patient Office Visit  Subjective:  Patient ID: Gabriella Schmidt, female    DOB: 1981/07/12  Age: 42 y.o. MRN: [id number]  CC:  Chief Complaint  Patient presents with   Obesity    3 month f/u, pt reports unable to lose weight, been doing weight watchers and is more active still not seeing a change.    HPI Gabriella Schmidt is a 42 y.o. female with past medical history of morbid obesity presents for f/u. For the details of today's visit, please refer to the assessment and plan.       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   Hypertension    Pre-diabetes     Past Surgical History:  Procedure Laterality Date   TOOTH EXTRACTION     TUBAL LIGATION N/A 06/29/2020   Procedure: POST PARTUM TUBAL LIGATION;  Surgeon: Tereso Newcomer, MD;  Location: [facility];  Service: Gynecology;  Laterality: N/A;    Family History  Problem Relation Age of Onset   Hypertension Mother    Skin cancer Mother    Hypertension Father    Cardiomyopathy Father    Lymphoma Father    Obesity Brother    Obesity Brother    Obesity Brother    Hypertension Maternal Grandmother    Hypertension Maternal Grandfather    Healthy Daughter     Social History   Socioeconomic History   Marital status: Married    Spouse name: Ercie Eliasen   Number of children: 1   Years of education: Not on file   Highest education level: Some college, no degree  Occupational History   Not on file  Tobacco Use   Smoking status: Former    Packs/day: 0.50    Years: 13.00    Additional pack years: 0.00    Total pack years: 6.50    Types: Cigarettes    Quit date: 01/2013    Years since quitting: 10.0    Passive exposure: Past   Smokeless tobacco: Never  Vaping Use   Vaping Use: Former    Quit date: 09/20/2018  Substance and Sexual Activity   Alcohol use: Not Currently   Drug use: Never   Sexual activity: Yes    Birth control/protection: Surgical    Comment: tubal// vasectomy  Other Topics Concern   Not on file  Social History Narrative   Not on file   Social Determinants of Health   Financial Resource Strain: Low Risk  (01/14/2023)   Overall Financial Resource Strain (CARDIA)    Difficulty of Paying Living Expenses: Not hard at all  Food Insecurity: No Food Insecurity (01/14/2023)   Hunger Vital Sign    Worried About Running Out of Food in the Last Year: Never true    Ran Out of Food in the Last Year: Never true  Transportation Needs: No Transportation Needs (01/14/2023)   PRAPARE - Administrator, Civil Service (Medical): No    Lack of Transportation (Non-Medical): No  Physical Activity: Insufficiently Active (01/14/2023)   Exercise Vital Sign    Days of Exercise per Week: 3 days    Minutes of Exercise per Session: 20 min  Stress: Stress Concern Present (01/14/2023)  [employer] of Occupational Health - Occupational Stress Questionnaire    Feeling of Stress : To some extent  Social Connections: Moderately Integrated (01/14/2023)   Social Connection and Isolation Panel [NHANES]    Frequency of Communication with Friends and Family: More than three times a week    Frequency of Social Gatherings with Friends and Family: Twice a week    Attends Religious Services: More than 4 times per year    Active Member of [employer] or Organizations: No    Attends Banker Meetings: Never    Marital Status: Married  Catering manager Violence: Not At Risk (12/14/2022)   Humiliation, Afraid, Rape, and Kick questionnaire    Fear of Current or Ex-Partner: No    Emotionally Abused: No    Physically Abused: No    Sexually Abused: No    Outpatient Medications Prior to Visit  Medication Sig Dispense Refill   acetaminophen-codeine (TYLENOL #4) 300-60 MG  tablet Take 1 tablet by mouth at bedtime. 30 tablet 2   albuterol (VENTOLIN HFA) 108 (90 Base) MCG/ACT inhaler Inhale 2 puffs into the lungs every 6 (six) hours as needed for wheezing or shortness of breath. 8 g 2   certolizumab pegol (CIMZIA) 2 X 200 MG/ML PSKT prefilled syringe Inject 400 mg into the skin every 28 (twenty-eight) days. 1 each 2   cyclobenzaprine (FLEXERIL) 5 MG tablet Take 1 tablet (5 mg total) by mouth at bedtime. 30 tablet 0   diclofenac (VOLTAREN) 50 MG EC tablet Take 1 tablet (50 mg total) by mouth 2 (two) times daily. 60 tablet 0   gabapentin (NEURONTIN) 300 MG capsule TAKE 1 CAPSULE(300 MG) BY MOUTH THREE TIMES DAILY 90 capsule 1   Multiple Vitamin (MULTIVITAMIN) tablet Take 1 tablet by mouth daily.     sertraline (ZOLOFT) 100 MG tablet TAKE 2 TABLETS(200 MG) BY MOUTH DAILY 180 tablet 1   traZODone (DESYREL) 150 MG tablet TAKE 1 TABLET(150 MG) BY MOUTH AT BEDTIME AS NEEDED FOR SLEEP 90 tablet 0   Vitamin D, Ergocalciferol, (DRISDOL) 1.25 MG (50000 UNIT) CAPS capsule Take 1 capsule (50,000 Units total) by mouth every 7 (seven) days. 10 capsule 2   Drospirenone (SLYND) 4 MG TABS Take 1 tablet (4 mg total) by mouth daily. 84 tablet 4   UNABLE TO FIND MMR x 2 Dx: Z27.4 1 each 0   No facility-administered medications prior to visit.    Allergies  Allergen Reactions   Latex Rash    ROS Review of Systems  Constitutional:  Negative for chills and fever.  Eyes:  Negative for visual disturbance.  Respiratory:  Negative for chest tightness and shortness of breath.   Neurological:  Negative for dizziness and headaches.      Objective:    Physical Exam HENT:     Head: Normocephalic.     Mouth/Throat:     Mouth: Mucous membranes are moist.  Cardiovascular:     Rate and Rhythm: Normal rate.     Heart sounds: Normal heart sounds.  Pulmonary:     Effort: Pulmonary effort is normal.     Breath sounds: Normal breath sounds.  Neurological:     Mental Status: She is  alert.     BP 123/74   Pulse 84   Ht 5\' 5"  (1.651 m)   Wt 177 lb 1.3 oz (80.3 kg)   SpO2 96%   BMI 29.47 kg/m  Wt Readings from Last 3 Encounters:  01/18/23 177 lb 1.3 oz (80.3 kg)  12/21/22 175 lb (79.4 kg)  12/14/22 173 lb (78.5 kg)    Lab Results  Component Value Date   TSH 1.400 09/21/2022   Lab Results  Component Value Date   WBC 7.6 12/07/2022   HGB 12.6 12/07/2022   HCT 38.5 12/07/2022   MCV 84.8 12/07/2022   PLT 302 12/07/2022   Lab Results  Component Value Date   NA 140 12/07/2022   K 4.7 12/07/2022   CO2 27 12/07/2022   GLUCOSE 89 12/07/2022   BUN 12 12/07/2022   CREATININE 0.67 12/07/2022   BILITOT 0.5 12/07/2022   ALKPHOS 89 09/21/2022   AST 18 12/07/2022   ALT 21 12/07/2022   PROT 7.2 12/07/2022   ALBUMIN 4.3 09/21/2022   CALCIUM 9.2 12/07/2022   ANIONGAP 7 11/16/2020   EGFR 114 12/07/2022   Lab Results  Component Value Date   CHOL 174 09/21/2022   Lab Results  Component Value Date   HDL 43 09/21/2022   Lab Results  Component Value Date   LDLCALC 106 (H) 09/21/2022   Lab Results  Component Value Date   TRIG 143 09/21/2022   Lab Results  Component Value Date   CHOLHDL 4.0 09/21/2022   Lab Results  Component Value Date   HGBA1C 6.0 (H) 09/21/2022      Assessment & Plan:  Overweight (BMI 25.0-29.9) Assessment & Plan: Reports reports walking for 30 minutes daily Reports that she has been attempting weight watchers and has been unable to lose weight We will place a referral to a nutritionist Encouraged to continue on a heart healthy diet with increased physical activity Wt Readings from Last 3 Encounters:  01/18/23 177 lb 1.3 oz (80.3 kg)  12/21/22 175 lb (79.4 kg)  12/14/22 173 lb (78.5 kg)     Orders: -     Amb ref to Medical Nutrition Therapy-MNT  IFG (impaired fasting glucose) -     Hemoglobin A1c  Vitamin D deficiency -     VITAMIN D 25 Hydroxy (Vit-D Deficiency, Fractures)  Other specified hypothyroidism -      TSH + free T4  Hyperlipidemia LDL goal <100 -     Lipid panel -     CMP14+EGFR -     CBC with Differential/Platelet    Follow-up: Return in about 4 months (around 05/20/2023).   Gilmore Laroche, FNP

## 2023-01-18 NOTE — Patient Instructions (Addendum)
I appreciate the opportunity to provide care to you today!    Follow up:  4 months  Labs: please stop by the lab during the week  to get your blood drawn (CBC, CMP, TSH, Lipid profile, HgA1c, Vit D)  Referral: Nutritionist    Please continue to a heart-healthy diet and increase your physical activities. Try to exercise for at least five days a week.      It was a pleasure to see you and I look forward to continuing to work together on your health and well-being. Please do not hesitate to call the office if you need care or have questions about your care.   Have a wonderful day and week. With Gratitude, Gilmore Laroche MSN, FNP-BC

## 2023-01-18 NOTE — Assessment & Plan Note (Addendum)
Reports reports walking for 30 minutes daily Reports that she has been attempting weight watchers and has been unable to lose weight We will place a referral to a nutritionist Encouraged to continue on a heart healthy diet with increased physical activity Wt Readings from Last 3 Encounters:  01/18/23 177 lb 1.3 oz (80.3 kg)  12/21/22 175 lb (79.4 kg)  12/14/22 173 lb (78.5 kg)

## 2023-01-18 NOTE — Progress Notes (Signed)
Rx sent to [facility] for slynd

## 2023-01-20 ENCOUNTER — Other Ambulatory Visit: Payer: Self-pay

## 2023-01-20 LAB — CBC WITH DIFFERENTIAL/PLATELET
Basophils Absolute: 0 10*3/uL (ref 0.0–0.2)
Basos: 1 %
EOS (ABSOLUTE): 0.1 10*3/uL (ref 0.0–0.4)
Eos: 2 %
Hematocrit: 40.5 % (ref 34.0–46.6)
Hemoglobin: 13.1 g/dL (ref 11.1–15.9)
Immature Grans (Abs): 0 10*3/uL (ref 0.0–0.1)
Immature Granulocytes: 0 %
Lymphocytes Absolute: 2.2 10*3/uL (ref 0.7–3.1)
Lymphs: 34 %
MCH: 27.6 pg (ref 26.6–33.0)
MCHC: 32.3 g/dL (ref 31.5–35.7)
MCV: 85 fL (ref 79–97)
Monocytes Absolute: 0.5 10*3/uL (ref 0.1–0.9)
Monocytes: 8 %
Neutrophils Absolute: 3.6 10*3/uL (ref 1.4–7.0)
Neutrophils: 55 %
Platelets: 292 10*3/uL (ref 150–450)
RBC: 4.74 x10E6/uL (ref 3.77–5.28)
RDW: 13.1 % (ref 11.7–15.4)
WBC: 6.5 10*3/uL (ref 3.4–10.8)

## 2023-01-20 LAB — CMP14+EGFR
ALT: 24 IU/L (ref 0–32)
AST: 22 IU/L (ref 0–40)
Albumin/Globulin Ratio: 1.4 (ref 1.2–2.2)
Albumin: 4.2 g/dL (ref 3.9–4.9)
Alkaline Phosphatase: 76 IU/L (ref 44–121)
BUN/Creatinine Ratio: 18 (ref 9–23)
BUN: 14 mg/dL (ref 6–20)
Bilirubin Total: 0.7 mg/dL (ref 0.0–1.2)
CO2: 20 mmol/L (ref 20–29)
Calcium: 8.8 mg/dL (ref 8.7–10.2)
Chloride: 105 mmol/L (ref 96–106)
Creatinine, Ser: 0.79 mg/dL (ref 0.57–1.00)
Globulin, Total: 3.1 g/dL (ref 1.5–4.5)
Glucose: 106 mg/dL — ABNORMAL HIGH (ref 70–99)
Potassium: 4.4 mmol/L (ref 3.5–5.2)
Sodium: 141 mmol/L (ref 134–144)
Total Protein: 7.3 g/dL (ref 6.0–8.5)
eGFR: 98 mL/min/{1.73_m2} (ref >59)

## 2023-01-20 LAB — TSH+FREE T4
Free T4: 0.95 ng/dL (ref 0.82–1.77)
TSH: 1.16 u[IU]/mL (ref 0.450–4.500)

## 2023-01-20 LAB — LIPID PANEL
Chol/HDL Ratio: 5 ratio — ABNORMAL HIGH (ref 0.0–4.4)
Cholesterol, Total: 227 mg/dL — ABNORMAL HIGH (ref 100–199)
HDL: 45 mg/dL (ref >39)
LDL Chol Calc (NIH): 148 mg/dL — ABNORMAL HIGH (ref 0–99)
Triglycerides: 186 mg/dL — ABNORMAL HIGH (ref 0–149)
VLDL Cholesterol Cal: 34 mg/dL (ref 5–40)

## 2023-01-20 LAB — HEMOGLOBIN A1C
Est. average glucose Bld gHb Est-mCnc: 123 mg/dL
Hgb A1c MFr Bld: 5.9 % — ABNORMAL HIGH (ref 4.8–5.6)

## 2023-01-20 LAB — VITAMIN D 25 HYDROXY (VIT D DEFICIENCY, FRACTURES): Vit D, 25-Hydroxy: 49.6 ng/mL (ref 30.0–100.0)

## 2023-01-20 NOTE — Telephone Encounter (Signed)
Patient contacted the office and states she would like a refill of Cimzia.   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

## 2023-01-21 MED ORDER — CERTOLIZUMAB PEGOL 200 MG/ML ~~LOC~~ PSKT: 400.0000 mg | PREFILLED_SYRINGE | SUBCUTANEOUS | 0 refills | Status: DC

## 2023-02-08 ENCOUNTER — Other Ambulatory Visit: Payer: Self-pay | Admitting: Physical Medicine & Rehabilitation

## 2023-02-15 ENCOUNTER — Ambulatory Visit: Admitting: Internal Medicine

## 2023-02-17 ENCOUNTER — Other Ambulatory Visit: Payer: Self-pay | Admitting: Family Medicine

## 2023-02-17 DIAGNOSIS — G4700 Insomnia, unspecified: Secondary | ICD-10-CM

## 2023-02-18 ENCOUNTER — Ambulatory Visit: Admitting: Internal Medicine

## 2023-02-18 ENCOUNTER — Encounter: Payer: Self-pay | Admitting: Internal Medicine
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
## 2023-02-22 ENCOUNTER — Encounter: Attending: Family Medicine | Admitting: Nutrition

## 2023-02-22 VITALS — Ht 65.0 in | Wt 179.8 lb

## 2023-02-22 DIAGNOSIS — R7303 Prediabetes: Secondary | ICD-10-CM | POA: Insufficient documentation

## 2023-02-22 DIAGNOSIS — E782 Mixed hyperlipidemia: Secondary | ICD-10-CM | POA: Insufficient documentation

## 2023-02-22 NOTE — Patient Instructions (Signed)
Goals  Increase complex carbs with breakfast Eat more whole plant based foods-- farm to table Be patient with journey. Keep a food journal Work on cutting out sodas. Lose 2 lbs per month- 1/2 lb per week.

## 2023-02-22 NOTE — Progress Notes (Unsigned)
Medical Nutrition Therapy  Appointment Start time:  1300  Appointment End time:  1400 1300 Primary concerns today: ***  Referral diagnosis: *** Preferred learning style: *** (auditory, visual, hands on, no preference indicated) Learning readiness: *** (not ready, contemplating, ready, change in progress)   NUTRITION ASSESSMENT   Anthropometrics  ***   Clinical Medical Hx: *** Medications: *** Labs: *** Notable Signs/Symptoms: ***  Lifestyle & Dietary Hx Lives with daughter and husband. Full time student.  Estimated daily fluid intake: *** oz Supplements: *** Sleep: *** Stress / self-care: *** Current average weekly physical activity: ***  24-Hr Dietary Recall First Meal: *** Snack: *** Second Meal: *** Snack: *** Third Meal: *** Snack: *** Beverages: ***  Estimated Energy Needs Calories: *** Carbohydrate: ***g Protein: ***g Fat: ***g   NUTRITION DIAGNOSIS  {CHL AMB NUTRITIONAL DIAGNOSIS:[id number]}   NUTRITION INTERVENTION  Nutrition education (E-1) on the following topics:  ***  Handouts Provided Include  ***  Learning Style & Readiness for Change Teaching method utilized: Visual & Auditory  Demonstrated degree of understanding via: Teach Back  Barriers to learning/adherence to lifestyle change: ***  Goals Established by Pt ***   MONITORING & EVALUATION Dietary intake, weekly physical activity, and *** in ***.  Next Steps  Patient is to ***.

## 2023-02-23 ENCOUNTER — Encounter: Payer: Self-pay | Admitting: Nutrition

## 2023-03-01 ENCOUNTER — Ambulatory Visit: Attending: Internal Medicine | Admitting: Internal Medicine

## 2023-03-01 ENCOUNTER — Encounter: Payer: Self-pay | Admitting: Internal Medicine

## 2023-03-01 VITALS — BP 121/81 | HR 99 | Resp 12 | Ht 65.0 in | Wt 178.0 lb

## 2023-03-01 DIAGNOSIS — M45A7 Non-radiographic axial spondyloarthritis of lumbosacral region: Secondary | ICD-10-CM | POA: Diagnosis not present

## 2023-03-01 DIAGNOSIS — M461 Sacroiliitis, not elsewhere classified: Secondary | ICD-10-CM

## 2023-03-01 DIAGNOSIS — Z79899 Other long term (current) drug therapy: Secondary | ICD-10-CM
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
## 2023-03-02 ENCOUNTER — Encounter: Payer: Self-pay | Admitting: Internal Medicine

## 2023-03-02 LAB — C-REACTIVE PROTEIN: CRP: 8.9 mg/L — ABNORMAL HIGH (ref <8.0)

## 2023-03-02 LAB — SEDIMENTATION RATE: Sed Rate: 33 mm/h — ABNORMAL HIGH (ref 0–20)

## 2023-03-02 NOTE — Progress Notes (Signed)
Sedimentation rate is 33 this is slightly worse than last time but not as high as 7 months ago. CRP is 8.9 this is just slightly above normal and better than 7 months ago. Overall still evidence of inflammation and combined with symptoms not doing great I recommend we look into changing treatment. If she agrees can start looking into Cosentyx instead.

## 2023-03-22 ENCOUNTER — Encounter: Payer: Self-pay | Admitting: Physical Medicine & Rehabilitation

## 2023-03-22 ENCOUNTER — Telehealth: Payer: Self-pay | Admitting: Pharmacy Technician

## 2023-03-22 ENCOUNTER — Encounter: Attending: Physical Medicine & Rehabilitation | Admitting: Physical Medicine & Rehabilitation

## 2023-03-22 ENCOUNTER — Encounter: Payer: Self-pay | Admitting: Internal Medicine

## 2023-03-22 VITALS — BP 119/80 | HR 85 | Ht 65.0 in | Wt 183.0 lb

## 2023-03-22 DIAGNOSIS — M7062 Trochanteric bursitis, left hip: Secondary | ICD-10-CM | POA: Insufficient documentation

## 2023-03-22 DIAGNOSIS — M7061 Trochanteric bursitis, right hip: Secondary | ICD-10-CM | POA: Insufficient documentation

## 2023-03-22 DIAGNOSIS — Z5181 Encounter for therapeutic drug level monitoring: Secondary | ICD-10-CM | POA: Diagnosis present

## 2023-03-22 DIAGNOSIS — Z79891 Long term (current) use of opiate analgesic: Secondary | ICD-10-CM | POA: Diagnosis present

## 2023-03-22 DIAGNOSIS — G894 Chronic pain syndrome: Secondary | ICD-10-CM | POA: Insufficient documentation

## 2023-03-22 DIAGNOSIS — M45A7 Non-radiographic axial spondyloarthritis of lumbosacral region: Secondary | ICD-10-CM

## 2023-03-22 DIAGNOSIS — Z79899 Other long term (current) drug therapy: Secondary | ICD-10-CM

## 2023-03-22 MED ORDER — ACETAMINOPHEN-CODEINE 300-60 MG PO TABS: 1.0000 | ORAL_TABLET | Freq: Two times a day (BID) | ORAL | 2 refills | Status: DC

## 2023-03-22 NOTE — Telephone Encounter (Signed)
-----   Message from Audrie Lia, RT sent at 03/22/2023  3:05 PM EDT ----- Regarding: COSENTYX NEW START/CONSENT AT APPT? Patient is agreeable to start Cosentyx.    I called patient, Sedimentation rate is 33 this is slightly worse than last time but not as high as 7 months ago. CRP is 8.9 this is just slightly above normal and better than 7 months ago. Overall still evidence of inflammation and combined with symptoms not doing great I recommend we look into changing treatment. If she agrees can start looking into Cosentyx instead.

## 2023-03-22 NOTE — Telephone Encounter (Signed)
Patient has Librarian, academic. Prescriptions do not require prior authorizations. Rx will need to be sent to [facility]

## 2023-03-22 NOTE — Patient Instructions (Signed)
Hip Bursitis  Hip bursitis is swelling of one or more fluid-filled sacs (bursae) in your hip joint. If the bursa becomes irritated, it can fill with extra fluid and become swollen. This condition can cause pain, and your symptoms may come and go over time. What are the causes? Repeated use of your hip muscles. Injury to the hip. Weak butt muscles. Bone spurs. Infection. In some cases, the cause may not be known. What increases the risk? Having a past hip injury or hip surgery. Having a condition, such as arthritis, gout, diabetes, or thyroid disease. Having spine problems. Having one leg that is shorter than the other. Running a lot or doing long-distance running. Playing sports where there is a risk of injury or falling, such as football, martial arts, or skiing. What are the signs or symptoms? Symptoms may come and go, and they often include: Pain in the hip or groin area. Pain may get worse when you move your hip. Tenderness and swelling of the hip. In rare cases, the bursa may become infected. If this happens, you may: Get a fever. Have warmth and redness in the hip area. How is this treated? This condition is treated by: Resting your hip. Icing your hip. Wrapping the hip area with an elastic bandage (compression wrap). Keeping the hip raised. Other treatments may include: Using crutches, a cane, or a walker. Medicines. Draining fluid out of the bursa. Surgery to take out a bursa. This is rare. Long-term treatment may include: Doing exercises to help your strength and flexibility. Lifestyle changes like losing weight to lessen the strain on your hip. Follow these instructions at home: Managing pain, stiffness, and swelling     If told, put ice on the painful area. To do this: Put ice in a plastic bag. Place a towel between your skin and the bag. Leave the ice on for 20 minutes, 2-3 times a day. Take off the ice if your skin turns bright red. This is very important.  If you cannot feel pain, heat, or cold, you have a greater risk of damage to the area. Raise your hip by putting a pillow under your hips while you lie down. Stop if you feel pain. If told, put heat on the affected area. Do this as often as told by your doctor. Use the heat source that your doctor recommends, such as a moist heat pack or a heating pad. Place a towel between your skin and the heat source. Leave the heat on for 20-30 minutes. Take off the heat if your skin turns bright red. This is very important. If you cannot feel pain, heat, or cold, you have a greater risk of getting burned. Activity Do not use your hip to support your body weight until your doctor says that you can. Use crutches, a cane, or a walker as told by your doctor. If the affected leg is one that you use to drive, ask your doctor if it is safe to drive. Rest and protect your hip as much as you can until you feel better. Return to your normal activities when your doctor says that it is safe. Do exercises as told by your doctor. General instructions Take over-the-counter and prescription medicines only as told by your doctor. Gently rub and stretch your injured area as often as is comfortable. Wear elastic bandages only as told by your doctor. If one of your legs is shorter than the other, get fitted for a shoe insert or orthotic. Keep a healthy   weight. Follow instructions from your doctor. Keep all follow-up visits. How is this prevented? Exercise regularly or as told by your doctor. Wear the right shoes for the sport you play and for daily activities. Warm up and stretch before being active. Cool down and stretch after being active. Take breaks often from repeated activity. Avoid activities that bother your hip or cause pain. Avoid sitting down for a long time. Where to find more information American Academy of Orthopaedic Surgeons: orthoinfo.aaos.org Contact a doctor if: You have a fever. You have new  symptoms. You have trouble walking or doing everyday activities. You have pain that gets worse or does not get better with medicine. The skin around your hip is red. You get a feeling of warmth in your hip area. Get help right away if: You cannot move your hip. You have very bad pain. You cannot control the muscles in your feet. Summary Hip bursitis is swelling of one or more fluid-filled sacs (bursae) in your hip joint. Symptoms often come and go over time. This condition is often treated by resting and icing the hip. It also may help to keep the area raised and wrapped in an elastic bandage. Other treatments may be needed. This information is not intended to replace advice given to you by your health care provider. Make sure you discuss any questions you have with your health care provider. Document Revised: 09/01/2021 Document Reviewed: 09/01/2021 Elsevier Patient Education  2024 Elsevier Inc.  

## 2023-03-22 NOTE — Progress Notes (Signed)
Subjective:    Patient ID: Gabriella Schmidt, female    DOB: 16-Jan-1981, 42 y.o.   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  She is here today with primary complaint of lateral hip pain this is bilateral.  She has no numbness or tingling going down the legs.  She has no significant groin pain.  She does have some low back pain.  Unfortunately she was either unable to tolerate or afford the antirheumatic drugs that she was prescribed by her rheumatologist.  She is considering alternatives.  In the meantime she feels like her pain has increased.  At last visit we reduced her Tylenol for from twice daily to daily.  She continues to take gabapentin Pain Inventory Average Pain 8 Pain Right Now 9 My pain is intermittent, constant, sharp, burning, stabbing, and aching  In the last 24 hours, has pain interfered with the following? General activity 4 Relation with others 4 Enjoyment of life 4 What TIME of day is your pain at its worst? morning , daytime, and night Sleep (in general) Fair  Pain is worse with: walking, bending, inactivity, and some activites Pain improves with: rest and medication Relief from Meds: 9  Family History  Problem Relation Age of Onset   Hypertension Mother    Skin cancer Mother    Hypertension Father    Cardiomyopathy Father    Lymphoma Father    Obesity Brother    Obesity Brother    Obesity Brother    Hypertension Maternal Grandmother    Hypertension Maternal Grandfather    Healthy Daughter    Social History   Socioeconomic History   Marital status: Married    Spouse name: Dameshia Horie   Number of children: 1   Years of education: Not on file   Highest education level: Some college, no degree  Occupational History   Not on file  Tobacco Use   Smoking status: Former    Packs/day: 0.50    Years: 13.00    Additional pack years: 0.00    Total pack years: 6.50    Types: Cigarettes    Quit date: 01/2013    Years  since quitting: 10.1    Passive exposure: Past   Smokeless tobacco: Never  Vaping Use   Vaping Use: Former   Quit date: 09/20/2018  Substance and Sexual Activity   Alcohol use: Not Currently   Drug use: Never   Sexual activity: Yes    Birth control/protection: Surgical    Comment: tubal// vasectomy  Other Topics Concern   Not on file  Social History Narrative   Not on file   Social Determinants of Health   Financial Resource Strain: Low Risk  (01/14/2023)   Overall Financial Resource Strain (CARDIA)    Difficulty of Paying Living Expenses: Not hard at all  Food Insecurity: No Food Insecurity (01/14/2023)   Hunger Vital Sign    Worried About Running Out of Food in the Last Year: Never true    Ran Out of Food in the Last Year: Never true  Transportation Needs: No Transportation Needs (01/14/2023)   PRAPARE - Administrator, Civil Service (Medical): No    Lack of Transportation (Non-Medical): No  Physical Activity: Insufficiently Active (01/14/2023)   Exercise Vital Sign    Days of Exercise per Week: 3 days    Minutes of Exercise per Session: 20 min  Stress: Stress Concern Present (01/14/2023)   [employer] of Occupational Health -  Occupational Stress Questionnaire    Feeling of Stress : To some extent  Social Connections: Moderately Integrated (01/14/2023)   Social Connection and Isolation Panel [NHANES]    Frequency of Communication with Friends and Family: More than three times a week    Frequency of Social Gatherings with Friends and Family: Twice a week    Attends Religious Services: More than 4 times per year    Active Member of [employer] or Organizations: No    Attends Banker Meetings: Never    Marital Status: Married   Past Surgical History:  Procedure Laterality Date   TOOTH EXTRACTION     TUBAL LIGATION N/A 06/29/2020   Procedure: POST PARTUM TUBAL LIGATION;  Surgeon: Tereso Newcomer, MD;  Location: [facility] LD ORS;  Service: Gynecology;   Laterality: N/A;   Past Surgical History:  Procedure Laterality Date   TOOTH EXTRACTION     TUBAL LIGATION N/A 06/29/2020   Procedure: POST PARTUM TUBAL LIGATION;  Surgeon: Tereso Newcomer, MD;  Location: [facility] LD ORS;  Service: Gynecology;  Laterality: N/A;   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   Hypertension    Pre-diabetes    BP 119/80   Pulse 85   Ht 5\' 5"  (1.651 m)   Wt 183 lb (83 kg)   SpO2 96%   BMI 30.45 kg/m   Opioid Risk Score:   Fall Risk Score:  `1  Depression screen [facility] 2/9     03/22/2023    9:40 AM 02/22/2023   12:59 PM 01/18/2023    1:14 PM 12/21/2022   11:23 AM 12/14/2022    1:48 PM 12/14/2022    1:41 PM 10/12/2022    2:52 PM  Depression screen PHQ 2/9  Decreased Interest 1 0 1 0 0 0 0  Down, Depressed, Hopeless 1 0 0 0 0 0 0  PHQ - 2 Score 2 0 1 0 0 0 0  Altered sleeping   2  2 2 2   Tired, decreased energy   3  2 2 1   Change in appetite   0  0 0 0  Feeling bad or failure about yourself    0  0 0 0  Trouble concentrating   0  1 1 1   Moving slowly or fidgety/restless   0  0 0 0  Suicidal thoughts   0  0 0 0  PHQ-9 Score   6  5 5 4   Difficult doing work/chores   Not difficult at all   Somewhat difficult Somewhat difficult    Review of Systems  Musculoskeletal:  Positive for back pain.       Pain in both hips, and both knees  All other systems reviewed and are negative.     Objective:   Physical Exam  Tenderness over the greater trochanter bilaterally. Negative straight leg raise Mild tenderness over the low lower lumbar paraspinal area as well. Ambulates without assistive device no evidence of toe drag or knee instability No evidence of joint swelling in the knees Sacral thrust (prone) : Positive Lateral compression:  Negative FABER's: Negative Distraction (supine): Negative Thigh thrust test: Positive      Assessment & Plan:  #1.  Axial spondyloarthropathy being seen by rheumatology.  In process of finding alternatives for her disease modifying antirheumatic drug.  In the meantime increase Tylenol No. 4 to twice daily Follow-up in 3 months Referral to PT for troches bursitis.  If no improvement she can schedule for a bursa injection at this office Check UDS today

## 2023-03-31 MED ORDER — COSENTYX SENSOREADY PEN 150 MG/ML ~~LOC~~ SOAJ
SUBCUTANEOUS | 2 refills | Status: DC
Start: 2023-03-31 — End: 2023-06-03

## 2023-03-31 MED ORDER — COSENTYX SENSOREADY PEN 150 MG/ML ~~LOC~~ SOAJ
SUBCUTANEOUS | 0 refills | Status: DC
Start: 2023-03-31 — End: 2023-06-03

## 2023-03-31 NOTE — Telephone Encounter (Signed)
Rx sent to [facility] pharmacy  Dose for Cosentyx for axSp: 150 mg SQ at weeks 0, 1, 2, 3, and 4 followed by 150 mg every 4 weeks thereafter. Patient states she and Dr. Dimple Casey reviewed Cosentyx at last OV and she took home patient handout.  She will follow-up with pharmacy if med is not received within 2 weeks.  Chesley Mires, PharmD, MPH, BCPS, CPP Clinical Pharmacist (Rheumatology and Pulmonology)

## 2023-04-09 ENCOUNTER — Other Ambulatory Visit: Payer: Self-pay | Admitting: Family Medicine

## 2023-04-09 DIAGNOSIS — F418 Other specified anxiety disorders: Secondary | ICD-10-CM

## 2023-04-20 ENCOUNTER — Ambulatory Visit (INDEPENDENT_AMBULATORY_CARE_PROVIDER_SITE_OTHER): Admitting: Obstetrics & Gynecology

## 2023-04-20 ENCOUNTER — Encounter: Payer: Self-pay | Admitting: Obstetrics & Gynecology

## 2023-04-20 VITALS — BP 129/79 | HR 85 | Ht 65.0 in | Wt 180.0 lb

## 2023-04-20 DIAGNOSIS — N941 Unspecified dyspareunia: Secondary | ICD-10-CM

## 2023-04-20 DIAGNOSIS — N946 Dysmenorrhea, unspecified: Secondary | ICD-10-CM

## 2023-04-20 DIAGNOSIS — Z9851 Tubal ligation status: Secondary | ICD-10-CM

## 2023-04-20 DIAGNOSIS — Z01818 Encounter for other preprocedural examination: Secondary | ICD-10-CM | POA: Diagnosis not present

## 2023-04-20 NOTE — Progress Notes (Unsigned)
   GYN VISIT Patient name: Gabriella Schmidt MRN [id number]  Date of birth: Apr 21, 1981 Chief Complaint:   talk about surgery for endometriosis  History of Present Illness:   Gabriella Schmidt is a 42 y.o. G2P0101 female being seen today for the following concerns:  Currently on Slynd since Jan 2024- notes good improvement; however, she has noted consistent weight gain.  Before on Slynd- periods were irregular.   With [facility], she does not have a period at all; however on occasion will have spotting.  The biggest concern is significant cramping- typically happens one time a day- worse than a contraction.  Pain last less than a few minutes then resolves.  Also notes moderate cramping with period, pain with sex.  She also notes cramping with BMs.  If bladder is too full will also have some cramping.    Taking tylenol 4 and Diclofenac daily due to joint inflammatory condition.  NSVD x 1, tubal ligation Diagnostic laparoscopy @ 42yo  No LMP recorded. (Menstrual status: Oral contraceptives).    Review of Systems:   Pertinent items are noted in HPI Denies fever/chills, dizziness, headaches, visual disturbances, fatigue, shortness of breath, chest pain, abdominal pain, vomiting, *** problems with periods, bowel movements, urination, or intercourse unless otherwise stated above.  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 Physical Assessment:   Vitals:   04/20/23 1431  BP: 129/79  Pulse: 85  Weight: 180 lb (81.6 kg)  Height: 5\' 5"  (1.651 m)  Body mass index is 29.95 kg/m.       Physical Examination:   General appearance: alert, well appearing, and in no distress  Psych: mood appropriate, normal affect  Skin: warm & dry   Cardiovascular: normal heart rate noted  Respiratory: normal respiratory effort, no distress  Abdomen: soft, non-tender   Pelvic: {:[id number]}  Extremities: no edema   Chaperone: {Chaperone:[id number]::"N/A","Latisha Cresenzo","Janet Young","Amanda Andrews","Peggy Dones","Nicole Jones","Angel Neas"}    Assessment & Plan:  1) ***   No orders of the defined types were placed in this encounter.   No follow-ups on file.   Myna Hidalgo, DO Attending Obstetrician & Gynecologist, [facility] for [employer], [facility]

## 2023-04-21 ENCOUNTER — Encounter: Payer: Self-pay | Admitting: Obstetrics & Gynecology

## 2023-04-26 ENCOUNTER — Encounter: Payer: Self-pay | Admitting: Obstetrics & Gynecology

## 2023-04-28 NOTE — Therapy (Signed)
OUTPATIENT PHYSICAL THERAPY LOWER EXTREMITY EVALUATION   Patient Name: BRILYN PHON MRN: [id number] DOB:Oct 25, 1980, 42 y.o., female Today's Date: 04/29/2023  END OF SESSION:  PT End of Session - 04/29/23 1616     Visit Number 1    Date for PT Re-Evaluation 05/13/23    Authorization Type [facility]    Authorization Time Period No auth; no visit limit    Authorization - Visit Number 1    Progress Note Due on Visit 4    PT Start Time 1300    PT Stop Time 1343    PT Time Calculation (min) 43 min    Activity Tolerance Patient tolerated treatment well    Behavior During Therapy [facility] for tasks assessed/performed             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   Hypertension    Pre-diabetes    Past Surgical History:  Procedure Laterality Date   TOOTH EXTRACTION     TUBAL LIGATION N/A 06/29/2020   Procedure: POST PARTUM TUBAL LIGATION;  Surgeon: Tereso Newcomer, MD;  Location: [facility] LD ORS;  Service: Gynecology;  Laterality: N/A;   Patient Active Problem List   Diagnosis Date Noted   Trochanteric bursitis of both hips 03/22/2023   Encounter for gynecological examination with Papanicolaou smear of cervix 12/14/2022   High risk medication use 09/28/2022   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/2022   Bilateral sacroiliitis (HCC) 07/27/2022   CRP elevated 07/27/2022   Positive ANA (antinuclear antibody) 07/27/2022   Pain of both breasts 07/07/2022   Pregnancy examination or test, negative result 07/07/2022   Prolonged periods 07/07/2022   Menorrhagia with regular cycle 03/31/2022   Uterine cramping 03/31/2022   Dyspareunia, female 03/17/2022   Urinary incontinence 03/17/2022   Hematuria 03/17/2022   Overweight (BMI  25.0-29.9) 01/05/2022   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   School health examination 12/10/2021   Weight gain 11/23/2021   Fatigue 11/23/2021   Nausea 11/23/2021   Breast tenderness 11/23/2021   Mass of upper outer quadrant of left breast 11/23/2021   Mass of lower inner quadrant of right breast 11/23/2021   Proteinuria 11/23/2021   Urinary frequency 11/23/2021   Pregnancy test negative 11/23/2021   Anemia 09/02/2021   COVID-19 04/17/2021   Insomnia 01/14/2021   Back pain 01/14/2021   Liver function test abnormality 10/06/2020   Hyperlipidemia 10/06/2020   Dizziness 10/06/2020   Degenerative disc disease at L5-S1 level 10/06/2020   Asthma 09/01/2020   Chronic hypertension 01/31/2020   Depression with anxiety 01/31/2020    PCP: Gilmore Laroche FNP  REFERRING PROVIDER: Claudette Laws MD  REFERRING DIAG: M70.61,M70.62 (ICD-10-CM) - Trochanteric bursitis of both hips   THERAPY DIAG:  Bursitis of both hips, unspecified bursa  Muscle weakness (generalized)  Rationale for Evaluation and Treatment: Rehabilitation  ONSET DATE: ~15 years  SUBJECTIVE:   SUBJECTIVE STATEMENT: Pt reports primarily hip pain on L greater than R. Reports shooting pain and that bones hurt. Reports feels like sleeping on rock. 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 Pt reports L5-S1 slipped disc and received physical therapy for low back but did not have help. Seeing Pain management  specialis in 2014. Recent birth 3 years ago, baby was cephalic in utero and pushed on back a lot which caused a lot of pain. Took prescribed pain meds today, first thing in the mornings and after walking on campus most of day. "It is hard because I can't do the things I want to do." Left dominant.  PERTINENT HISTORY: Anxiety depression sleep disorder PAIN:  Are you having pain? Yes: NPRS scale: 5/10 Pain location: Bilateral trochanters Pain description: Dull, throbbing, sharp pain.   Aggravating factors: walking, sleeping, can't sleep on one side.  Relieving factors: Pain medications, stretching  PRECAUTIONS: None  RED FLAGS: None   WEIGHT BEARING RESTRICTIONS: No  FALLS:  Has patient fallen in last 6 months? 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   PLOF: Independent  PATIENT GOALS: "want to be able to workout  NEXT MD VISIT: In 2 weeks  OBJECTIVE:   DIAGNOSTIC FINDINGS:   PATIENT SURVEYS:  FOTO 49.6%  COGNITION: Overall cognitive status: Within functional limits for tasks assessed     SENSATION: WFL   POSTURE: No Significant postural limitations  PALPATION: Tender to palpate along bilateral greater trochanters and distal into iliotibial band.  LOWER EXTREMITY ROM:  Active ROM Right eval Left eval  Hip flexion    Hip extension    Hip abduction    Hip adduction    Hip internal rotation    Hip external rotation    Knee flexion    Knee extension    Ankle dorsiflexion    Ankle plantarflexion    Ankle inversion    Ankle eversion     (Blank rows = not tested)  LOWER EXTREMITY MMT:  MMT Right eval Left eval  Hip flexion 4- 4-  Hip extension 4 4  Hip abduction 4- 4-  Hip adduction    Hip internal rotation    Hip external rotation    Knee flexion    Knee extension 4+ 4+  Ankle dorsiflexion    Ankle plantarflexion    Ankle inversion    Ankle eversion     (Blank rows = not tested)  LOWER EXTREMITY SPECIAL TESTS:  Hip special tests: FADDIR (+) bilaterally  FUNCTIONAL TESTS:  5 times sit to stand: 14 seconds  GAIT: Distance walked: 15 feet Assistive device utilized: None Level of assistance: Complete Independence Comments: Bilateral knee valgus and Trendelenburg during stance phase.   TODAY'S TREATMENT:                                                                                                                               DATE: PT evaluation, findings, prognosis, HEP, frequency.   PATIENT EDUCATION:  Education details: Patient was educated on prognosis and heavy HEP emphasis with potential hysterectomy coming up in  2 weeks which would limit and required new PT evaluation.  Patient in agreement with plan to utilize next 2 weeks for strengthening, to assess with surgeon for possible return back to physical therapy. Person educated: Patient Education method: Explanation Education comprehension: verbalized understanding and returned demonstration  HOME EXERCISE PROGRAM: Access Code: [email or web address] URL: [email or web address] Date: 04/29/2023 Prepared by: Starling Manns  Exercises - Clamshell with Resistance  - 1 x daily - 7 x weekly - 3 sets - 10 reps - Supine Bridge with Resistance Band  - 1 x daily - 7 x weekly - 3 sets - 10 reps - Wall Quarter Squat  - 1 x daily - 7 x weekly - 3 sets - 10 reps - Side Stepping with Resistance at Ankles  - 1 x daily - 7 x weekly - 3 sets - 10 reps - Single Leg Stance  - 1 x daily - 7 x weekly - 3 sets - 10 reps  ASSESSMENT:  CLINICAL IMPRESSION: Pt is a pleasant 42 year old female who is presenting to physical therapy today for bilateral hip pain left greater than right.  Patient is referred to physical therapy by MD for M70.61,M70.62 (ICD-10-CM) - Trochanteric bursitis of both hips . 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  Patient reports not being able to do fun activities that she enjoys and is limited throughout walking across her campus due to increased pain in hips..  Based upon  today's evaluation, pt is demonstrating reduced functional activity tolerance and functional strength deficits due to BLE muscle weakness, limiting her functional capacity and endurance for ADLs.  Patient would benefit from skilled physical therapy services to address the above impairments/limitations and improve overall QOL and participation in functional activities..   *Patient has planned hysterectomy in 2 weeks and providing 2 weeks worth of skilled physical therapy to prepare and provide HEP*  OBJECTIVE IMPAIRMENTS: Abnormal gait, decreased endurance, difficulty walking, decreased strength, and pain.   ACTIVITY LIMITATIONS: lifting, bending, sitting, standing, stairs, and locomotion level  PARTICIPATION LIMITATIONS: community activity, occupation, yard work, and church  PERSONAL FACTORS: Age, Behavior pattern, and Fitness are also affecting patient's functional outcome.   REHAB POTENTIAL: Good  CLINICAL DECISION MAKING: Stable/uncomplicated  EVALUATION COMPLEXITY: Low   GOALS: Goals reviewed with patient? No  SHORT TERM GOALS: Target date: 05/06/2023 Pt will be independent with HEP in order to demonstrate participation in Physical Therapy POC.  Baseline: Goal status: INITIAL  2.  Patient will report >25% improved subjective report of pain in bilateral hips to improve function Baseline: Next session Goal status: INITIAL   LONG TERM GOALS: Target date: 05/13/2023  Patient will improve hip abductor strength to 4/5 MMT in order to improve muscular strength.  Baseline: See objective Goal status: INITIAL  2.  Patient will improve hip flexion abductor strength to 4/5 MMT in order to improve muscular strength.  Baseline: See objective Goal status: INITIAL  3.  Patient will improve 5 times sit to stand by decreasing time by >3 seconds to demonstrate improved functional strength Baseline: See objective Goal status: INITIAL  4.  Patient will increase FOTO by M CID in order to  demonstrate improve functional pain ratings.  Baseline: See objective Goal status: INITIAL    PLAN:  PT FREQUENCY: 2x/week  PT  DURATION: 2 weeks  PLANNED INTERVENTIONS: Therapeutic exercises, Therapeutic activity, Neuromuscular re-education, Balance training, Gait training, Patient/Family education, Self Care, Joint mobilization, Manual therapy, and Re-evaluation  PLAN FOR NEXT SESSION: Heavy BLE hip strengthening.   Nelida Meuse, PT 04/29/2023, 4:18 PM

## 2023-04-29 ENCOUNTER — Ambulatory Visit (INDEPENDENT_AMBULATORY_CARE_PROVIDER_SITE_OTHER)

## 2023-04-29 ENCOUNTER — Encounter (HOSPITAL_COMMUNITY): Payer: Self-pay

## 2023-04-29 ENCOUNTER — Other Ambulatory Visit: Payer: Self-pay

## 2023-04-29 ENCOUNTER — Ambulatory Visit (HOSPITAL_COMMUNITY): Attending: Physical Medicine & Rehabilitation

## 2023-04-29 DIAGNOSIS — N941 Unspecified dyspareunia: Secondary | ICD-10-CM | POA: Diagnosis not present

## 2023-04-29 DIAGNOSIS — M7072 Other bursitis of hip, left hip: Secondary | ICD-10-CM | POA: Insufficient documentation

## 2023-04-29 DIAGNOSIS — M7062 Trochanteric bursitis, left hip: Secondary | ICD-10-CM | POA: Insufficient documentation

## 2023-04-29 DIAGNOSIS — M7061 Trochanteric bursitis, right hip: Secondary | ICD-10-CM | POA: Diagnosis not present

## 2023-04-29 DIAGNOSIS — Z01818 Encounter for other preprocedural examination: Secondary | ICD-10-CM

## 2023-04-29 DIAGNOSIS — M7071 Other bursitis of hip, right hip: Secondary | ICD-10-CM | POA: Insufficient documentation

## 2023-04-29 DIAGNOSIS — M6281 Muscle weakness (generalized): Secondary | ICD-10-CM | POA: Diagnosis present

## 2023-04-29 DIAGNOSIS — N946 Dysmenorrhea, unspecified: Secondary | ICD-10-CM | POA: Diagnosis not present

## 2023-04-29 NOTE — Progress Notes (Signed)
PELVIC US TA/TV: homogeneous anteverted uterus,WNL,EEC 4.2 mm,normal ovaries,ovaries appear mobile,no free fluid,no pain during ultrasound  Chaperone Marchelle Folks

## 2023-04-30 ENCOUNTER — Other Ambulatory Visit: Payer: Self-pay | Admitting: Family Medicine

## 2023-04-30 DIAGNOSIS — E559 Vitamin D deficiency, unspecified: Secondary | ICD-10-CM

## 2023-05-02 ENCOUNTER — Ambulatory Visit (HOSPITAL_COMMUNITY)

## 2023-05-02 ENCOUNTER — Encounter (HOSPITAL_COMMUNITY): Payer: Self-pay

## 2023-05-02 ENCOUNTER — Ambulatory Visit: Admitting: Nutrition

## 2023-05-02 DIAGNOSIS — M7071 Other bursitis of hip, right hip: Secondary | ICD-10-CM

## 2023-05-02 DIAGNOSIS — M6281 Muscle weakness (generalized): Secondary | ICD-10-CM

## 2023-05-02 NOTE — Therapy (Signed)
OUTPATIENT PHYSICAL THERAPY LOWER EXTREMITY EVALUATION   Patient Name: Gabriella Schmidt MRN: [id number] DOB:July 29, 1981, 42 y.o., female Today's Date: 05/02/2023  END OF SESSION:  PT End of Session - 05/02/23 0828     Visit Number 2    Number of Visits 4    Date for PT Re-Evaluation 05/13/23    Authorization Type [facility]    Authorization Time Period No auth; no visit limit    Authorization - Visit Number 2    Progress Note Due on Visit 4    PT Start Time [phone]    PT Stop Time 0900    PT Time Calculation (min) 37 min    Activity Tolerance Patient tolerated treatment well    Behavior During Therapy [facility] for tasks assessed/performed              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   Hypertension    Pre-diabetes    Past Surgical History:  Procedure Laterality Date   TOOTH EXTRACTION     TUBAL LIGATION N/A 06/29/2020   Procedure: POST PARTUM TUBAL LIGATION;  Surgeon: Tereso Newcomer, MD;  Location: [facility] LD ORS;  Service: Gynecology;  Laterality: N/A;   Patient Active Problem List   Diagnosis Date Noted   Trochanteric bursitis of both hips 03/22/2023   Encounter for gynecological examination with Papanicolaou smear of cervix 12/14/2022   High risk medication use 09/28/2022   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/2022   Bilateral sacroiliitis (HCC) 07/27/2022   CRP elevated 07/27/2022   Positive ANA (antinuclear antibody) 07/27/2022   Pain of both breasts 07/07/2022   Pregnancy examination or test, negative result 07/07/2022   Prolonged periods 07/07/2022   Menorrhagia with regular cycle 03/31/2022   Uterine cramping 03/31/2022   Dyspareunia, female 03/17/2022   Urinary incontinence 03/17/2022   Hematuria  03/17/2022   Overweight (BMI 25.0-29.9) 01/05/2022   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   School health examination 12/10/2021   Weight gain 11/23/2021   Fatigue 11/23/2021   Nausea 11/23/2021   Breast tenderness 11/23/2021   Mass of upper outer quadrant of left breast 11/23/2021   Mass of lower inner quadrant of right breast 11/23/2021   Proteinuria 11/23/2021   Urinary frequency 11/23/2021   Pregnancy test negative 11/23/2021   Anemia 09/02/2021   COVID-19 04/17/2021   Insomnia 01/14/2021   Back pain 01/14/2021   Liver function test abnormality 10/06/2020   Hyperlipidemia 10/06/2020   Dizziness 10/06/2020   Degenerative disc disease at L5-S1 level 10/06/2020   Asthma 09/01/2020   Chronic hypertension 01/31/2020   Depression with anxiety 01/31/2020    PCP: Gilmore Laroche FNP  REFERRING PROVIDER: Claudette Laws MD  REFERRING DIAG: M70.61,M70.62 (ICD-10-CM) - Trochanteric bursitis of both hips   THERAPY DIAG:  Bursitis of both hips, unspecified bursa  Muscle weakness (generalized)  Rationale for Evaluation and Treatment: Rehabilitation  ONSET DATE: ~15 years  SUBJECTIVE:   SUBJECTIVE STATEMENT: Pt reporting 7/10 pain. Not too bad. No crazy movement over the weekend. Pt reporting clamshells and bridges from last session over the weekend.   Eval:Pt reports primarily hip pain on L greater than R. Reports shooting pain and that bones hurt.  Reports feels like sleeping on rock. 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 Pt reports L5-S1 slipped disc and received physical therapy for low back but did not have help. Seeing Pain management specialis in 2014. Recent birth 3 years ago, baby was cephalic in utero and pushed on back a lot which caused a lot of pain. Took prescribed pain meds today, first thing in the mornings and after walking on campus most of day. "It is hard because I can't do the things I want to do." Left dominant.  PERTINENT  HISTORY: Anxiety depression sleep disorder PAIN:  Are you having pain? Yes: NPRS scale: 5/10 Pain location: Bilateral trochanters Pain description: Dull, throbbing, sharp pain.  Aggravating factors: walking, sleeping, can't sleep on one side.  Relieving factors: Pain medications, stretching  PRECAUTIONS: None  RED FLAGS: None   WEIGHT BEARING RESTRICTIONS: No  FALLS:  Has patient fallen in last 6 months? 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   PLOF: Independent  PATIENT GOALS: "want to be able to workout  NEXT MD VISIT: In 2 weeks  OBJECTIVE:   DIAGNOSTIC FINDINGS:   PATIENT SURVEYS:  FOTO 49.6%  COGNITION: Overall cognitive status: Within functional limits for tasks assessed     SENSATION: WFL   POSTURE: No Significant postural limitations  PALPATION: Tender to palpate along bilateral greater trochanters and distal into iliotibial band.  LOWER EXTREMITY ROM:  Active ROM Right eval Left eval  Hip flexion    Hip extension    Hip abduction    Hip adduction    Hip internal rotation    Hip external rotation    Knee flexion    Knee extension    Ankle dorsiflexion    Ankle plantarflexion    Ankle inversion    Ankle eversion     (Blank rows = not tested)  LOWER EXTREMITY MMT:  MMT Right eval Left eval  Hip flexion 4- 4-  Hip extension 4 4  Hip abduction 4- 4-  Hip adduction    Hip internal rotation    Hip external rotation    Knee flexion    Knee extension 4+ 4+  Ankle dorsiflexion    Ankle plantarflexion    Ankle inversion    Ankle eversion     (Blank rows = not tested)  LOWER EXTREMITY SPECIAL TESTS:  Hip special tests: FADDIR (+) bilaterally  FUNCTIONAL TESTS:  5 times sit to stand: 14 seconds  GAIT: Distance walked: 15  feet Assistive device utilized: None Level of assistance: Complete Independence Comments: Bilateral knee valgus and Trendelenburg during stance phase.   TODAY'S TREATMENT:  DATE:  05/02/2023  -Recumbent bike seat 11 lvl 4 --> lvl 1 due to fatigue x 4  -HEP overview -Glute brides 1 x fatigue with cues for TA bracing -Clamshells bilaterally x 1 to fatigue  -Standing hip abduction at parallel bars 1 x 10 bilaterally -Sidesteps in parallel bars w/ RTB 77ft x 4 -Squat at parallel bars-cues for pelvic movement first prior to knee flexion.    PT evaluation, findings, prognosis, HEP, frequency.   PATIENT EDUCATION:  Education details: see new HEP. . Person educated: Patient Education method: Explanation Education comprehension: verbalized understanding and returned demonstration  HOME EXERCISE PROGRAM: Access Code: [email or web address] URL: [email or web address] Date: 04/29/2023 Prepared by: Starling Manns  Exercises - Clamshell with Resistance  - 1 x daily - 7 x weekly - 3 sets - 10 reps - Supine Bridge with Resistance Band  - 1 x daily - 7 x weekly - 3 sets - 10 reps - Wall Quarter Squat  - 1 x daily - 7 x weekly - 3 sets - 10 reps - Side Stepping with Resistance at Ankles  - 1 x daily - 7 x weekly - 3 sets - 10 reps - Single Leg Stance  - 1 x daily - 7 x weekly - 3 sets - 10 reps   05/02/2023 Access Code: [email or web address] URL: [email or web address] Date: 05/02/2023 Prepared by: Starling Manns  Exercises - Standing Hip Abduction with Counter Support  - 1 x daily - 7 x weekly - 3 sets - 10 reps - Standing Hip Extension with Counter Support  - 1 x daily - 7 x weekly - 3 sets - 10 reps - Side Stepping with Resistance at Ankles and Counter Support  - 1 x daily - 7 x weekly - 3 sets - 10 reps - Squat with Chair Touch  - 1 x daily - 7 x weekly - 3 sets  - 10 reps  ASSESSMENT:  CLINICAL IMPRESSION: Pt tolerating session well. Showing good return on HEP, with reduced cuing and education for proper form. Fatiguing appropriately with hip abductors this session. Increased anterior pelvic tilting with functional squats and increased lumbar paraspinal fatigue. Next session address proper movement patterns and reduced compensation. Pt will continue to benefit from skilled physical therapy services to address functional deficits/pain and improve outcomes.   Pt is a pleasant 42 year old female who is presenting to physical therapy today for bilateral hip pain left greater than right.  Patient is referred to physical therapy by MD for M70.61,M70.62 (ICD-10-CM) - Trochanteric bursitis of both hips . 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  Patient reports not being able to do fun activities that she enjoys and is limited throughout walking across her campus due to increased pain in hips..  Based upon today's evaluation, pt is demonstrating reduced functional activity tolerance and functional strength deficits due to BLE muscle weakness, limiting her functional capacity and endurance for ADLs.  Patient would benefit from skilled physical therapy services to address the above impairments/limitations and improve overall QOL and participation in functional activities..   *Patient has planned hysterectomy in 2 weeks and providing 2 weeks worth of skilled physical therapy to prepare and provide HEP*  OBJECTIVE IMPAIRMENTS: Abnormal gait, decreased endurance, difficulty walking, decreased strength, and pain.   ACTIVITY LIMITATIONS: lifting, bending,  sitting, standing, stairs, and locomotion  level  PARTICIPATION LIMITATIONS: community activity, occupation, yard work, and church  PERSONAL FACTORS: Age, Behavior pattern, and Fitness are also affecting patient's functional outcome.   REHAB POTENTIAL: Good  CLINICAL DECISION MAKING: Stable/uncomplicated  EVALUATION COMPLEXITY: Low   GOALS: Goals reviewed with patient? No  SHORT TERM GOALS: Target date: 05/06/2023 Pt will be independent with HEP in order to demonstrate participation in Physical Therapy POC.  Baseline: Goal status: INITIAL  2.  Patient will report >25% improved subjective report of pain in bilateral hips to improve function Baseline: Next session Goal status: INITIAL   LONG TERM GOALS: Target date: 05/13/2023  Patient will improve hip abductor strength to 4/5 MMT in order to improve muscular strength.  Baseline: See objective Goal status: INITIAL  2.  Patient will improve hip flexion abductor strength to 4/5 MMT in order to improve muscular strength.  Baseline: See objective Goal status: INITIAL  3.  Patient will improve 5 times sit to stand by decreasing time by >3 seconds to demonstrate improved functional strength Baseline: See objective Goal status: INITIAL  4.  Patient will increase FOTO by M CID in order to demonstrate improve functional pain ratings.  Baseline: See objective Goal status: INITIAL    PLAN:  PT FREQUENCY: 2x/week  PT DURATION: 2 weeks  PLANNED INTERVENTIONS: Therapeutic exercises, Therapeutic activity, Neuromuscular re-education, Balance training, Gait training, Patient/Family education, Self Care, Joint mobilization, Manual therapy, and Re-evaluation  PLAN FOR NEXT SESSION: Heavy BLE hip strengthening.   Nelida Meuse, PT 05/02/2023, 9:01 AM

## 2023-05-10 ENCOUNTER — Encounter (HOSPITAL_COMMUNITY): Admitting: Physical Therapy

## 2023-05-12 ENCOUNTER — Encounter (HOSPITAL_COMMUNITY): Payer: Self-pay

## 2023-05-12 ENCOUNTER — Ambulatory Visit (HOSPITAL_COMMUNITY)

## 2023-05-12 DIAGNOSIS — M6281 Muscle weakness (generalized): Secondary | ICD-10-CM

## 2023-05-12 DIAGNOSIS — M7071 Other bursitis of hip, right hip: Secondary | ICD-10-CM | POA: Diagnosis not present

## 2023-05-12 NOTE — Therapy (Signed)
OUTPATIENT PHYSICAL THERAPY LOWER EXTREMITY TREATMENT   Patient Name: Gabriella Schmidt MRN: [id number] DOB:Jan 09, 1981, 42 y.o., female Today's Date: 05/12/2023  END OF SESSION:  PT End of Session - 05/12/23 1514     Visit Number 3    Number of Visits 4    Date for PT Re-Evaluation 05/13/23    Authorization Type [facility]    Authorization Time Period No auth; no visit limit    Authorization - Visit Number 3    Progress Note Due on Visit 4    PT Start Time 1431    PT Stop Time 1513    PT Time Calculation (min) 42 min    Activity Tolerance Patient tolerated treatment well    Behavior During Therapy [facility] for tasks assessed/performed               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   Hypertension    Pre-diabetes    Past Surgical History:  Procedure Laterality Date   TOOTH EXTRACTION     TUBAL LIGATION N/A 06/29/2020   Procedure: POST PARTUM TUBAL LIGATION;  Surgeon: Tereso Newcomer, MD;  Location: [facility] LD ORS;  Service: Gynecology;  Laterality: N/A;   Patient Active Problem List   Diagnosis Date Noted   Trochanteric bursitis of both hips 03/22/2023   Encounter for gynecological examination with Papanicolaou smear of cervix 12/14/2022   High risk medication use 09/28/2022   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/2022   Bilateral sacroiliitis (HCC) 07/27/2022   CRP elevated 07/27/2022   Positive ANA (antinuclear antibody) 07/27/2022   Pain of both breasts 07/07/2022   Pregnancy examination or test, negative result 07/07/2022   Prolonged periods 07/07/2022   Menorrhagia with regular cycle 03/31/2022   Uterine cramping 03/31/2022   Dyspareunia, female 03/17/2022   Urinary incontinence 03/17/2022   Hematuria  03/17/2022   Overweight (BMI 25.0-29.9) 01/05/2022   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   School health examination 12/10/2021   Weight gain 11/23/2021   Fatigue 11/23/2021   Nausea 11/23/2021   Breast tenderness 11/23/2021   Mass of upper outer quadrant of left breast 11/23/2021   Mass of lower inner quadrant of right breast 11/23/2021   Proteinuria 11/23/2021   Urinary frequency 11/23/2021   Pregnancy test negative 11/23/2021   Anemia 09/02/2021   COVID-19 04/17/2021   Insomnia 01/14/2021   Back pain 01/14/2021   Liver function test abnormality 10/06/2020   Hyperlipidemia 10/06/2020   Dizziness 10/06/2020   Degenerative disc disease at L5-S1 level 10/06/2020   Asthma 09/01/2020   Chronic hypertension 01/31/2020   Depression with anxiety 01/31/2020    PCP: Gilmore Laroche FNP  REFERRING PROVIDER: Claudette Laws MD  REFERRING DIAG: M70.61,M70.62 (ICD-10-CM) - Trochanteric bursitis of both hips   THERAPY DIAG:  Bursitis of both hips, unspecified bursa  Muscle weakness (generalized)  Rationale for Evaluation and Treatment: Rehabilitation  ONSET DATE: ~15 years  SUBJECTIVE:   SUBJECTIVE STATEMENT: Pt reporting 8/10 pain in back, limited hip pain. Pt reporting back on campus yesterday and hips are suprisingly feeling good. HEP is going well..   Eval:Pt reports primarily hip pain on L greater than R. Reports shooting pain and that  bones hurt. Reports feels like sleeping on rock. 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 Pt reports L5-S1 slipped disc and received physical therapy for low back but did not have help. Seeing Pain management specialis in 2014. Recent birth 3 years ago, baby was cephalic in utero and pushed on back a lot which caused a lot of pain. Took prescribed pain meds today, first thing in the mornings and after walking on campus most of day. "It is hard because I can't do the things I want to do." Left dominant.   PERTINENT HISTORY: Anxiety depression sleep disorder PAIN:  Are you having pain? Yes: NPRS scale: 5/10 Pain location: Bilateral trochanters Pain description: Dull, throbbing, sharp pain.  Aggravating factors: walking, sleeping, can't sleep on one side.  Relieving factors: Pain medications, stretching  PRECAUTIONS: None  RED FLAGS: None   WEIGHT BEARING RESTRICTIONS: No  FALLS:  Has patient fallen in last 6 months? 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   PLOF: Independent  PATIENT GOALS: "want to be able to workout  NEXT MD VISIT: In 2 weeks  OBJECTIVE:   DIAGNOSTIC FINDINGS:   PATIENT SURVEYS:  FOTO 49.6%  COGNITION: Overall cognitive status: Within functional limits for tasks assessed     SENSATION: WFL   POSTURE: No Significant postural limitations  PALPATION: Tender to palpate along bilateral greater trochanters and distal into iliotibial band.  LOWER EXTREMITY ROM:  Active ROM Right eval Left eval  Hip flexion    Hip extension    Hip abduction    Hip adduction    Hip internal rotation    Hip external rotation    Knee flexion    Knee extension    Ankle dorsiflexion    Ankle plantarflexion    Ankle inversion    Ankle eversion     (Blank rows = not tested)  LOWER EXTREMITY MMT:  MMT Right eval Left eval  Hip flexion 4- 4-  Hip extension 4 4  Hip abduction 4- 4-  Hip adduction    Hip internal rotation    Hip external rotation    Knee flexion    Knee extension 4+ 4+  Ankle dorsiflexion    Ankle plantarflexion    Ankle inversion    Ankle eversion     (Blank rows = not tested)  LOWER EXTREMITY SPECIAL TESTS:  Hip special tests: FADDIR (+) bilaterally  FUNCTIONAL TESTS:  5 times sit to stand: 14 seconds  GAIT: Distance  walked: 15 feet Assistive device utilized: None Level of assistance: Complete Independence Comments: Bilateral knee valgus and Trendelenburg during stance phase.   TODAY'S TREATMENT:  DATE:  05/12/2023  -Recumbent bike seat 11 lvl 4 x 7' -Squat practice with hip hinges to chair x 10 -Standing hip abduction 3 x 10 w/ 2lb ankle weight; cues for reduced ER.  -Piriformis stretch RLE 1 x 30' -Prone qaudricep stretch bilaterally x 30' -Modified side plank with contralateral hip abduction 2-3' x 5 bilaterally -Tandem stance x 1' bilaterally -hip hikes x 10 bilaterally -built HEP   05/02/2023  -Recumbent bike seat 11 lvl 4 --> lvl 1 due to fatigue x 4  -HEP overview -Glute brides 1 x fatigue with cues for TA bracing -Clamshells bilaterally x 1 to fatigue  -Standing hip abduction at parallel bars 1 x 10 bilaterally -Sidesteps in parallel bars w/ RTB 84ft x 4 -Squat at parallel bars-cues for pelvic movement first prior to knee flexion.    PT evaluation, findings, prognosis, HEP, frequency.   PATIENT EDUCATION:  Education details: see new HEP. . Person educated: Patient Education method: Explanation Education comprehension: verbalized understanding and returned demonstration  HOME EXERCISE PROGRAM: Access Code: [email or web address] URL: [email or web address] Date: 05/12/2023 Prepared by: Starling Manns  Exercises - Clamshell with Resistance  - 1 x daily - 7 x weekly - 3 sets - 10 reps - Supine Bridge with Resistance Band  - 1 x daily - 7 x weekly - 3 sets - 10 reps - Wall Quarter Squat  - 1 x daily - 7 x weekly - 3 sets - 10 reps - Side Stepping with Resistance at Ankles  - 1 x daily - 7 x weekly - 3 sets - 10 reps - Single Leg Stance  - 1 x daily - 7 x weekly - 3 sets - 10 reps - Tandem Stance  - 1 x daily - 7 x weekly - 3 sets - 10 reps - Prone  Quadriceps Stretch with Strap  - 1 x daily - 7 x weekly - 3 sets - 10 reps - Side Plank with Clam  - 1 x daily - 7 x weekly - 3 sets - 10 reps - Single Leg Stance  - 1 x daily - 7 x weekly - 3 sets - 10 reps - Hip Hiking on Step  - 1 x daily - 7 x weekly - 3 sets - 10 reps  ASSESSMENT:  CLINICAL IMPRESSION: Pt tolerating session well. Extended HEP built for hysterectomy planned for next week. Will pend DC until surgeon either clears and advices to rest until timeframe is complete. Pt benefits from visual and verbal cues.  . Pt will continue to benefit from skilled physical therapy services to address functional deficits/pain and improve outcomes.   *Patient has planned hysterectomy in 2 weeks and providing 2 weeks worth of skilled physical therapy to prepare and provide HEP*  OBJECTIVE IMPAIRMENTS: Abnormal gait, decreased endurance, difficulty walking, decreased strength, and pain.   ACTIVITY LIMITATIONS: lifting, bending, sitting, standing, stairs, and locomotion level  PARTICIPATION LIMITATIONS: community activity, occupation, yard work, and church  PERSONAL FACTORS: Age, Behavior pattern, and Fitness are also affecting patient's functional outcome.   REHAB POTENTIAL: Good  CLINICAL DECISION MAKING: Stable/uncomplicated  EVALUATION COMPLEXITY: Low   GOALS: Goals reviewed with patient? No  SHORT TERM GOALS: Target date: 05/06/2023 Pt will be independent with HEP in order to demonstrate participation in Physical Therapy POC.  Baseline: Goal status: INITIAL  2.  Patient will report >25% improved subjective report of pain in bilateral hips to improve function Baseline: Next session Goal status: INITIAL   LONG TERM GOALS: Target date:  05/13/2023  Patient will improve hip abductor strength to 4/5 MMT in order to improve muscular strength.  Baseline: See objective Goal status: INITIAL  2.  Patient will improve hip flexion abductor strength to 4/5 MMT in order to improve  muscular strength.  Baseline: See objective Goal status: INITIAL  3.  Patient will improve 5 times sit to stand by decreasing time by >3 seconds to demonstrate improved functional strength Baseline: See objective Goal status: INITIAL  4.  Patient will increase FOTO by M CID in order to demonstrate improve functional pain ratings.  Baseline: See objective Goal status: INITIAL    PLAN:  PT FREQUENCY: 2x/week  PT DURATION: 2 weeks  PLANNED INTERVENTIONS: Therapeutic exercises, Therapeutic activity, Neuromuscular re-education, Balance training, Gait training, Patient/Family education, Self Care, Joint mobilization, Manual therapy, and Re-evaluation  PLAN FOR NEXT SESSION: Heavy BLE hip strengthening.   Nelida Meuse, PT 05/12/2023, 3:15 PM

## 2023-05-12 NOTE — Patient Instructions (Addendum)
Gabriella Schmidt  05/12/2023     @PREFPERIOPPHARMACY @   Your procedure is scheduled on  05/17/2023.   Report to [facility] at  0600  A.M.   Call this number if you have problems the morning of surgery:  [phone]  If you experience any cold or flu symptoms such as cough, fever, chills, shortness of breath, etc. between now and your scheduled surgery, please notify us at the above number.   Remember:  Do not eat or drink after midnight.      Use your inhaler before you come and bring your rescue inhaler with you.     Take these medicines the morning of surgery with A SIP OF WATER                  Tylenol#4 (if needed), gabapentin, sertraline.     Do not wear jewelry, make-up or nail polish, including gel polish,  artificial nails, or any other type of covering on natural nails (fingers and  toes).  Do not wear lotions, powders, or perfumes, or deodorant.  Do not shave 48 hours prior to surgery.  Men may shave face and neck.  Do not bring valuables to the hospital.  [facility] is not responsible for any belongings or valuables.  Contacts, dentures or bridgework may not be worn into surgery.  Leave your suitcase in the car.  After surgery it may be brought to your room.  For patients admitted to the hospital, discharge time will be determined by your treatment team.  Patients discharged the day of surgery will not be allowed to drive home and must have someone with them for 24 hours.Marland Kitchen    Special instructions:   DO NOT smoke tobacco or vape for 24 hours before your procedure.  Please read over the following fact sheets that you were given. Pain Booklet, Coughing and Deep Breathing, Blood Transfusion Information, Surgical Site Infection Prevention, Anesthesia Post-op Instructions, and Care and Recovery After Surgery        Total Laparoscopic Hysterectomy, Care After The following information offers guidance on how to care for yourself after your  procedure. Your health care provider may also give you more specific instructions. If you have problems or questions, contact your health care provider. What can I expect after the procedure? After the procedure, it is common to have: Pain, bruising, and numbness around your incisions. Tiredness (fatigue). Poor appetite. Less interest in sex. Vaginal discharge or bleeding. You will need to use a sanitary pad after this procedure. Feelings of sadness or other emotions. If your ovaries were also removed, it is also common to have symptoms of menopause, such as hot flashes, night sweats, and lack of sleep (insomnia). Follow these instructions at home: Medicines Take over-the-counter and prescription medicines only as told by your health care provider. Ask your health care provider if the medicine prescribed to you: Requires you to avoid driving or using machinery. Can cause constipation. You may need to take these actions to prevent or treat constipation: Drink enough fluid to keep your urine pale yellow. Take over-the-counter or prescription medicines. Eat foods that are high in fiber, such as beans, whole grains, and fresh fruits and vegetables. Limit foods that are high in fat and processed sugars, such as fried or sweet foods. Incision care  Follow instructions from your health care provider about how to take care of your incisions. Make sure you: Wash your hands with soap and  water for at least 20 seconds before and after you change your bandage (dressing). If soap and water are not available, use hand sanitizer. Change your dressing as told by your health care provider. Leave stitches (sutures), skin glue, or adhesive strips in place. These skin closures may need to stay in place for 2 weeks or longer. If adhesive strip edges start to loosen and curl up, you may trim the loose edges. Do not remove adhesive strips completely unless your health care provider tells you to do that. Check  your incision areas every day for signs of infection. Check for: More redness, swelling, or pain. Fluid or blood. Warmth. Pus or a bad smell. Activity  Rest as told by your health care provider. Avoid sitting for a long time without moving. Get up to take short walks every 1-2 hours. This is important to improve blood flow and breathing. Ask for help if you feel weak or unsteady. Return to your normal activities as told by your health care provider. Ask your health care provider what activities are safe for you. Do not lift anything that is heavier than 10 lb (4.5 kg), or the limit that you are told, for one month after surgery or until your health care provider says that it is safe. If you were given a sedative during the procedure, it can affect you for several hours. Do not drive or operate machinery until your health care provider says that it is safe. Lifestyle Do not use any products that contain nicotine or tobacco. These products include cigarettes, chewing tobacco, and vaping devices, such as e-cigarettes. These can delay healing after surgery. If you need help quitting, ask your health care provider. Do not drink alcohol until your health care provider approves. General instructions  Do not douche, use tampons, or have sex for at least 6 weeks, or as told by your health care provider. If you struggle with physical or emotional changes after your procedure, speak with your health care provider or a therapist. Do not take baths, swim, or use a hot tub until your health care provider approves. You may only be allowed to take showers for 2-3 weeks. Keep your dressing dry until your health care provider says it can be removed. Try to have someone at home with you for the first 1-2 weeks to help with your daily chores. Wear compression stockings as told by your health care provider. These stockings help to prevent blood clots and reduce swelling in your legs. Keep all follow-up visits.  This is important. Contact a health care provider if: You have any of these signs of infection: Chills or a fever. More redness, swelling, or pain around an incision. Fluid or blood coming from an incision. Warmth coming from an incision. Pus or a bad smell coming from an incision. An incision opens. You feel dizzy or light-headed. You have pain or bleeding when you urinate, or you are unable to urinate. You have abnormal vaginal discharge. You have pain that does not get better with medicine. Get help right away if: You have a fever and your symptoms suddenly get worse. You have severe abdominal pain. You have chest pain or shortness of breath. You faint. You have pain, swelling, or redness in your leg. You have heavy vaginal bleeding with blood clots, soaking through a sanitary pad in less than 1 hour. These symptoms may represent a serious problem that is an emergency. Do not wait to see if the symptoms will go away. Get  medical help right away. Call your local emergency services (911 in the [place] Do not drive yourself to the hospital. Summary After the procedure, it is common to have pain and bruising around your incisions. Do not take baths, swim, or use a hot tub until your health care provider approves. Do not lift anything that is heavier than 10 lb (4.5 kg), or the limit that you are told, for one month after surgery or until your health care provider says that it is safe. Tell your health care provider if you have any signs or symptoms of infection after the procedure. Get help right away if you have severe abdominal pain, chest pain, shortness of breath, or heavy bleeding from your vagina. This information is not intended to replace advice given to you by your health care provider. Make sure you discuss any questions you have with your health care provider. Document Revised: 05/08/2020 Document Reviewed: 05/09/2020 Elsevier Patient Education  2024 [employer] General  Anesthesia, Adult, Care After The following information offers guidance on how to care for yourself after your procedure. Your health care provider may also give you more specific instructions. If you have problems or questions, contact your health care provider. What can I expect after the procedure? After the procedure, it is common for people to: Have pain or discomfort at the IV site. Have nausea or vomiting. Have a sore throat or hoarseness. Have trouble concentrating. Feel cold or chills. Feel weak, sleepy, or tired (fatigue). Have soreness and body aches. These can affect parts of the body that were not involved in surgery. Follow these instructions at home: For the time period you were told by your health care provider:  Rest. Do not participate in activities where you could fall or become injured. Do not drive or use machinery. Do not drink alcohol. Do not take sleeping pills or medicines that cause drowsiness. Do not make important decisions or sign legal documents. Do not take care of children on your own. General instructions Drink enough fluid to keep your urine pale yellow. If you have sleep apnea, surgery and certain medicines can increase your risk for breathing problems. Follow instructions from your health care provider about wearing your sleep device: Anytime you are sleeping, including during daytime naps. While taking prescription pain medicines, sleeping medicines, or medicines that make you drowsy. Return to your normal activities as told by your health care provider. Ask your health care provider what activities are safe for you. Take over-the-counter and prescription medicines only as told by your health care provider. Do not use any products that contain nicotine or tobacco. These products include cigarettes, chewing tobacco, and vaping devices, such as e-cigarettes. These can delay incision healing after surgery. If you need help quitting, ask your health care  provider. Contact a health care provider if: You have nausea or vomiting that does not get better with medicine. You vomit every time you eat or drink. You have pain that does not get better with medicine. You cannot urinate or have bloody urine. You develop a skin rash. You have a fever. Get help right away if: You have trouble breathing. You have chest pain. You vomit blood. These symptoms may be an emergency. Get help right away. Call 911. Do not wait to see if the symptoms will go away. Do not drive yourself to the hospital. Summary After the procedure, it is common to have a sore throat, hoarseness, nausea, vomiting, or to feel weak, sleepy, or fatigue. For the  time period you were told by your health care provider, do not drive or use machinery. Get help right away if you have difficulty breathing, have chest pain, or vomit blood. These symptoms may be an emergency. This information is not intended to replace advice given to you by your health care provider. Make sure you discuss any questions you have with your health care provider. Document Revised: 12/04/2021 Document Reviewed: 12/04/2021 Elsevier Patient Education  2024 [employer] How to Use Chlorhexidine Before Surgery Chlorhexidine gluconate (CHG) is a germ-killing (antiseptic) solution that is used to clean the skin. It can get rid of the bacteria that normally live on the skin and can keep them away for about 24 hours. To clean your skin with CHG, you may be given: A CHG solution to use in the shower or as part of a sponge bath. A prepackaged cloth that contains CHG. Cleaning your skin with CHG may help lower the risk for infection: While you are staying in the intensive care unit of the hospital. If you have a vascular access, such as a central line, to provide short-term or long-term access to your veins. If you have a catheter to drain urine from your bladder. If you are on a ventilator. A ventilator is a machine  that helps you breathe by moving air in and out of your lungs. After surgery. What are the risks? Risks of using CHG include: A skin reaction. Hearing loss, if CHG gets in your ears and you have a perforated eardrum. Eye injury, if CHG gets in your eyes and is not rinsed out. The CHG product catching fire. Make sure that you avoid smoking and flames after applying CHG to your skin. Do not use CHG: If you have a chlorhexidine allergy or have previously reacted to chlorhexidine. On babies younger than 23 months of age. How to use CHG solution Use CHG only as told by your health care provider, and follow the instructions on the label. Use the full amount of CHG as directed. Usually, this is one bottle. During a shower Follow these steps when using CHG solution during a shower (unless your health care provider gives you different instructions): Start the shower. Use your normal soap and shampoo to wash your face and hair. Turn off the shower or move out of the shower stream. Pour the CHG onto a clean washcloth. Do not use any type of brush or rough-edged sponge. Starting at your neck, lather your body down to your toes. Make sure you follow these instructions: If you will be having surgery, pay special attention to the part of your body where you will be having surgery. Scrub this area for at least 1 minute. Do not use CHG on your head or face. If the solution gets into your ears or eyes, rinse them well with water. Avoid your genital area. Avoid any areas of skin that have broken skin, cuts, or scrapes. Scrub your back and under your arms. Make sure to wash skin folds. Let the lather sit on your skin for 1-2 minutes or as long as told by your health care provider. Thoroughly rinse your entire body in the shower. Make sure that all body creases and crevices are rinsed well. Dry off with a clean towel. Do not put any substances on your body afterward--such as powder, lotion, or perfume--unless  you are told to do so by your health care provider. Only use lotions that are recommended by the manufacturer. Put on clean clothes or  pajamas. If it is the night before your surgery, sleep in clean sheets.  During a sponge bath Follow these steps when using CHG solution during a sponge bath (unless your health care provider gives you different instructions): Use your normal soap and shampoo to wash your face and hair. Pour the CHG onto a clean washcloth. Starting at your neck, lather your body down to your toes. Make sure you follow these instructions: If you will be having surgery, pay special attention to the part of your body where you will be having surgery. Scrub this area for at least 1 minute. Do not use CHG on your head or face. If the solution gets into your ears or eyes, rinse them well with water. Avoid your genital area. Avoid any areas of skin that have broken skin, cuts, or scrapes. Scrub your back and under your arms. Make sure to wash skin folds. Let the lather sit on your skin for 1-2 minutes or as long as told by your health care provider. Using a different clean, wet washcloth, thoroughly rinse your entire body. Make sure that all body creases and crevices are rinsed well. Dry off with a clean towel. Do not put any substances on your body afterward--such as powder, lotion, or perfume--unless you are told to do so by your health care provider. Only use lotions that are recommended by the manufacturer. Put on clean clothes or pajamas. If it is the night before your surgery, sleep in clean sheets. How to use CHG prepackaged cloths Only use CHG cloths as told by your health care provider, and follow the instructions on the label. Use the CHG cloth on clean, dry skin. Do not use the CHG cloth on your head or face unless your health care provider tells you to. When washing with the CHG cloth: Avoid your genital area. Avoid any areas of skin that have broken skin, cuts, or  scrapes. Before surgery Follow these steps when using a CHG cloth to clean before surgery (unless your health care provider gives you different instructions): Using the CHG cloth, vigorously scrub the part of your body where you will be having surgery. Scrub using a back-and-forth motion for 3 minutes. The area on your body should be completely wet with CHG when you are done scrubbing. Do not rinse. Discard the cloth and let the area air-dry. Do not put any substances on the area afterward, such as powder, lotion, or perfume. Put on clean clothes or pajamas. If it is the night before your surgery, sleep in clean sheets.  For general bathing Follow these steps when using CHG cloths for general bathing (unless your health care provider gives you different instructions). Use a separate CHG cloth for each area of your body. Make sure you wash between any folds of skin and between your fingers and toes. Wash your body in the following order, switching to a new cloth after each step: The front of your neck, shoulders, and chest. Both of your arms, under your arms, and your hands. Your stomach and groin area, avoiding the genitals. Your right leg and foot. Your left leg and foot. The back of your neck, your back, and your buttocks. Do not rinse. Discard the cloth and let the area air-dry. Do not put any substances on your body afterward--such as powder, lotion, or perfume--unless you are told to do so by your health care provider. Only use lotions that are recommended by the manufacturer. Put on clean clothes or pajamas. Contact a  health care provider if: Your skin gets irritated after scrubbing. You have questions about using your solution or cloth. You swallow any chlorhexidine. Call your local poison control center ([phone] in the [place] Get help right away if: Your eyes itch badly, or they become very red or swollen. Your skin itches badly and is red or swollen. Your hearing  changes. You have trouble seeing. You have swelling or tingling in your mouth or throat. You have trouble breathing. These symptoms may represent a serious problem that is an emergency. Do not wait to see if the symptoms will go away. Get medical help right away. Call your local emergency services (911 in the [place] Do not drive yourself to the hospital. Summary Chlorhexidine gluconate (CHG) is a germ-killing (antiseptic) solution that is used to clean the skin. Cleaning your skin with CHG may help to lower your risk for infection. You may be given CHG to use for bathing. It may be in a bottle or in a prepackaged cloth to use on your skin. Carefully follow your health care provider's instructions and the instructions on the product label. Do not use CHG if you have a chlorhexidine allergy. Contact your health care provider if your skin gets irritated after scrubbing. This information is not intended to replace advice given to you by your health care provider. Make sure you discuss any questions you have with your health care provider. Document Revised: 01/04/2022 Document Reviewed: 11/17/2020 Elsevier Patient Education  2023 [employer].

## 2023-05-13 ENCOUNTER — Encounter (HOSPITAL_COMMUNITY): Payer: Self-pay

## 2023-05-13 ENCOUNTER — Other Ambulatory Visit: Payer: Self-pay

## 2023-05-13 ENCOUNTER — Encounter (HOSPITAL_COMMUNITY): Admission: RE | Admit: 2023-05-13 | Source: Ambulatory Visit

## 2023-05-13 VITALS — BP 117/69 | HR 92 | Temp 97.8°F | Resp 18 | Ht 65.0 in | Wt 179.9 lb

## 2023-05-13 DIAGNOSIS — R7303 Prediabetes: Secondary | ICD-10-CM | POA: Diagnosis not present

## 2023-05-13 DIAGNOSIS — Z01818 Encounter for other preprocedural examination: Secondary | ICD-10-CM | POA: Diagnosis present

## 2023-05-13 DIAGNOSIS — N941 Unspecified dyspareunia: Secondary | ICD-10-CM | POA: Diagnosis not present

## 2023-05-13 DIAGNOSIS — N920 Excessive and frequent menstruation with regular cycle: Secondary | ICD-10-CM

## 2023-05-13 DIAGNOSIS — N946 Dysmenorrhea, unspecified: Secondary | ICD-10-CM

## 2023-05-13 DIAGNOSIS — I10 Essential (primary) hypertension: Secondary | ICD-10-CM | POA: Diagnosis not present

## 2023-05-13 LAB — BASIC METABOLIC PANEL
Anion gap: 10 (ref 5–15)
BUN: 12 mg/dL (ref 6–20)
CO2: 24 mmol/L (ref 22–32)
Calcium: 8.5 mg/dL — ABNORMAL LOW (ref 8.9–10.3)
Chloride: 101 mmol/L (ref 98–111)
Creatinine, Ser: 0.75 mg/dL (ref 0.44–1.00)
GFR, Estimated: >60 mL/min (ref >60)
Glucose, Bld: 165 mg/dL — ABNORMAL HIGH (ref 70–99)
Potassium: 3.7 mmol/L (ref 3.5–5.1)
Sodium: 135 mmol/L (ref 135–145)

## 2023-05-13 LAB — CBC
HCT: 35.5 % — ABNORMAL LOW (ref 36.0–46.0)
Hemoglobin: 11.2 g/dL — ABNORMAL LOW (ref 12.0–15.0)
MCH: 27.7 pg (ref 26.0–34.0)
MCHC: 31.5 g/dL (ref 30.0–36.0)
MCV: 87.9 fL (ref 80.0–100.0)
Platelets: 306 10*3/uL (ref 150–400)
RBC: 4.04 MIL/uL (ref 3.87–5.11)
RDW: 12.6 % (ref 11.5–15.5)
WBC: 7.7 10*3/uL (ref 4.0–10.5)
nRBC: 0 % (ref 0.0–0.2)

## 2023-05-13 LAB — TYPE AND SCREEN
ABO/RH(D): O POS
Antibody Screen: NEGATIVE

## 2023-05-13 LAB — HEMOGLOBIN A1C
Hgb A1c MFr Bld: 5.6 % (ref 4.8–5.6)
Mean Plasma Glucose: 114.02 mg/dL

## 2023-05-13 LAB — PREGNANCY, URINE: Preg Test, Ur: NEGATIVE

## 2023-05-15 NOTE — H&P (Signed)
[facility] Obstetrics and Gynecology Attending History and Physical  Gabriella Schmidt is a 42 y.o. G1P0101 who presents for scheduled robotic assisted laparoscopic hysterectomy and bilateral salpingectomy, possible cystoscopy In review, she has been struggling with irregular menses and significant dysmenorrhea.  She is currently using Slynd which helps control her bleeding; however, she still notes considerable pelvic pain.  Notes dyspareunia as well as some discomfort with bowel movements.  She also notes occasional cramping with a full bladder.    She takes Tylenol and diclofenac regularly due to inflammatory issues.  Denies any abnormal vaginal discharge, fevers, chills, sweats, dysuria, nausea, vomiting, other GI or GU symptoms or other general symptoms. No acute complaints or concerns since her prior visit.  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   Hypertension    Pre-diabetes    Past Surgical History:  Procedure Laterality Date   TOOTH EXTRACTION     TUBAL LIGATION N/A 06/29/2020   Procedure: POST PARTUM TUBAL LIGATION;  Surgeon: Tereso Newcomer, MD;  Location: [facility] LD [facility]S;  Service: Gynecology;  Laterality: N/A;   OB History  Gravida Para Term Preterm AB Living  1 1   1   1   SAB IAB Ectopic Multiple Live Births        0 1    # Outcome Date GA Lbr Len/2nd Weight Sex Type Anes PTL Lv  1 Preterm 06/28/20 [redacted]w[redacted]d 15:31 / 03:38 2999 g F Vag-Vacuum EPI  LIV     Birth Comments: WNL  Patient denies any other pertinent gynecologic issues.   No current facility-administered medications on file prior to encounter.   Current Outpatient Medications on File Prior to Encounter  Medication Sig Dispense Refill   acetaminophen-codeine (TYLENOL #4) 300-60 MG tablet Take 1  tablet by mouth 2 (two) times daily. 60 tablet 2   albuterol (VENTOLIN HFA) 108 (90 Base) MCG/ACT inhaler Inhale 2 puffs into the lungs every 6 (six) hours as needed for wheezing or shortness of breath. 8 g 2   diclofenac (VOLTAREN) 50 MG EC tablet TAKE 1 TABLET(50 MG) BY MOUTH TWICE DAILY (Patient taking differently: Take 50 mg by mouth at bedtime.) 60 tablet 0   diphenhydrAMINE (BENADRYL) 25 MG tablet Take 50 mg by mouth at bedtime as needed for allergies or sleep.     Drospirenone (SLYND) 4 MG TABS Take 1 tablet (4 mg total) by mouth daily. (Patient taking differently: Take 1 tablet by mouth at bedtime.) 84 tablet 4   gabapentin (NEURONTIN) 300 MG capsule TAKE 1 CAPSULE(300 MG) BY MOUTH THREE TIMES DAILY (Patient taking differently: Take 300 mg by mouth See admin instructions. Take 300 mg twice daily, may take a third 300 mg dose as needed for pain) 90 capsule 1   pyridOXINE (VITAMIN B6) 100 MG tablet Take 100 mg by mouth daily.     Secukinumab (COSENTYX SENSOREADY PEN) 150 MG/ML SOAJ Inject 150mg  into the skin at Week 0, 1, 2, 3 4 mL 0   sertraline (ZOLOFT) 100 MG tablet TAKE 2 TABLETS(200 MG) BY MOUTH DAILY 180 tablet 1   traZODone (DESYREL) 150 MG tablet TAKE 1 TABLET(150 MG) BY MOUTH AT BEDTIME AS NEEDED FOR SLEEP (Patient taking differently: Take  150 mg by mouth at bedtime.) 90 tablet 0   Secukinumab (COSENTYX SENSOREADY PEN) 150 MG/ML SOAJ Inject 150mg  into the skin at 4 then every 4 weeks thereafter (Patient taking differently: Inject 150 mg as directed every 28 (twenty-eight) days.) 1 mL 2   Allergies  Allergen Reactions   Latex Rash    Social History:   reports that she quit smoking about 10 years ago. Her smoking use included cigarettes. She started smoking about 23 years ago. She has a 6.5 pack-year smoking history. She has been exposed to tobacco smoke. She has never used smokeless tobacco. She reports that she does not currently use alcohol. She reports that she does not use  drugs.  Family History  Problem Relation Age of Onset   Hypertension Mother    Skin cancer Mother    Hypertension Father    Cardiomyopathy Father    Lymphoma Father    Obesity Brother    Obesity Brother    Obesity Brother    Hypertension Maternal Grandmother    Hypertension Maternal Grandfather    Healthy Daughter     Review of Systems: Pertinent items noted in HPI and remainder of comprehensive ROS otherwise negative.  PHYSICAL EXAM: Last menstrual period 09/20/2022. BP 133/82   Pulse 91   Temp 98 F (36.7 C) (Oral)   Resp 20   Ht 5\' 5"  (1.651 m)   Wt 81.6 kg   LMP 09/20/2022 (Approximate)   SpO2 99%   BMI 29.94 kg/m   CONSTITUTIONAL: Well-developed, well-nourished female in no acute distress.  SKIN: Skin is warm and dry. No rash noted. Not diaphoretic. No erythema. No pallor. NEUROLOGIC: Alert and oriented to person, place, and time. Normal reflexes, muscle tone coordination. No cranial nerve deficit noted. PSYCHIATRIC: Normal mood and affect. Normal behavior. Normal judgment and thought content. CARDIOVASCULAR: Normal heart rate noted, regular rhythm RESPIRATORY: Effort and breath sounds normal, no problems with respiration noted ABDOMEN: Soft, nontender, nondistended. PELVIC: deferred MUSCULOSKELETAL: no calf tenderness bilaterally EXT: no edema bilaterally, normal pulses  Labs: Results for orders placed or performed during the hospital encounter of 05/13/23 (from the past 336 hour(s))  Hemoglobin A1c   Collection Time: 05/13/23  2:49 PM  Result Value Ref Range   Hgb A1c MFr Bld 5.6 4.8 - 5.6 %   Mean Plasma Glucose 114.02 mg/dL  CBC   Collection Time: 05/13/23  2:49 PM  Result Value Ref Range   WBC 7.7 4.0 - 10.5 K/uL   RBC 4.04 3.87 - 5.11 MIL/uL   Hemoglobin 11.2 (L) 12.0 - 15.0 g/dL   HCT 16.1 (L) 09.6 - 04.5 %   MCV 87.9 80.0 - 100.0 fL   MCH 27.7 26.0 - 34.0 pg   MCHC 31.5 30.0 - 36.0 g/dL   RDW 40.9 81.1 - 91.4 %   Platelets 306 150 - 400 K/uL    nRBC 0.0 0.0 - 0.2 %  Basic metabolic panel   Collection Time: 05/13/23  2:49 PM  Result Value Ref Range   Sodium 135 135 - 145 mmol/L   Potassium 3.7 3.5 - 5.1 mmol/L   Chloride 101 98 - 111 mmol/L   CO2 24 22 - 32 mmol/L   Glucose, Bld 165 (H) 70 - 99 mg/dL   BUN 12 6 - 20 mg/dL   Creatinine, Ser 7.82 0.44 - 1.00 mg/dL   Calcium 8.5 (L) 8.9 - 10.3 mg/dL   GFR, Estimated >95 >62 mL/min   Anion gap 10 5 - 15  Pregnancy, urine   Collection Time: 05/13/23  2:49 PM  Result Value Ref Range   Preg Test, Ur NEGATIVE NEGATIVE  Type and screen   Collection Time: 05/13/23  2:49 PM  Result Value Ref Range   ABO/RH(D) O POS    Antibody Screen NEG    Sample Expiration      05/16/2023,2359 Performed at [facility], [address] [place], [place] [address]     Imaging Studies: US PELVIC COMPLETE WITH TRANSVAGINAL  Result Date: 05/02/2023  .Marland Kitchenan Financial trader of Ultrasound Medicine Technical sales engineer) accredited practice Center for [facility] [address] [place] [address] Ordering Provider: Myna Hidalgo, DO                                                                                                                                   GYNECOLOGIC SONOGRAM Gabriella Schmidt is a 41 y.o. G1P0101 No LMP recorded. (Menstrual status: Oral contraceptives). She is here for a pelvic sonogram for pelvic pain. Uterus                      4.8 x 3 x 4.3 cm, Total uterine volume 33 cc,homogeneous anteverted uterus,WNL Endometrium          4.2 mm, symmetrical, WNL Right ovary             3.3 x 2.3 x 2.6 cm, normal Left ovary                2.5 x 2.2 x 1.8 cm, normal No free fluid Technician Comments: PELVIC US TA/TV: homogeneous anteverted uterus,WNL,EEC 4.2 mm,normal ovaries,ovaries appear mobile,no free fluid,no pain during ultrasound Chaperone [id number] Flora Lipps 04/29/2023 12:10 PM Clinical Impression and recommendations: I have reviewed the sonogram results above, combined with  the patient's current clinical course, below are my impressions and any appropriate recommendations for management based on the sonographic findings.  Normal uterine size and shape Normal endometrium Normal ovaries bilaterally. Normal findings, no acute abnormalities noted. Myna Hidalgo, DO Attending Obstetrician & Gynecologist, Faculty Practice Center for [facility], [facility]     Assessment: AUB Dysmenorrhea Pelvic pain  Plan: Robotic assisted laparoscopic hysterectomy, bilateral salpingectomy, possible cystoscopy -Ancef 2 g IV -IV Toradol 2 OR -NPO -LR @ 125cc/hr -SCDs to OR -Risk/benefits and alternatives reviewed with the patient including but not limited to risk of bleeding, infection and injury to surrounding organs.  Discussed potential risk for surgical complications requiring further surgical intervention.  Questions and concerns were addressed and pt desires to proceed  Myna Hidalgo, DO Attending Obstetrician & Gynecologist, [facility] for [facility], [facility]

## 2023-05-17 ENCOUNTER — Encounter (HOSPITAL_COMMUNITY)
Admission: RE | Disposition: A | Discharge status: Home / Self Care | Payer: Self-pay | Source: Ambulatory Visit | Attending: Obstetrics & Gynecology

## 2023-05-17 ENCOUNTER — Ambulatory Visit (HOSPITAL_BASED_OUTPATIENT_CLINIC_OR_DEPARTMENT_OTHER): Admitting: Certified Registered"

## 2023-05-17 ENCOUNTER — Ambulatory Visit (HOSPITAL_COMMUNITY): Admitting: Certified Registered"

## 2023-05-17 ENCOUNTER — Ambulatory Visit (HOSPITAL_COMMUNITY)
Admission: RE | Admit: 2023-05-17 | Discharge: 2023-05-17 | Disposition: A | Discharge status: Home / Self Care | Source: Ambulatory Visit | Attending: Obstetrics & Gynecology | Admitting: Obstetrics & Gynecology

## 2023-05-17 ENCOUNTER — Encounter (HOSPITAL_COMMUNITY): Payer: Self-pay | Admitting: Obstetrics & Gynecology

## 2023-05-17 DIAGNOSIS — N946 Dysmenorrhea, unspecified: Secondary | ICD-10-CM

## 2023-05-17 DIAGNOSIS — N941 Unspecified dyspareunia: Secondary | ICD-10-CM | POA: Diagnosis not present

## 2023-05-17 DIAGNOSIS — Z87891 Personal history of nicotine dependence: Secondary | ICD-10-CM | POA: Insufficient documentation

## 2023-05-17 DIAGNOSIS — I10 Essential (primary) hypertension: Secondary | ICD-10-CM | POA: Diagnosis not present

## 2023-05-17 DIAGNOSIS — R102 Pelvic and perineal pain: Secondary | ICD-10-CM

## 2023-05-17 DIAGNOSIS — N938 Other specified abnormal uterine and vaginal bleeding: Secondary | ICD-10-CM | POA: Diagnosis not present

## 2023-05-17 DIAGNOSIS — J45909 Unspecified asthma, uncomplicated: Secondary | ICD-10-CM | POA: Insufficient documentation

## 2023-05-17 DIAGNOSIS — N920 Excessive and frequent menstruation with regular cycle: Secondary | ICD-10-CM

## 2023-05-17 HISTORY — PX: ROBOTIC ASSISTED LAPAROSCOPIC HYSTERECTOMY AND SALPINGECTOMY: SHX6379

## 2023-05-17 SURGERY — XI ROBOTIC ASSISTED LAPAROSCOPIC HYSTERECTOMY AND SALPINGECTOMY
Anesthesia: General | Laterality: Bilateral

## 2023-05-17 MED ORDER — ONDANSETRON HCL 4 MG/2ML IJ SOLN: 4.0000 mg | Freq: Once | INTRAMUSCULAR | Status: DC | PRN

## 2023-05-17 MED ORDER — PROPOFOL 500 MG/50ML IV EMUL
INTRAVENOUS | Status: AC
  Filled 2023-05-17: qty 50

## 2023-05-17 MED ORDER — CHLORHEXIDINE GLUCONATE 0.12 % MT SOLN
OROMUCOSAL | Status: AC
  Filled 2023-05-17: qty 60

## 2023-05-17 MED ORDER — SCOPOLAMINE 1 MG/3DAYS TD PT72
1.0000 | MEDICATED_PATCH | Freq: Once | TRANSDERMAL | Status: DC
  Administered 2023-05-17: 1.5 mg via TRANSDERMAL

## 2023-05-17 MED ORDER — LIDOCAINE 2% (20 MG/ML) 5 ML SYRINGE
INTRAMUSCULAR | Status: DC | PRN
  Administered 2023-05-17: 100 mg via INTRAVENOUS

## 2023-05-17 MED ORDER — FENTANYL CITRATE (PF) 250 MCG/5ML IJ SOLN
INTRAMUSCULAR | Status: AC
  Filled 2023-05-17: qty 5

## 2023-05-17 MED ORDER — KETOROLAC TROMETHAMINE 10 MG PO TABS: 10.0000 mg | ORAL_TABLET | Freq: Three times a day (TID) | ORAL | 0 refills | Status: AC

## 2023-05-17 MED ORDER — ONDANSETRON HCL 4 MG/2ML IJ SOLN
INTRAMUSCULAR | Status: DC | PRN
Start: 2023-05-17 — End: 2023-05-17
  Administered 2023-05-17: 4 mg via INTRAVENOUS

## 2023-05-17 MED ORDER — LIDOCAINE HCL (PF) 2 % IJ SOLN
INTRAMUSCULAR | Status: AC
  Filled 2023-05-17: qty 5

## 2023-05-17 MED ORDER — PHENYLEPHRINE 80 MCG/ML (10ML) SYRINGE FOR IV PUSH (FOR BLOOD PRESSURE SUPPORT)
PREFILLED_SYRINGE | INTRAVENOUS | Status: AC
  Filled 2023-05-17: qty 10

## 2023-05-17 MED ORDER — MIDAZOLAM HCL 2 MG/2ML IJ SOLN
INTRAMUSCULAR | Status: AC
  Filled 2023-05-17: qty 2

## 2023-05-17 MED ORDER — PROPOFOL 10 MG/ML IV BOLUS
INTRAVENOUS | Status: DC | PRN
  Administered 2023-05-17: 200 mg via INTRAVENOUS

## 2023-05-17 MED ORDER — ONDANSETRON 4 MG PO TBDP: 4.0000 mg | ORAL_TABLET | Freq: Three times a day (TID) | ORAL | 0 refills | Status: DC | PRN

## 2023-05-17 MED ORDER — DEXMEDETOMIDINE HCL IN NACL 80 MCG/20ML IV SOLN
INTRAVENOUS | Status: DC | PRN
  Administered 2023-05-17 (×3): 8 ug via INTRAVENOUS

## 2023-05-17 MED ORDER — DEXAMETHASONE SODIUM PHOSPHATE 10 MG/ML IJ SOLN
INTRAMUSCULAR | Status: DC | PRN
  Administered 2023-05-17: 5 mg via INTRAVENOUS

## 2023-05-17 MED ORDER — ROCURONIUM BROMIDE 100 MG/10ML IV SOLN
INTRAVENOUS | Status: DC | PRN
  Administered 2023-05-17: 10 mg via INTRAVENOUS
  Administered 2023-05-17: 20 mg via INTRAVENOUS
  Administered 2023-05-17: 70 mg via INTRAVENOUS

## 2023-05-17 MED ORDER — FENTANYL CITRATE (PF) 250 MCG/5ML IJ SOLN
INTRAMUSCULAR | Status: DC | PRN
  Administered 2023-05-17 (×2): 50 ug via INTRAVENOUS
  Administered 2023-05-17: 100 ug via INTRAVENOUS

## 2023-05-17 MED ORDER — OXYCODONE HCL 5 MG PO TABS
5.0000 mg | ORAL_TABLET | ORAL | Status: AC
  Administered 2023-05-17: 5 mg via ORAL
  Filled 2023-05-17: qty 1

## 2023-05-17 MED ORDER — PHENYLEPHRINE 80 MCG/ML (10ML) SYRINGE FOR IV PUSH (FOR BLOOD PRESSURE SUPPORT)
PREFILLED_SYRINGE | INTRAVENOUS | Status: DC | PRN
  Administered 2023-05-17: 160 ug via INTRAVENOUS

## 2023-05-17 MED ORDER — DEXAMETHASONE SODIUM PHOSPHATE 10 MG/ML IJ SOLN
INTRAMUSCULAR | Status: AC
  Filled 2023-05-17: qty 1

## 2023-05-17 MED ORDER — BUPIVACAINE HCL 0.5 % IJ SOLN
INTRAMUSCULAR | Status: DC | PRN
  Administered 2023-05-17: 40 mL

## 2023-05-17 MED ORDER — PROPOFOL 10 MG/ML IV BOLUS
INTRAVENOUS | Status: AC
  Filled 2023-05-17: qty 20

## 2023-05-17 MED ORDER — KETOROLAC TROMETHAMINE 30 MG/ML IJ SOLN
30.0000 mg | INTRAMUSCULAR | Status: AC
  Administered 2023-05-17: 30 mg via INTRAVENOUS
  Filled 2023-05-17: qty 1

## 2023-05-17 MED ORDER — ROCURONIUM BROMIDE 10 MG/ML (PF) SYRINGE
PREFILLED_SYRINGE | INTRAVENOUS | Status: AC
  Filled 2023-05-17: qty 10

## 2023-05-17 MED ORDER — IBUPROFEN 600 MG PO TABS: 600.0000 mg | ORAL_TABLET | Freq: Four times a day (QID) | ORAL | 0 refills | Status: DC | PRN

## 2023-05-17 MED ORDER — HEMOSTATIC AGENTS (NO CHARGE) OPTIME
TOPICAL | Status: DC | PRN
  Administered 2023-05-17: 1 via TOPICAL

## 2023-05-17 MED ORDER — DEXMEDETOMIDINE HCL IN NACL 80 MCG/20ML IV SOLN
INTRAVENOUS | Status: AC
  Filled 2023-05-17: qty 20

## 2023-05-17 MED ORDER — MIDAZOLAM HCL 2 MG/2ML IJ SOLN
INTRAMUSCULAR | Status: DC | PRN
  Administered 2023-05-17: 2 mg via INTRAVENOUS

## 2023-05-17 MED ORDER — SODIUM CHLORIDE 0.9 % IR SOLN
Status: DC | PRN
  Administered 2023-05-17: 1000 mL

## 2023-05-17 MED ORDER — OXYCODONE HCL 5 MG/5ML PO SOLN: 5.0000 mg | Freq: Once | ORAL | Status: AC | PRN

## 2023-05-17 MED ORDER — ONDANSETRON HCL 4 MG/2ML IJ SOLN
INTRAMUSCULAR | Status: AC
  Filled 2023-05-17: qty 2

## 2023-05-17 MED ORDER — FENTANYL CITRATE PF 50 MCG/ML IJ SOSY: 25.0000 ug | PREFILLED_SYRINGE | INTRAMUSCULAR | Status: DC | PRN

## 2023-05-17 MED ORDER — OXYCODONE HCL 5 MG PO TABS
5.0000 mg | ORAL_TABLET | Freq: Once | ORAL | Status: AC | PRN
  Administered 2023-05-17: 5 mg via ORAL
  Filled 2023-05-17: qty 1

## 2023-05-17 MED ORDER — CHLORHEXIDINE GLUCONATE 0.12 % MT SOLN: 15.0000 mL | Freq: Once | OROMUCOSAL | Status: DC

## 2023-05-17 MED ORDER — OXYCODONE-ACETAMINOPHEN 7.5-325 MG PO TABS: 1.0000 | ORAL_TABLET | ORAL | 0 refills | Status: AC | PRN

## 2023-05-17 MED ORDER — PROPOFOL 500 MG/50ML IV EMUL
INTRAVENOUS | Status: DC | PRN
  Administered 2023-05-17 (×2): 25 ug/kg/min via INTRAVENOUS

## 2023-05-17 MED ORDER — SUGAMMADEX SODIUM 200 MG/2ML IV SOLN
INTRAVENOUS | Status: DC | PRN
  Administered 2023-05-17: 200 mg via INTRAVENOUS

## 2023-05-17 MED ORDER — SCOPOLAMINE 1 MG/3DAYS TD PT72
MEDICATED_PATCH | TRANSDERMAL | Status: AC
  Filled 2023-05-17: qty 1

## 2023-05-17 MED ORDER — BUPIVACAINE HCL (PF) 0.25 % IJ SOLN
INTRAMUSCULAR | Status: AC
  Filled 2023-05-17: qty 60

## 2023-05-17 MED ORDER — DOCUSATE SODIUM 100 MG PO CAPS: 100.0000 mg | ORAL_CAPSULE | Freq: Two times a day (BID) | ORAL | 0 refills | Status: AC

## 2023-05-17 MED ORDER — CEFAZOLIN SODIUM-DEXTROSE 2-4 GM/100ML-% IV SOLN
2.0000 g | INTRAVENOUS | Status: AC
  Administered 2023-05-17: 2 g via INTRAVENOUS
  Filled 2023-05-17: qty 100

## 2023-05-17 MED ORDER — LACTATED RINGERS IV SOLN: INTRAVENOUS | Status: DC

## 2023-05-17 MED ORDER — POVIDONE-IODINE 10 % EX SWAB: 2.0000 | Freq: Once | CUTANEOUS | Status: DC

## 2023-05-17 MED ORDER — ORAL CARE MOUTH RINSE: 15.0000 mL | Freq: Once | OROMUCOSAL | Status: DC

## 2023-05-17 SURGICAL SUPPLY — 80 items
ADH SKN CLS APL DERMABOND .7 (GAUZE/BANDAGES/DRESSINGS) ×1
APL ESCP 73.6OZ SRGCL (TIP) ×1
APL PRP STRL LF DISP 70% ISPRP (MISCELLANEOUS) ×1
BAG DRN RND TRDRP ANRFLXCHMBR (UROLOGICAL SUPPLIES) ×1
BAG URINE DRAIN 2000ML AR STRL (UROLOGICAL SUPPLIES) ×1 IMPLANT
BLADE SURG SZ11 CARB STEEL (BLADE) ×1 IMPLANT
CATH FOLEY 3WAY 30CC 16FR (CATHETERS) ×1 IMPLANT
CAUTERY HOOK MNPLR 1.6 DVNC XI (INSTRUMENTS) ×1 IMPLANT
CHLORAPREP W/TINT 26 (MISCELLANEOUS) ×1 IMPLANT
COVER LIGHT HANDLE STERIS (MISCELLANEOUS) ×2 IMPLANT
COVER MAYO STAND XLG (MISCELLANEOUS) ×1 IMPLANT
COVER TIP SHEARS 8 DVNC (MISCELLANEOUS) ×1 IMPLANT
DERMABOND ADVANCED .7 DNX12 (GAUZE/BANDAGES/DRESSINGS) ×1 IMPLANT
DILATOR CANAL MILEX (MISCELLANEOUS) IMPLANT
DRAPE ARM DVNC X/XI (DISPOSABLE) ×4 IMPLANT
DRAPE COLUMN DVNC XI (DISPOSABLE) ×1 IMPLANT
DRIVER NDL MEGA 8 DVNC XI (INSTRUMENTS) ×1 IMPLANT
DRIVER NDLE MEGA DVNC XI (INSTRUMENTS) ×1
ELECT REM PT RETURN 9FT ADLT (ELECTROSURGICAL) ×1
ELECTRODE REM PT RTRN 9FT ADLT (ELECTROSURGICAL) ×1 IMPLANT
FORCEPS BPLR FENES DVNC XI (FORCEP) ×1 IMPLANT
GAUZE 4X4 16PLY ~~LOC~~+RFID DBL (SPONGE) ×2 IMPLANT
GLOVE BIO SURGEON STRL SZ 6.5 (GLOVE) ×3 IMPLANT
GLOVE BIOGEL PI IND STRL 7.0 (GLOVE) ×7 IMPLANT
GOWN STRL REUS W/ TWL LRG LVL3 (GOWN DISPOSABLE) ×2 IMPLANT
GOWN STRL REUS W/TWL LRG LVL3 (GOWN DISPOSABLE) ×4 IMPLANT
GYRUS RUMI II 2.5CM BLUE (DISPOSABLE)
GYRUS RUMI II 3.5CM BLUE (DISPOSABLE) ×1
GYRUS RUMI II 4.0CM BLUE (DISPOSABLE)
IRRIGATION STRYKERFLOW (MISCELLANEOUS) IMPLANT
IRRIGATOR STRYKERFLOW (MISCELLANEOUS) ×1
KIT PINK PAD W/HEAD ARE REST (MISCELLANEOUS) ×1
KIT PINK PAD W/HEAD ARM REST (MISCELLANEOUS) ×1 IMPLANT
KIT TURNOVER CYSTO (KITS) ×1 IMPLANT
MANIFOLD NEPTUNE II (INSTRUMENTS) ×1 IMPLANT
MANIPULATOR VCARE LG CRV RETR (MISCELLANEOUS) IMPLANT
MANIPULATOR VCARE SML CRV RETR (MISCELLANEOUS) IMPLANT
MANIPULATOR VCARE STD CRV RETR (MISCELLANEOUS) IMPLANT
NDL HYPO 25X1 1.5 SAFETY (NEEDLE) ×1 IMPLANT
NDL INSUFFLATION 14GA 120MM (NEEDLE) IMPLANT
NEEDLE HYPO 25X1 1.5 SAFETY (NEEDLE) ×1
NEEDLE INSUFFLATION 14GA 120MM (NEEDLE) ×1
NS IRRIG 1000ML POUR BTL (IV SOLUTION) IMPLANT
NS IRRIG 500ML POUR BTL (IV SOLUTION) ×1 IMPLANT
OBTURATOR OPTICAL STND 8 DVNC (TROCAR) ×1
OBTURATOR OPTICALSTD 8 DVNC (TROCAR) ×1 IMPLANT
PACK PERI GYN (CUSTOM PROCEDURE TRAY) ×1 IMPLANT
POSITIONER HEAD 8X9X4 ADT (SOFTGOODS) ×1 IMPLANT
POWDER SURGICEL 3.0 GRAM (HEMOSTASIS) IMPLANT
RUMI II 3.0CM BLUE KOH-EFFICIE (DISPOSABLE) IMPLANT
RUMI II GYRUS 2.5CM BLUE (DISPOSABLE) IMPLANT
RUMI II GYRUS 3.5CM BLUE (DISPOSABLE) IMPLANT
RUMI II GYRUS 4.0CM BLUE (DISPOSABLE) IMPLANT
SCISSORS MNPLR CVD DVNC XI (INSTRUMENTS) IMPLANT
SEAL UNIV 5-12 XI (MISCELLANEOUS) ×3 IMPLANT
SEALER VESSEL EXT DVNC XI (MISCELLANEOUS) ×1 IMPLANT
SET BASIN LINEN APH (SET/KITS/TRAYS/PACK) ×1 IMPLANT
SET TRI-LUMEN FLTR TB AIRSEAL (TUBING) ×1 IMPLANT
SET TUBE DA VINCI INSUFFLATOR (TUBING) IMPLANT
SET TUBE IRRIG SUCTION NO TIP (IRRIGATION / IRRIGATOR) IMPLANT
SET TUBE SMOKE EVAC HIGH FLOW (TUBING) IMPLANT
SOL ANTI FOG 6CC (MISCELLANEOUS) ×1 IMPLANT
SPONGE T-LAP 18X18 ~~LOC~~+RFID (SPONGE) ×1 IMPLANT
SUT DVC VLOC 180 0 12IN GS21 (SUTURE) ×1
SUT MNCRL AB 4-0 PS2 18 (SUTURE) ×1 IMPLANT
SUT VIC AB 0 CT1 27 (SUTURE) ×2
SUT VIC AB 0 CT1 27XBRD ANBCTR (SUTURE) ×1 IMPLANT
SUT VIC AB 0 CT1 27XCR 8 STRN (SUTURE) IMPLANT
SUT VIC AB 0 CT1 36 (SUTURE) ×1 IMPLANT
SUTURE DVC VLC 180 0 12IN GS21 (SUTURE) ×1 IMPLANT
SYR 10ML LL (SYRINGE) ×1 IMPLANT
SYR 20ML LL LF (SYRINGE) ×1 IMPLANT
SYR 50ML LL SCALE MARK (SYRINGE) ×2 IMPLANT
SYR CONTROL 10ML LL (SYRINGE) ×2 IMPLANT
SYR TOOMEY 50ML (SYRINGE) ×1 IMPLANT
TAPE TRANSPORE STRL 2 31045 (GAUZE/BANDAGES/DRESSINGS) ×1 IMPLANT
TIP ENDOSCOPIC SURGICEL (TIP) IMPLANT
TIP UTERINE 5.1X6CM LAV DISP (MISCELLANEOUS) IMPLANT
TROCAR PORT AIRSEAL 8X120 (TROCAR) ×1 IMPLANT
WATER STERILE IRR 500ML POUR (IV SOLUTION) ×1 IMPLANT

## 2023-05-17 NOTE — Anesthesia Procedure Notes (Signed)
Procedure Name: Intubation Date/Time: 05/17/2023 7:42 AM  Performed by: Julian Reil, CRNAPre-anesthesia Checklist: Patient identified, Emergency Drugs available, Suction available and Patient being monitored Patient Re-evaluated:Patient Re-evaluated prior to induction Oxygen Delivery Method: Circle system utilized Preoxygenation: Pre-oxygenation with 100% oxygen Induction Type: IV induction Ventilation: Mask ventilation without difficulty Laryngoscope Size: Miller and 3 Grade View: Grade I Tube type: Oral Tube size: 7.5 mm Number of attempts: 1 Airway Equipment and Method: Stylet Placement Confirmation: ETT inserted through vocal cords under direct vision, positive ETCO2 and breath sounds checked- equal and bilateral Secured at: 21 cm Tube secured with: Tape Dental Injury: Teeth and Oropharynx as per pre-operative assessment

## 2023-05-17 NOTE — Discharge Instructions (Signed)
Post Operative Pain Med Plan:  >Take gabapentin 300 mg three times per day (since you already have this medication a prescription was not sent in)  >Take the Toradol every 8 hours for the first 3 days.  After the toradol is gone switch to Ibuprofen 600mg  every 6 hours as needed.  Ibuprofen, Toradol and Diclofenac are all the SAME medication-  DO NOT TAKE THEM TOGETHER.  TAKE ONLY ONE of them at a time.  >In between the Toradol, take Tylenol (over the counter) every 6 to 8 hours.  If the Tylenol does not seem strong enough.  A prescription of Percocet (oxycodone and tylenol) has been sent in to take over the next few days. While taking the Percocet, DO NOT TAKE the "T4s" that you have.  If this pain medication seems to be working better for you, then just take your usually prescription.  >Oxycodone will cause constipation, please be sure to take a stool softener (Colace) twice daily while taking this pain medication and/or continue this medication until your bowel regimen returns to normal  If possible try to take the Toradol or Ibuprofen with food to help avoid upsetting your stomach  >Use a heating pad as well as needed  >I have also sent a prescription for zofran (ondansetron) for nausea to take if needed over the first couple of days  >Be gentle with your diet the first few days, liquids and soft non spicy food, fruits are great  >Get up and move, no lifting or straining  HOME INSTRUCTIONS  Please note any unusual or excessive bleeding, pain, swelling. Mild dizziness or drowsiness are normal for about 24 hours after surgery.   Shower when comfortable  Restrictions: No driving for 24 hours or while taking pain medications.  Activity:  No heavy lifting (> 10 lbs), nothing in vagina (no tampons, douching, or intercourse) x 4 weeks; no tub baths for 4 weeks Vaginal spotting is expected but if your bleeding is heavy, period like,  please call the office   Incision: the bandaids will  fall off when they are ready to; you may clean your incision with mild soap and water but do not rub or scrub the incision site.  You may experience slight bloody drainage from your incision periodically.  This is normal.  If you experience a large amount of drainage or the incision opens, please call your physician who will likely direct you to the emergency department.  Diet:  You may return to your regular diet.  Do not eat large meals.  Eat small frequent meals throughout the day.  Continue to drink a good amount of water at least 6-8 glasses of water per day, hydration is very important for the healing process.  Pain Management: Follow instructions as above  Always take prescription pain medication with food.  Percocet may cause constipation, you may want to take a stool softener while taking this medication.  A prescription of colace has been sent in to take twice daily if needed while taking the oxycodone.  Be sure to drink plenty of fluids and increase your fiber to help with constipation.  Alcohol -- Avoid for 24 hours and while taking pain medications.  Nausea: Take sips of ginger ale or soda  Fever -- Call physician if temperature over 101 degrees  Follow up:  If you do not already have a follow up appointment scheduled, please call the office at [phone].  If you experience fever (a temperature greater than 100.4), pain unrelieved by  pain medication, shortness of breath, swelling of a single leg, or any other symptoms which are concerning to you please the office immediately.

## 2023-05-17 NOTE — Anesthesia Preprocedure Evaluation (Signed)
Anesthesia Evaluation  Patient identified by MRN, date of birth, ID band Patient awake    Reviewed: Allergy & Precautions, H&P , NPO status , Patient's Chart, lab work & pertinent test results, reviewed documented beta blocker date and time   Airway Mallampati: II  TM Distance: >3 FB Neck ROM: full    Dental no notable dental hx.    Pulmonary neg pulmonary ROS, asthma , former smoker   Pulmonary exam normal breath sounds clear to auscultation       Cardiovascular Exercise Tolerance: Good hypertension, negative cardio ROS  Rhythm:regular Rate:Normal     Neuro/Psych  PSYCHIATRIC DISORDERS Anxiety Depression    negative neurological ROS  negative psych ROS   GI/Hepatic negative GI ROS, Neg liver ROS,,,  Endo/Other  negative endocrine ROS    Renal/GU negative Renal ROS  negative genitourinary   Musculoskeletal   Abdominal   Peds  Hematology negative hematology ROS (+) Blood dyscrasia, anemia   Anesthesia Other Findings   Reproductive/Obstetrics negative OB ROS                             Anesthesia Physical Anesthesia Plan  ASA: 2  Anesthesia Plan: General and General ETT   Post-op Pain Management:    Induction:   PONV Risk Score and Plan: Ondansetron and Scopolamine patch - Pre-op  Airway Management Planned:   Additional Equipment:   Intra-op Plan:   Post-operative Plan:   Informed Consent: I have reviewed the patients History and Physical, chart, labs and discussed the procedure including the risks, benefits and alternatives for the proposed anesthesia with the patient or authorized representative who has indicated his/her understanding and acceptance.     Dental Advisory Given  Plan Discussed with: CRNA  Anesthesia Plan Comments:        Anesthesia Quick Evaluation

## 2023-05-17 NOTE — Op Note (Signed)
Pre Op Dx:   1) AUB/Heavy menstrual bleeding 2) Dysmenorrhea 3) Pelvic pain  Post Op Dx:   same  Procedure:   Robotic Assisted Total Laparoscopic Hysterectomy    Surgeon:  Dr. Myna Hidalgo  Anesthesia:  genearl   EBL:  70cc  IVF:  1300cc UOP:  250cc   Drains:  Foley catheter Specimen removed:  uterus Findings:  5cm anteverted uterus.  Fallopian tubes absent. Normal ovaries bilaterally.  Complications: None  Description of procedure:  After informed consent the patient was taken to the operating room and placed in dorsal supine position where general endotracheal anesthesia was administered and found to be adequate.  She was placed in dorsal lithotomy position with her arms tucked.  She was prepped and draped in the usual sterile fashion.  A timeout was called and the procedure confirmed.  A RUMI uterine manipulator with the Koh cup and a Foley catheter were placed.   An incision was made in the supraumbilical area and the Veress needle was inserted into the abdominal cavity without difficulty. Proper placement was confirmed using the saline drop test and opening pressure was 4 mmHg. A pneumoperitoneum was obtained. The laparoscopic trocar and the laparoscope were placed under direct visualization.  Three additional 7mm ports were placed on either side of the umbilicus and an additional 7mm port was placed in the left upper quadrant under direct visualization.  The patient was placed in Trendelenburg position and the [employer] robotic device was docked.  Next, attention was turned to the console where the hysterectomy was performed.  The right round ligament was divided along with the right ovarian ligament.  This process was repeated on the contralateral side.  The anterior leaflet of the broad ligament was divided to create a bladder flap. The right uterine artery and vein were skeletonized and desiccated superior to the Koh cup.  This process was repeated on the contralateral side.   Using the hook, an anterior colpotomy incision was made and continued along the ridge of the Koh cup.  With ~ 60% of the colpotomy completed, pneumoperitoneum was lost.  Troubleshooting was performed including checking trocars, tubes and other various device.  Since the pneumopertioneum could not be re-established.  The procedure proceeded vaginally.  Instruments and camera were removed.  The robot was undocked.  The case proceeded vaginally.    The RUMI device was removed.  On patient's right where the colpotomy had not been completed, an incision was made with the bovie.   The right uterosacral ligament was clamped cut and suture ligated.  The uterus was passed then off the field. The vaginal cuff was then closed in a running fashion using 0 vicryl and imbricated using two figure of eight 0-vicryl stitches.  Gown and gloves were then changed and attention was returned to the patient's abdomen.  The laparoscope was inserted.  Hemostasis confirmed. Arista powder was placed on the vaginal cuff and adnexa bilaterally.  Under direct visualization TAP block was completed under direct visualization using 10cc of 0.25% marcaine in each of four locations.  Airseal was deflated and trocars were removed.The skin was closed with 4-0 monocryl in subcuticular fashion with skin glue placed atop each port site.    The patient was returned to dorsal supine position, awakened and extubated in the OR having appeared to tolerate the procedure well.  All sponge, needle, and instrument counts were correct x 2 at the end of the case.  Pt tolerated procedure well and was taken to  recovery in stable condition.   Myna Hidalgo, DO Attending Obstetrician & Gynecologist, [facility] for [employer], [facility]

## 2023-05-17 NOTE — Transfer of Care (Signed)
Immediate Anesthesia Transfer of Care Note  Patient: Gabriella Schmidt  Procedure(s) Performed: XI ROBOTIC ASSISTED LAPAROSCOPIC HYSTERECTOMY AND SALPINGECTOMY (Bilateral)  Patient Location: PACU  Anesthesia Type:General  Level of Consciousness: awake  Airway & Oxygen Therapy: Patient Spontanous Breathing and Patient connected to face mask oxygen  Post-op Assessment: Report given to RN and Post -op Vital signs reviewed and stable  Post vital signs: Reviewed and stable  Last Vitals:  Vitals Value Taken Time  BP    Temp    Pulse    Resp    SpO2      Last Pain:  Vitals:   05/17/23 0639  TempSrc: Oral  PainSc: 3       Patients Stated Pain Goal: 5 (05/17/23 5956)  Complications: No notable events documented.

## 2023-05-18 ENCOUNTER — Encounter (HOSPITAL_COMMUNITY): Payer: Self-pay | Admitting: Obstetrics & Gynecology

## 2023-05-18 ENCOUNTER — Encounter: Payer: Self-pay | Admitting: Obstetrics & Gynecology

## 2023-05-18 NOTE — Anesthesia Postprocedure Evaluation (Signed)
Anesthesia Post Note  Patient: Tarnisha N Palmieri  Procedure(s) Performed: XI ROBOTIC ASSISTED LAPAROSCOPIC HYSTERECTOMY AND SALPINGECTOMY (Bilateral)  Patient location during evaluation: Phase II Anesthesia Type: General Level of consciousness: awake Pain management: pain level controlled Vital Signs Assessment: post-procedure vital signs reviewed and stable Respiratory status: spontaneous breathing and respiratory function stable Cardiovascular status: blood pressure returned to baseline and stable Postop Assessment: no headache and no apparent nausea or vomiting Anesthetic complications: no Comments: Late entry   No notable events documented.   Last Vitals:  Vitals:   05/17/23 1130 05/17/23 1201  BP: 127/79 121/74  Pulse: 77 82  Resp: (!) 24 20  Temp:  36.7 C  SpO2: 100% 100%    Last Pain:  Vitals:   05/17/23 1223  TempSrc:   PainSc: 8                  Windell Norfolk

## 2023-05-19 ENCOUNTER — Encounter (HOSPITAL_COMMUNITY)

## 2023-05-20 ENCOUNTER — Ambulatory Visit: Admitting: Family Medicine
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
## 2023-05-25 ENCOUNTER — Ambulatory Visit (INDEPENDENT_AMBULATORY_CARE_PROVIDER_SITE_OTHER): Admitting: Obstetrics & Gynecology

## 2023-05-25 ENCOUNTER — Encounter: Payer: Self-pay | Admitting: Obstetrics & Gynecology

## 2023-05-25 VITALS — BP 128/80 | HR 79 | Ht 65.0 in | Wt 181.0 lb

## 2023-05-25 DIAGNOSIS — Z90710 Acquired absence of both cervix and uterus: Secondary | ICD-10-CM

## 2023-05-25 NOTE — Progress Notes (Addendum)
    PostOp Visit Note  Gabriella Schmidt is a 42 y.o. G50P0101 female who presents for a postoperative visit. She is 1 week postop following a robotic-assisted hysterectomy completed on 8/27   Today she notes that she is doing well.  Pain controlled with just her T-4s Denies fever or chills.  Tolerating gen diet.  +Flatus, Regular BMs.   Noted some light spotting a few days ago, but no further bleeding.  Overall doing well and reports no acute complaints   Review of Systems Pertinent items are noted in HPI.    Objective:  BP 128/80 (BP Location: Left Arm, Patient Position: Sitting, Cuff Size: Normal)   Pulse 79   Ht 5\' 5"  (1.651 m)   Wt 181 lb (82.1 kg)   LMP 09/20/2022 (Approximate)   BMI 30.12 kg/m    Physical Examination:  GENERAL ASSESSMENT: well developed and well nourished SKIN: normal color, no lesions CHEST: normal air exchange, respiratory effort normal with no retractions HEART: regular rate and rhythm ABDOMEN: soft, mild distension, no rebound, no guarding INCISION: clean/dry/intact- healing appropriately EXTREMITY: no edema, no calf tenderness bilaterally PSYCH: mood appropriate, normal affect       Assessment:    1wk postop visit   Plan:   -meeting milestones appropriately -reviewed pelvic rest for an additional 7wks -f/u in 7wk for pelvic exam/postop visit  Myna Hidalgo, DO Attending Obstetrician & Gynecologist, [facility] [employer], [facility]

## 2023-05-26 ENCOUNTER — Other Ambulatory Visit: Payer: Self-pay | Admitting: Family Medicine

## 2023-05-26 DIAGNOSIS — G4700 Insomnia, unspecified: Secondary | ICD-10-CM

## 2023-05-26 LAB — SURGICAL PATHOLOGY

## 2023-05-29 ENCOUNTER — Encounter: Payer: Self-pay | Admitting: Family Medicine

## 2023-06-01 ENCOUNTER — Other Ambulatory Visit: Payer: Self-pay

## 2023-06-01 DIAGNOSIS — E038 Other specified hypothyroidism: Secondary | ICD-10-CM

## 2023-06-01 DIAGNOSIS — E785 Hyperlipidemia, unspecified: Secondary | ICD-10-CM

## 2023-06-01 DIAGNOSIS — I10 Essential (primary) hypertension: Secondary | ICD-10-CM

## 2023-06-01 DIAGNOSIS — E559 Vitamin D deficiency, unspecified: Secondary | ICD-10-CM

## 2023-06-01 DIAGNOSIS — R7301 Impaired fasting glucose: Secondary | ICD-10-CM

## 2023-06-01 NOTE — Telephone Encounter (Signed)
yes

## 2023-06-02 ENCOUNTER — Other Ambulatory Visit: Payer: Self-pay | Admitting: Family Medicine

## 2023-06-03 ENCOUNTER — Encounter: Payer: Self-pay | Admitting: Internal Medicine

## 2023-06-03 ENCOUNTER — Ambulatory Visit: Attending: Physical Medicine & Rehabilitation | Admitting: Internal Medicine

## 2023-06-03 VITALS — BP 106/69 | HR 80 | Resp 12 | Ht 65.0 in | Wt 180.0 lb

## 2023-06-03 DIAGNOSIS — M45A7 Non-radiographic axial spondyloarthritis of lumbosacral region: Secondary | ICD-10-CM | POA: Insufficient documentation

## 2023-06-03 DIAGNOSIS — Z79899 Other long term (current) drug therapy: Secondary | ICD-10-CM | POA: Diagnosis not present

## 2023-06-03 DIAGNOSIS — M461 Sacroiliitis, not elsewhere classified: Secondary | ICD-10-CM | POA: Diagnosis not present

## 2023-06-03 MED ORDER — COSENTYX SENSOREADY PEN 150 MG/ML ~~LOC~~ SOAJ
150.0000 mg | SUBCUTANEOUS | 2 refills | Status: DC
Start: 2023-06-03 — End: 2023-09-01

## 2023-06-04 LAB — CBC WITH DIFFERENTIAL/PLATELET
Basophils Absolute: 0.1 10*3/uL (ref 0.0–0.2)
Basos: 1 %
EOS (ABSOLUTE): 0.2 10*3/uL (ref 0.0–0.4)
Eos: 3 %
Hematocrit: 35.6 % (ref 34.0–46.6)
Hemoglobin: 11.5 g/dL (ref 11.1–15.9)
Immature Grans (Abs): 0 10*3/uL (ref 0.0–0.1)
Immature Granulocytes: 1 %
Lymphocytes Absolute: 1.9 10*3/uL (ref 0.7–3.1)
Lymphs: 28 %
MCH: 27 pg (ref 26.6–33.0)
MCHC: 32.3 g/dL (ref 31.5–35.7)
MCV: 84 fL (ref 79–97)
Monocytes Absolute: 0.4 10*3/uL (ref 0.1–0.9)
Monocytes: 7 %
Neutrophils Absolute: 4.1 10*3/uL (ref 1.4–7.0)
Neutrophils: 60 %
Platelets: 345 10*3/uL (ref 150–450)
RBC: 4.26 x10E6/uL (ref 3.77–5.28)
RDW: 12.1 % (ref 11.7–15.4)
WBC: 6.7 10*3/uL (ref 3.4–10.8)

## 2023-06-04 LAB — CMP14+EGFR
ALT: 32 IU/L (ref 0–32)
AST: 27 IU/L (ref 0–40)
Albumin: 4.1 g/dL (ref 3.9–4.9)
Alkaline Phosphatase: 92 IU/L (ref 44–121)
BUN/Creatinine Ratio: 16 (ref 9–23)
BUN: 12 mg/dL (ref 6–20)
Bilirubin Total: 0.4 mg/dL (ref 0.0–1.2)
CO2: 24 mmol/L (ref 20–29)
Calcium: 8.9 mg/dL (ref 8.7–10.2)
Chloride: 102 mmol/L (ref 96–106)
Creatinine, Ser: 0.74 mg/dL (ref 0.57–1.00)
Globulin, Total: 2.6 g/dL (ref 1.5–4.5)
Glucose: 98 mg/dL (ref 70–99)
Potassium: 4.2 mmol/L (ref 3.5–5.2)
Sodium: 139 mmol/L (ref 134–144)
Total Protein: 6.7 g/dL (ref 6.0–8.5)
eGFR: 105 mL/min/{1.73_m2} (ref >59)

## 2023-06-04 LAB — LIPID PANEL
Chol/HDL Ratio: 3.7 ratio (ref 0.0–4.4)
Cholesterol, Total: 188 mg/dL (ref 100–199)
HDL: 51 mg/dL (ref >39)
LDL Chol Calc (NIH): 116 mg/dL — ABNORMAL HIGH (ref 0–99)
Triglycerides: 116 mg/dL (ref 0–149)
VLDL Cholesterol Cal: 21 mg/dL (ref 5–40)

## 2023-06-04 LAB — TSH: TSH: 1.15 u[IU]/mL (ref 0.450–4.500)

## 2023-06-04 LAB — VITAMIN D 25 HYDROXY (VIT D DEFICIENCY, FRACTURES): Vit D, 25-Hydroxy: 53.8 ng/mL (ref 30.0–100.0)

## 2023-06-04 LAB — HEMOGLOBIN A1C
Est. average glucose Bld gHb Est-mCnc: 117 mg/dL
Hgb A1c MFr Bld: 5.7 % — ABNORMAL HIGH (ref 4.8–5.6)

## 2023-06-07 ENCOUNTER — Encounter: Payer: Self-pay | Admitting: Family Medicine

## 2023-06-07 ENCOUNTER — Ambulatory Visit (INDEPENDENT_AMBULATORY_CARE_PROVIDER_SITE_OTHER): Admitting: Family Medicine

## 2023-06-07 ENCOUNTER — Ambulatory Visit (HOSPITAL_COMMUNITY)
Admission: RE | Admit: 2023-06-07 | Discharge: 2023-06-07 | Disposition: A | Discharge status: Home / Self Care | Source: Ambulatory Visit | Attending: Family Medicine | Admitting: Family Medicine

## 2023-06-07 VITALS — BP 116/76 | HR 79 | Resp 16 | Ht 65.0 in | Wt 182.0 lb

## 2023-06-07 DIAGNOSIS — M79671 Pain in right foot: Secondary | ICD-10-CM | POA: Insufficient documentation

## 2023-06-07 DIAGNOSIS — M45A7 Non-radiographic axial spondyloarthritis of lumbosacral region: Secondary | ICD-10-CM

## 2023-06-07 NOTE — Patient Instructions (Addendum)
I appreciate the opportunity to provide care to you today!    Follow up:  4 months  Axial Spondyloarthritis Continue taking acetaminophen-codeine 300-60 mg every 4 hours as needed for pain relief. Continue taking gabapentin 300 mg three times daily as needed. I recommend alternating ice and heat applications to the lower back, along with rest and avoiding activities that aggravate your symptoms. If your symptoms do not improve within the next 2 days with these recommendations, I suggest following up with rheumatology.  Right Foot Pain Apply ice for 15 to 20 minutes, 3-4 times daily, to help reduce swelling. Avoid activities that worsen or trigger the pain. A referral to orthopedic surgery will be placed today for further evaluation and management. Continue taking Tylenol #4 for pain relief. An imaging study will be ordered to rule out any underlying pathology in the right foot.  Referrals today-  orthopedic surgery    Please continue to a heart-healthy diet and increase your physical activities. Try to exercise for at least five days a week.    It was a pleasure to see you and I look forward to continuing to work together on your health and well-being. Please do not hesitate to call the office if you need care or have questions about your care.  In case of emergency, please visit the Emergency Department for urgent care, or contact our clinic at [phone] to schedule an appointment. We're here to help you!   Have a wonderful day and week. With Gratitude, Gilmore Laroche MSN, FNP-BC

## 2023-06-07 NOTE — Progress Notes (Signed)
Established Patient Office Visit  Subjective:  Patient ID: Gabriella Schmidt, female    DOB: 1980/10/30  Age: 42 y.o. MRN: [id number]  CC:  Chief Complaint  Patient presents with   Back Pain    Lw back pain across whole lower back started Sunday, Rates pain 9/10, described as burning and sharp. Nothing seems to make it better. Unsure what makes it worse because its steady and constant no matter what she is doing     HPI Gabriella Schmidt is a 42 y.o. female presents with complains of low back pain.  Non-Radiographic axial spondyloarthritis of lumbar region: The patient is currently under the care of a rheumatologist and had a follow-up appointment on June 03, 2023, during which she administered her Cosentyx injection. Today, she presents with complaints of low back pain across her lumbar spine, which began on June 05, 2023. She rates the pain as 9 out of 10, describing it as burning and sharp. The patient reports that nothing seems to alleviate the pain and is uncertain about what exacerbates it. The pain is steady and constant, unresponsive to nonpharmacological interventions. She denies any bowel or bladder incontinence, fever, or unintentional weight loss.   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   Hypertension    Pre-diabetes     Past Surgical History:  Procedure Laterality Date   ROBOTIC ASSISTED LAPAROSCOPIC HYSTERECTOMY AND SALPINGECTOMY Bilateral 05/17/2023   Procedure: XI ROBOTIC ASSISTED LAPAROSCOPIC HYSTERECTOMY AND SALPINGECTOMY;  Surgeon: Myna Hidalgo, DO;  Location: [facility];  Service: Gynecology;  Laterality: Bilateral;   TOOTH EXTRACTION     TUBAL LIGATION N/A 06/29/2020   Procedure: POST PARTUM TUBAL LIGATION;  Surgeon: Tereso Newcomer, MD;   Location: [facility];  Service: Gynecology;  Laterality: N/A;    Family History  Problem Relation Age of Onset   Hypertension Mother    Skin cancer Mother    Hypertension Father    Cardiomyopathy Father    Lymphoma Father    Obesity Brother    Obesity Brother    Obesity Brother    Hypertension Maternal Grandmother    Hypertension Maternal Grandfather    Healthy Daughter     Social History   Socioeconomic History   Marital status: Married    Spouse name: Lashai Sturgell   Number of children: 1   Years of education: Not on file   Highest education level: Some college, no degree  Occupational History   Not on file  Tobacco Use   Smoking status: Former    Current packs/day: 0.00    Average packs/day: 0.5 packs/day for 13.0 years (6.5 ttl pk-yrs)    Types: Cigarettes    Start date: 01/2000    Quit date: 01/2013    Years since quitting: 10.3    Passive exposure: Past   Smokeless tobacco: Never  Vaping Use   Vaping status: Former   Quit date: 09/20/2018  Substance and Sexual Activity   Alcohol use: Not Currently   Drug use: Never   Sexual activity: Yes    Birth control/protection: Surgical    Comment: tubal// vasectomy  Other Topics Concern   Not on file  Social History Narrative   Not  on file   Social Determinants of Health   Financial Resource Strain: Low Risk  (01/14/2023)   Overall Financial Resource Strain (CARDIA)    Difficulty of Paying Living Expenses: Not hard at all  Food Insecurity: No Food Insecurity (01/14/2023)   Hunger Vital Sign    Worried About Running Out of Food in the Last Year: Never true    Ran Out of Food in the Last Year: Never true  Transportation Needs: No Transportation Needs (01/14/2023)   PRAPARE - Administrator, Civil Service (Medical): No    Lack of Transportation (Non-Medical): No  Physical Activity: Insufficiently Active (01/14/2023)   Exercise Vital Sign    Days of Exercise per Week: 3 days    Minutes of Exercise per  Session: 20 min  Stress: Stress Concern Present (01/14/2023)   [employer] of Occupational Health - Occupational Stress Questionnaire    Feeling of Stress : To some extent  Social Connections: Moderately Integrated (01/14/2023)   Social Connection and Isolation Panel [NHANES]    Frequency of Communication with Friends and Family: More than three times a week    Frequency of Social Gatherings with Friends and Family: Twice a week    Attends Religious Services: More than 4 times per year    Active Member of [employer] or Organizations: No    Attends Banker Meetings: Never    Marital Status: Married  Catering manager Violence: Not At Risk (12/14/2022)   Humiliation, Afraid, Rape, and Kick questionnaire    Fear of Current or Ex-Partner: No    Emotionally Abused: No    Physically Abused: No    Sexually Abused: No    Outpatient Medications Prior to Visit  Medication Sig Dispense Refill   acetaminophen-codeine (TYLENOL #4) 300-60 MG tablet Take 1 tablet by mouth every 4 (four) hours as needed for moderate pain.     albuterol (VENTOLIN HFA) 108 (90 Base) MCG/ACT inhaler Inhale 2 puffs into the lungs every 6 (six) hours as needed for wheezing or shortness of breath. 8 g 2   diclofenac (VOLTAREN) 50 MG EC tablet Take by mouth daily.     diphenhydrAMINE (BENADRYL) 25 MG tablet Take 50 mg by mouth at bedtime as needed for allergies or sleep.     gabapentin (NEURONTIN) 300 MG capsule TAKE 1 CAPSULE(300 MG) BY MOUTH THREE TIMES DAILY 90 capsule 1   ibuprofen (ADVIL) 600 MG tablet Take 1 tablet (600 mg total) by mouth every 6 (six) hours as needed. 30 tablet 0   ondansetron (ZOFRAN-ODT) 4 MG disintegrating tablet Take 1 tablet (4 mg total) by mouth every 8 (eight) hours as needed. 20 tablet 0   pyridOXINE (VITAMIN B6) 100 MG tablet Take 100 mg by mouth daily.     Secukinumab (COSENTYX SENSOREADY PEN) 150 MG/ML SOAJ Inject 1 mL (150 mg total) as directed every 28 (twenty-eight) days. 1 mL 2    sertraline (ZOLOFT) 100 MG tablet TAKE 2 TABLETS(200 MG) BY MOUTH DAILY 180 tablet 1   traZODone (DESYREL) 150 MG tablet TAKE 1 TABLET(150 MG) BY MOUTH AT BEDTIME AS NEEDED FOR SLEEP 90 tablet 0   Vitamin D, Ergocalciferol, (DRISDOL) 1.25 MG (50000 UNIT) CAPS capsule TAKE 1 CAPSULE BY MOUTH EVERY 7 DAYS 10 capsule 2   No facility-administered medications prior to visit.    Allergies  Allergen Reactions   Latex Rash    ROS Review of Systems  Constitutional:  Negative for chills and fever.  Eyes:  Negative  for visual disturbance.  Respiratory:  Negative for chest tightness and shortness of breath.   Musculoskeletal:  Positive for back pain.       Right foot pain  Neurological:  Negative for dizziness and headaches.      Objective:    Physical Exam HENT:     Head: Normocephalic.     Mouth/Throat:     Mouth: Mucous membranes are moist.  Cardiovascular:     Rate and Rhythm: Normal rate.     Heart sounds: Normal heart sounds.  Pulmonary:     Effort: Pulmonary effort is normal.     Breath sounds: Normal breath sounds.  Musculoskeletal:     Lumbar back: Positive right straight leg raise test and positive left straight leg raise test.  Neurological:     Mental Status: She is alert.     BP 116/76   Pulse 79   Resp 16   Ht 5\' 5"  (1.651 m)   Wt 182 lb (82.6 kg)   LMP 09/20/2022 (Approximate)   SpO2 97%   BMI 30.29 kg/m  Wt Readings from Last 3 Encounters:  06/07/23 182 lb (82.6 kg)  06/03/23 180 lb (81.6 kg)  05/25/23 181 lb (82.1 kg)    Lab Results  Component Value Date   TSH 1.150 06/03/2023   Lab Results  Component Value Date   WBC 6.7 06/03/2023   HGB 11.5 06/03/2023   HCT 35.6 06/03/2023   MCV 84 06/03/2023   PLT 345 06/03/2023   Lab Results  Component Value Date   NA 139 06/03/2023   K 4.2 06/03/2023   CO2 24 06/03/2023   GLUCOSE 98 06/03/2023   BUN 12 06/03/2023   CREATININE 0.74 06/03/2023   BILITOT 0.4 06/03/2023   ALKPHOS 92 06/03/2023    AST 27 06/03/2023   ALT 32 06/03/2023   PROT 6.7 06/03/2023   ALBUMIN 4.1 06/03/2023   CALCIUM 8.9 06/03/2023   ANIONGAP 10 05/13/2023   EGFR 105 06/03/2023   Lab Results  Component Value Date   CHOL 188 06/03/2023   Lab Results  Component Value Date   HDL 51 06/03/2023   Lab Results  Component Value Date   LDLCALC 116 (H) 06/03/2023   Lab Results  Component Value Date   TRIG 116 06/03/2023   Lab Results  Component Value Date   CHOLHDL 3.7 06/03/2023   Lab Results  Component Value Date   HGBA1C 5.7 (H) 06/03/2023      Assessment & Plan:  Right foot pain Assessment & Plan: The patient reports that the onset of her symptoms occurred a month ago. She describes the pain as a dull, achy sensation that can also feel sharp, and she rates the pain as 7 out of 10. She notes that the discomfort worsens throughout the day.  Apply ice for 15 to 20 minutes, 3-4 times daily, to help reduce swelling. Avoid activities that worsen or trigger the pain. A referral to orthopedic surgery will be placed today for further evaluation and management. Continue taking Tylenol #4 for pain relief. An imaging study will be ordered to rule out any underlying pathology in the right foot.  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 Continue taking gabapentin 300 mg three times daily as needed. I recommend alternating ice and heat applications to the lower back, along with  rest and avoiding activities that aggravate your symptoms. If your symptoms do not improve within the next 2 days with these recommendations, I suggest following up with rheumatology.   Note: This chart has been completed using Engineer, civil (consulting) software, and while attempts have been made to ensure accuracy, certain words and phrases may not be  transcribed as intended.    Follow-up: Return in about 4 months (around 10/07/2023).   Gilmore Laroche, FNP

## 2023-06-08 ENCOUNTER — Encounter: Payer: Self-pay | Admitting: Sports Medicine

## 2023-06-09 ENCOUNTER — Other Ambulatory Visit: Payer: Self-pay | Admitting: Physical Medicine and Rehabilitation

## 2023-06-10 ENCOUNTER — Ambulatory Visit (INDEPENDENT_AMBULATORY_CARE_PROVIDER_SITE_OTHER): Admitting: Physical Medicine and Rehabilitation

## 2023-06-10 ENCOUNTER — Encounter: Payer: Self-pay | Admitting: Physical Medicine and Rehabilitation

## 2023-06-10 VITALS — BP 110/72 | HR 76

## 2023-06-10 DIAGNOSIS — M7918 Myalgia, other site: Secondary | ICD-10-CM

## 2023-06-10 DIAGNOSIS — G894 Chronic pain syndrome: Secondary | ICD-10-CM | POA: Diagnosis not present

## 2023-06-10 DIAGNOSIS — S39012A Strain of muscle, fascia and tendon of lower back, initial encounter: Secondary | ICD-10-CM | POA: Diagnosis not present

## 2023-06-10 DIAGNOSIS — M79671 Pain in right foot: Secondary | ICD-10-CM | POA: Insufficient documentation

## 2023-06-10 MED ORDER — PREDNISONE 50 MG PO TABS: 50.0000 mg | ORAL_TABLET | Freq: Every day | ORAL | 0 refills | Status: DC

## 2023-06-10 MED ORDER — CYCLOBENZAPRINE HCL 10 MG PO TABS: 10.0000 mg | ORAL_TABLET | Freq: Three times a day (TID) | ORAL | 0 refills | Status: DC | PRN

## 2023-06-10 NOTE — Progress Notes (Unsigned)
Gabriella Schmidt - 42 y.o. female MRN [id number]  Date of birth: 06/29/1981  Office Visit Note: Visit Date: 06/10/2023 PCP: Gilmore Laroche, FNP Referred by: Gilmore Laroche, FNP  Subjective: Chief Complaint  Patient presents with  . Lower Back - Pain   HPI: Gabriella Schmidt is a 42 y.o. female who comes in today for evaluation of acute on chronic bilateral lower back pain radiating to anterolateral thighs, onset Sunday after waking up. She is currently being treated by our partner Dr. Madelyn Brunner. Recent hysterectomy on 05/17/2023. She is currently on pelvic rest post surgery. Her pain worsens with sitting, standing and walking. She describes pain as sharp and burning sensation, currently rates as 8 out of 10. No relief of pain with heat/ice, rest and medications. Lumbar MRI imaging from 2023 shows mild degenerative changes, no significant neural impingement/central stenosis. We have seen her in the past for both lower lumbar and sacroiliac pain. She underwent left sacroiliac joint injection in our office on 05/25/2022, bilateral L5-S1 facet joint injections on 06/02/2023, she is unable to recall if these injections were beneficial in alleviating her pain. She is currently being treated for her chronic pain by Dr. Sharyne Peach with [facility], she is prescribed Tylenol #4. She reports pain came on suddenly and has not improved since onset. Patient denies focal weakness, numbness and tingling. No recent trauma or falls.   Review of Systems  Musculoskeletal:  Positive for back pain.  Neurological:  Negative for tingling, sensory change, focal weakness and weakness.  All other systems reviewed and are negative.  Otherwise per HPI.  Assessment & Plan: Visit Diagnoses:    ICD-10-CM   1. Strain of lumbar region, initial encounter  S39.012A     2. Myofascial pain syndrome  M79.18     3. Chronic pain syndrome  G89.4        Plan: Findings:  Acute on chronic bilateral lower  back pain radiating to anterolateral thighs. Patient continues to have severe pain despite good conservative therapies such as heat/ice, rest and medications. She continues with Tylenol #4 prescribed by Dr. Wynn Banker. Patients clinical presentation and exam are consistent with lumbar strain. Lumbar MRI imaging from 2023 does not show any significant neural impingement/central stenosis. I explained to her that recent inactivity and pelvic rest could lead to exacerbation of her pain. I discussed treatment plan in detail today, can take up to 6-8 weeks to heal from myofascial strain, prescribed short course of oral Prednisone and Flexeril. She can continue chronic pain management with Dr. Wynn Banker, follow up with Dr. Shon Baton as needed. No red flag symptoms noted upon exam today.     Meds & Orders:  Meds ordered this encounter  Medications  . predniSONE (DELTASONE) 50 MG tablet    Sig: Take 1 tablet (50 mg total) by mouth daily with breakfast. Take until completed.    Dispense:  5 tablet    Refill:  0  . cyclobenzaprine (FLEXERIL) 10 MG tablet    Sig: Take 1 tablet (10 mg total) by mouth 3 (three) times daily as needed for muscle spasms.    Dispense:  60 tablet    Refill:  0   No orders of the defined types were placed in this encounter.   Follow-up: Return for follow up with Dr. Shon Baton as needed.   Procedures: No procedures performed      Clinical History: No specialty comments available.   She reports that she quit smoking about  10 years ago. Her smoking use included cigarettes. She started smoking about 23 years ago. She has a 6.5 pack-year smoking history. She has been exposed to tobacco smoke. She has never used smokeless tobacco.  Recent Labs    01/19/23 1056 05/13/23 1449 06/03/23 1019  HGBA1C 5.9* 5.6 5.7*    Objective:  VS:  HT:    WT:   BMI:     BP:110/72  HR:76bpm  TEMP: ( )  RESP:  Physical Exam Vitals and nursing note reviewed.  HENT:     Head: Normocephalic and  atraumatic.     Right Ear: External ear normal.     Left Ear: External ear normal.     Nose: Nose normal.     Mouth/Throat:     Mouth: Mucous membranes are moist.  Eyes:     Extraocular Movements: Extraocular movements intact.  Cardiovascular:     Rate and Rhythm: Normal rate.     Pulses: Normal pulses.  Pulmonary:     Effort: Pulmonary effort is normal.  Abdominal:     General: Abdomen is flat. There is no distension.  Musculoskeletal:        General: Tenderness present.     Cervical back: Normal range of motion.     Comments: Patient rises from seated position to standing without difficulty. Good lumbar range of motion. No pain noted with facet loading. 5/5 strength noted with bilateral hip flexion, knee flexion/extension, ankle dorsiflexion/plantarflexion and EHL. No clonus noted bilaterally. No pain upon palpation of greater trochanters. No pain with internal/external rotation of bilateral hips. Sensation intact bilaterally. Tenderness noted to bilateral lumbar paraspinal regions. Negative slump test bilaterally. Ambulates without aid, gait steady.       Skin:    General: Skin is warm and dry.     Capillary Refill: Capillary refill takes less than 2 seconds.  Neurological:     General: No focal deficit present.     Mental Status: She is alert and oriented to person, place, and time.  Psychiatric:        Mood and Affect: Mood normal.        Behavior: Behavior normal.    Ortho Exam  Imaging: No results found.  Past Medical/Family/Surgical/Social History: Medications & Allergies reviewed per EMR, new medications updated. Patient Active Problem List   Diagnosis Date Noted  . Right foot pain 06/10/2023  . Dysmenorrhea 05/17/2023  . Trochanteric bursitis of both hips 03/22/2023  . Encounter for gynecological examination with Papanicolaou smear of cervix 12/14/2022  . High risk medication use 09/28/2022  . 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 Bilateral sacroiliitis (HCC) 07/27/2022  . CRP elevated 07/27/2022  . Positive ANA (antinuclear antibody) 07/27/2022  . Pain of both breasts 07/07/2022  . Pregnancy examination or test, negative result 07/07/2022  . Prolonged periods 07/07/2022  . Menorrhagia with regular cycle 03/31/2022  . Uterine cramping 03/31/2022  . Dyspareunia, female 03/17/2022  . Urinary incontinence 03/17/2022  . Hematuria 03/17/2022  . Overweight (BMI 25.0-29.9) 01/05/2022  . 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  . School health examination 12/10/2021  . Weight gain 11/23/2021  . Fatigue 11/23/2021  . Nausea 11/23/2021  . Breast tenderness 11/23/2021  . Mass of upper outer quadrant of left breast 11/23/2021  . Mass of lower inner quadrant of right breast 11/23/2021  . Proteinuria 11/23/2021  . Urinary frequency 11/23/2021  . Pregnancy test negative 11/23/2021  . Anemia 09/02/2021  . COVID-19 04/17/2021  . Insomnia  01/14/2021  . Back pain 01/14/2021  . Liver function test abnormality 10/06/2020  . Hyperlipidemia 10/06/2020  . Dizziness 10/06/2020  . Degenerative disc disease at L5-S1 level 10/06/2020  . Asthma 09/01/2020  . Chronic hypertension 01/31/2020  . Depression with anxiety 01/31/2020   Past Medical History:  Diagnosis Date  . Anxiety   . Arthritis   . Asthma    Phreesia 09/01/2020  . Degenerative cervical disc   . Depression    Phreesia 09/01/2020  . Dizziness   . Edema   . Elevated hemoglobin A1c   . 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  . Hypertension   . Pre-diabetes    Family History  Problem Relation Age of Onset  . Hypertension Mother   . Skin cancer Mother   . Hypertension Father   . Cardiomyopathy Father   . Lymphoma Father   . Obesity Brother   . Obesity Brother   . Obesity Brother   . Hypertension Maternal Grandmother   . Hypertension Maternal Grandfather   . Healthy Daughter    Past Surgical History:  Procedure Laterality Date  .  ROBOTIC ASSISTED LAPAROSCOPIC HYSTERECTOMY AND SALPINGECTOMY Bilateral 05/17/2023   Procedure: XI ROBOTIC ASSISTED LAPAROSCOPIC HYSTERECTOMY AND SALPINGECTOMY;  Surgeon: Myna Hidalgo, DO;  Location: [facility];  Service: Gynecology;  Laterality: Bilateral;  . TOOTH EXTRACTION    . TUBAL LIGATION N/A 06/29/2020   Procedure: POST PARTUM TUBAL LIGATION;  Surgeon: Tereso Newcomer, MD;  Location: [facility];  Service: Gynecology;  Laterality: N/A;   Social History   Occupational History  . Not on file  Tobacco Use  . Smoking status: Former    Current packs/day: 0.00    Average packs/day: 0.5 packs/day for 13.0 years (6.5 ttl pk-yrs)    Types: Cigarettes    Start date: 01/2000    Quit date: 01/2013    Years since quitting: 10.4    Passive exposure: Past  . Smokeless tobacco: Never  Vaping Use  . Vaping status: Former  . Quit date: 09/20/2018  Substance and Sexual Activity  . Alcohol use: Not Currently  . Drug use: Never  . Sexual activity: Yes    Birth control/protection: Surgical    Comment: tubal// vasectomy

## 2023-06-10 NOTE — Assessment & Plan Note (Addendum)
The patient reports that the onset of her symptoms occurred a month ago. She describes the pain as a dull, achy sensation that can also feel sharp, and she rates the pain as 7 out of 10. She notes that the discomfort worsens throughout the day.  Apply ice for 15 to 20 minutes, 3-4 times daily, to help reduce swelling. Avoid activities that worsen or trigger the pain. A referral to orthopedic surgery will be placed today for further evaluation and management. Continue taking Tylenol #4 for pain relief. An imaging study will be ordered to rule out any underlying pathology in the right foot.

## 2023-06-10 NOTE — Assessment & Plan Note (Signed)
Continue taking acetaminophen-codeine 300-60 mg every 4 hours as needed for pain relief. Continue taking gabapentin 300 mg three times daily as needed. I recommend alternating ice and heat applications to the lower back, along with rest and avoiding activities that aggravate your symptoms. If your symptoms do not improve within the next 2 days with these recommendations, I suggest following up with rheumatology.

## 2023-06-21 ENCOUNTER — Encounter: Attending: Physical Medicine & Rehabilitation | Admitting: Physical Medicine & Rehabilitation

## 2023-06-21 ENCOUNTER — Encounter: Payer: Self-pay | Admitting: Family Medicine

## 2023-06-21 ENCOUNTER — Encounter: Payer: Self-pay | Admitting: Physical Medicine & Rehabilitation

## 2023-06-21 VITALS — BP 110/74 | HR 84 | Ht 65.0 in | Wt 182.0 lb

## 2023-06-21 DIAGNOSIS — M7062 Trochanteric bursitis, left hip: Secondary | ICD-10-CM | POA: Diagnosis present

## 2023-06-21 DIAGNOSIS — M7061 Trochanteric bursitis, right hip: Secondary | ICD-10-CM | POA: Insufficient documentation

## 2023-06-21 MED ORDER — GABAPENTIN 300 MG PO CAPS: 300.0000 mg | ORAL_CAPSULE | Freq: Two times a day (BID) | ORAL | 1 refills | Status: DC

## 2023-06-21 MED ORDER — ACETAMINOPHEN-CODEINE 300-60 MG PO TABS: 1.0000 | ORAL_TABLET | ORAL | 2 refills | Status: DC | PRN

## 2023-06-21 NOTE — Progress Notes (Signed)
Subjective:    Patient ID: Gabriella Schmidt, female    DOB: [birth year], 42 y.o.   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  She was referred to physical therapy for the troch bursitis and if she did not improve the plan was to do troch bursa injection Was getting PT and it was helping but had a hysterectomy and is awaiting GYN clearance to resume PT  She remains on Tylenol No. 4 1 tablet twice a day for chronic axial low back pain. She is on Cosentyx injection every 28 days.Just starting this  an dwas told by Rheum it may take another couple months to take effect  Off prednisone now  Ran out of gabapentin 300mg  po TID- leg pain  Ran out of T#4 - back and hip pain    Pain Inventory Average Pain 6 Pain Right Now 5 My pain is constant, sharp, burning, and stabbing  In the last 24 hours, has pain interfered with the following? General activity 4 Relation with others 0 Enjoyment of life 0 What TIME of day is your pain at its worst? morning , evening, and night Sleep (in general) Fair  Pain is worse with: walking, bending, sitting, inactivity, standing, and some activites Pain improves with: rest, therapy/exercise, and medication Relief from Meds: 5  Family History  Problem Relation Age of Onset   Hypertension Mother    Skin cancer Mother    Hypertension Father    Cardiomyopathy Father    Lymphoma Father    Obesity Brother    Obesity Brother    Obesity Brother    Hypertension Maternal Grandmother    Hypertension Maternal Grandfather    Healthy Daughter    Social History   Socioeconomic History   Marital status: Married    Spouse name: Tynesha Febles   Number of children: 1   Years of education: Not on file   Highest education level: Some college, no degree  Occupational History   Not on file  Tobacco Use   Smoking status: Former    Current packs/day: 0.00    Average  packs/day: 0.5 packs/day for 13.0 years (6.5 ttl pk-yrs)    Types: Cigarettes    Start date: 01/2000    Quit date: 01/2013    Years since quitting: 10.4    Passive exposure: Past   Smokeless tobacco: Never  Vaping Use   Vaping status: Former   Quit date: 09/20/2018  Substance and Sexual Activity   Alcohol use: Not Currently   Drug use: Never   Sexual activity: Yes    Birth control/protection: Surgical    Comment: tubal// vasectomy  Other Topics Concern   Not on file  Social History Narrative   Not on file   Social Determinants of Health   Financial Resource Strain: Low Risk  (01/14/2023)   Overall Financial Resource Strain (CARDIA)    Difficulty of Paying Living Expenses: Not hard at all  Food Insecurity: No Food Insecurity (01/14/2023)   Hunger Vital Sign    Worried About Running Out of Food in the Last Year: Never true    Ran Out of Food in the Last Year: Never true  Transportation Needs: No Transportation Needs (01/14/2023)   PRAPARE - Administrator, Civil Service (Medical): No    Lack of Transportation (Non-Medical): No  Physical Activity: Insufficiently Active (01/14/2023)   Exercise Vital Sign    Days of  Exercise per Week: 3 days    Minutes of Exercise per Session: 20 min  Stress: Stress Concern Present (01/14/2023)   [employer] of Occupational Health - Occupational Stress Questionnaire    Feeling of Stress : To some extent  Social Connections: Moderately Integrated (01/14/2023)   Social Connection and Isolation Panel [NHANES]    Frequency of Communication with Friends and Family: More than three times a week    Frequency of Social Gatherings with Friends and Family: Twice a week    Attends Religious Services: More than 4 times per year    Active Member of [employer] or Organizations: No    Attends Banker Meetings: Never    Marital Status: Married   Past Surgical History:  Procedure Laterality Date   ROBOTIC ASSISTED LAPAROSCOPIC  HYSTERECTOMY AND SALPINGECTOMY Bilateral 05/17/2023   Procedure: XI ROBOTIC ASSISTED LAPAROSCOPIC HYSTERECTOMY AND SALPINGECTOMY;  Surgeon: Myna Hidalgo, DO;  Location: [facility];  Service: Gynecology;  Laterality: Bilateral;   TOOTH EXTRACTION     TUBAL LIGATION N/A 06/29/2020   Procedure: POST PARTUM TUBAL LIGATION;  Surgeon: Tereso Newcomer, MD;  Location: [facility];  Service: Gynecology;  Laterality: N/A;   Past Surgical History:  Procedure Laterality Date   ROBOTIC ASSISTED LAPAROSCOPIC HYSTERECTOMY AND SALPINGECTOMY Bilateral 05/17/2023   Procedure: XI ROBOTIC ASSISTED LAPAROSCOPIC HYSTERECTOMY AND SALPINGECTOMY;  Surgeon: Myna Hidalgo, DO;  Location: [facility];  Service: Gynecology;  Laterality: Bilateral;   TOOTH EXTRACTION     TUBAL LIGATION N/A 06/29/2020   Procedure: POST PARTUM TUBAL LIGATION;  Surgeon: Tereso Newcomer, MD;  Location: [facility];  Service: Gynecology;  Laterality: N/A;   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   Hypertension    Pre-diabetes    LMP 09/20/2022 (Approximate)   Opioid Risk Score:   Fall Risk Score:  `1  Depression screen [facility] 2/9     06/07/2023    2:31 PM 03/22/2023    9:40 AM 02/22/2023   12:59 PM 01/18/2023    1:14 PM 12/21/2022   11:23 AM 12/14/2022    1:48 PM 12/14/2022    1:41 PM  Depression screen PHQ 2/9  Decreased Interest 0 1 0 1 0 0 0  Down, Depressed, Hopeless 0 1 0 0 0 0 0  PHQ - 2 Score 0 2 0 1 0 0 0  Altered sleeping    2  2 2   Tired, decreased energy    3  2 2   Change in appetite    0  0 0  Feeling bad or failure about yourself     0  0 0  Trouble concentrating    0  1 1  Moving slowly or fidgety/restless    0  0 0  Suicidal thoughts    0  0 0  PHQ-9 Score    6  5 5   Difficult doing work/chores    Not  difficult at all   Somewhat difficult    Review of Systems  Eyes:  Positive for visual disturbance.  Musculoskeletal:  Positive for back pain.       Right knee pain, pain in both buttocks  All other systems reviewed and are  negative.      Objective:   Physical Exam Vitals and nursing note reviewed.  Constitutional:      Appearance: She is obese.  HENT:     Head: Normocephalic and atraumatic.  Neurological:     Mental Status: She is alert and oriented to person, place, and time.     Motor: No weakness.     Gait: Gait normal.     Comments: 5/5 strength bilateral hip flexor knee extensor ankle dorsiflexor Negative straight leg raising bilaterally  Psychiatric:        Mood and Affect: Mood normal.        Behavior: Behavior normal.           Assessment & Plan:   1.  Axial spondyloarthropathy being followed by rheumatology on monoclonal antibody therapy.  Still has not obtained maximum benefit from this.  She has rheumatology follow-up in December.  We discussed that she will likely be able to come off the Tylenol with codeine.  2.  Trochanteric bursitis bilateral hips.  Physical therapy interrupted by hysterectomy.  Once she gets clearance from gynecology will be able to resume.  May need another PT order she has been instructed to call. 3.  Lower extremity pain reviewed MRI of the lumbar spine no evidence of significant spinal nerve root compression.  The patient may have paresthesias related to her inflammatory arthropathy possibly a concomitant fibromyalgia syndrome

## 2023-07-05 ENCOUNTER — Other Ambulatory Visit: Payer: Self-pay | Admitting: Physical Medicine & Rehabilitation

## 2023-07-14 ENCOUNTER — Ambulatory Visit: Admitting: Obstetrics & Gynecology

## 2023-07-14 ENCOUNTER — Encounter: Payer: Self-pay | Admitting: Obstetrics & Gynecology

## 2023-07-14 VITALS — BP 117/77 | HR 87 | Ht 65.0 in | Wt 184.4 lb

## 2023-07-14 DIAGNOSIS — Z4889 Encounter for other specified surgical aftercare: Secondary | ICD-10-CM

## 2023-07-14 NOTE — Progress Notes (Signed)
    PostOp Visit Note  Gabriella Schmidt is a 42 y.o. G10P0101 female who presents for a postoperative visit. She is 8 weeks postop following a RH/LAVH completed on 8/27   Today she notes that she is doing well.  Denies pain or discomfort Denies fever or chills.  Tolerating gen diet.  +Flatus, Regular BMs.  She noted a change in her discharge around 6 weeks, but that has since resolved.  Denies vaginal spotting, discharge or irritation.  Overall doing well and reports no acute complaints   Review of Systems Pertinent items are noted in HPI.    Objective:  BP 117/77 (BP Location: Right Arm, Patient Position: Sitting, Cuff Size: Normal)   Pulse 87   Ht 5\' 5"  (1.651 m)   Wt 184 lb 6.4 oz (83.6 kg)   LMP 09/20/2022 (Approximate)   BMI 30.69 kg/m    Physical Examination:  GENERAL ASSESSMENT: well developed and well nourished SKIN: normal color, no lesions CHEST: normal air exchange, respiratory effort normal with no retractions HEART: regular rate and rhythm ABDOMEN: soft, non-distended, no rebound no guarding INCISION: Clean dry intact well-healed GU: Normal external genitalia, pink moist vaginal mucosa, vaginal cuff appears well-healed.  On bimanual exam cuff intact no abnormalities, tenderness or masses appreciated EXTREMITY: No edema, no calf tenderness PSYCH: mood appropriate, normal affect       Assessment:   Postop visit  Plan:   -meeting milestones appropriately -May return to regular activity -Discussed that Pap smears are no longer indicated -Follow-up as needed or annuals  Myna Hidalgo, DO Attending Obstetrician & Gynecologist, [facility] [employer], [facility]

## 2023-07-15 ENCOUNTER — Encounter: Payer: Self-pay | Admitting: Internal Medicine

## 2023-07-18 NOTE — Telephone Encounter (Signed)
I am okay with [name] providing placard would do 6 month temporary one at this time.

## 2023-07-19 NOTE — Telephone Encounter (Signed)
Patient advised Dr. Dimple Casey is okay with [name] providing placard would do 6 month temporary one at this time. Patient verbalized understanding. Advised the patient I would put the application at the front desk for her to pick it up.

## 2023-07-23 ENCOUNTER — Encounter: Payer: Self-pay | Admitting: Physical Medicine & Rehabilitation

## 2023-07-23 DIAGNOSIS — M7061 Trochanteric bursitis, right hip: Secondary | ICD-10-CM

## 2023-08-11 ENCOUNTER — Other Ambulatory Visit: Payer: Self-pay | Admitting: Physical Medicine & Rehabilitation
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
## 2023-08-22 ENCOUNTER — Other Ambulatory Visit: Payer: Self-pay | Admitting: Family Medicine

## 2023-08-22 DIAGNOSIS — G4700 Insomnia, unspecified: Secondary | ICD-10-CM

## 2023-08-23 ENCOUNTER — Encounter: Admitting: Physical Medicine & Rehabilitation

## 2023-08-30 ENCOUNTER — Encounter: Admitting: Physical Medicine & Rehabilitation

## 2023-08-30 ENCOUNTER — Ambulatory Visit: Attending: Physical Medicine & Rehabilitation | Admitting: Internal Medicine

## 2023-08-30 ENCOUNTER — Encounter: Payer: Self-pay | Admitting: Internal Medicine

## 2023-08-30 VITALS — BP 123/80 | HR 88 | Resp 14 | Ht 65.0 in | Wt 180.0 lb

## 2023-08-30 DIAGNOSIS — M222X9 Patellofemoral disorders, unspecified knee: Secondary | ICD-10-CM | POA: Insufficient documentation

## 2023-08-30 DIAGNOSIS — M45A7 Non-radiographic axial spondyloarthritis of lumbosacral region: Secondary | ICD-10-CM | POA: Insufficient documentation

## 2023-08-30 DIAGNOSIS — M222X1 Patellofemoral disorders, right knee: Secondary | ICD-10-CM | POA: Diagnosis not present

## 2023-08-30 DIAGNOSIS — Z79899 Other long term (current) drug therapy: Secondary | ICD-10-CM | POA: Insufficient documentation

## 2023-08-31 ENCOUNTER — Telehealth: Payer: Self-pay | Admitting: Pharmacist

## 2023-08-31 DIAGNOSIS — M45A7 Non-radiographic axial spondyloarthritis of lumbosacral region: Secondary | ICD-10-CM

## 2023-08-31 DIAGNOSIS — Z79899 Other long term (current) drug therapy: Secondary | ICD-10-CM

## 2023-08-31 LAB — CBC WITH DIFFERENTIAL/PLATELET
Absolute Lymphocytes: 1891 {cells}/uL (ref 850–3900)
Absolute Monocytes: 504 {cells}/uL (ref 200–950)
Basophils Absolute: 62 {cells}/uL (ref 0–200)
Basophils Relative: 0.9 %
Eosinophils Absolute: 138 {cells}/uL (ref 15–500)
Eosinophils Relative: 2 %
HCT: 40.2 % (ref 35.0–45.0)
Hemoglobin: 12.7 g/dL (ref 11.7–15.5)
MCH: 25.8 pg — ABNORMAL LOW (ref 27.0–33.0)
MCHC: 31.6 g/dL — ABNORMAL LOW (ref 32.0–36.0)
MCV: 81.7 fL (ref 80.0–100.0)
MPV: 10 fL (ref 7.5–12.5)
Monocytes Relative: 7.3 %
Neutro Abs: 4306 {cells}/uL (ref 1500–7800)
Neutrophils Relative %: 62.4 %
Platelets: 322 10*3/uL (ref 140–400)
RBC: 4.92 10*6/uL (ref 3.80–5.10)
RDW: 14.6 % (ref 11.0–15.0)
Total Lymphocyte: 27.4 %
WBC: 6.9 10*3/uL (ref 3.8–10.8)

## 2023-08-31 LAB — COMPLETE METABOLIC PANEL WITH GFR
AG Ratio: 1.3 (calc) (ref 1.0–2.5)
ALT: 30 U/L — ABNORMAL HIGH (ref 6–29)
AST: 23 U/L (ref 10–30)
Albumin: 4.4 g/dL (ref 3.6–5.1)
Alkaline phosphatase (APISO): 86 U/L (ref 31–125)
BUN: 13 mg/dL (ref 7–25)
CO2: 27 mmol/L (ref 20–32)
Calcium: 9.2 mg/dL (ref 8.6–10.2)
Chloride: 104 mmol/L (ref 98–110)
Creat: 0.78 mg/dL (ref 0.50–0.99)
Globulin: 3.4 g/dL (ref 1.9–3.7)
Glucose, Bld: 95 mg/dL (ref 65–99)
Potassium: 4.5 mmol/L (ref 3.5–5.3)
Sodium: 139 mmol/L (ref 135–146)
Total Bilirubin: 0.7 mg/dL (ref 0.2–1.2)
Total Protein: 7.8 g/dL (ref 6.1–8.1)
eGFR: 98 mL/min/{1.73_m2} (ref >=60)

## 2023-08-31 LAB — C-REACTIVE PROTEIN: CRP: 20.6 mg/L — ABNORMAL HIGH (ref <8.0)

## 2023-08-31 LAB — SEDIMENTATION RATE: Sed Rate: 41 mm/h — ABNORMAL HIGH (ref 0–20)

## 2023-08-31 MED ORDER — DICLOFENAC SODIUM 50 MG PO TBEC
DELAYED_RELEASE_TABLET | ORAL | 0 refills | Status: DC
Start: 2023-08-31 — End: 2023-12-05

## 2023-08-31 NOTE — Telephone Encounter (Addendum)
Please run PA for Cosentyx SQ  Dose: 300mg  Sensoready dose every 4 weeks  Please attached page 1 of prescribing information showing that increased dose is FDA-approved  Chesley Mires, PharmD, MPH, BCPS, CPP Clinical Pharmacist (Rheumatology and Pulmonology)  ----- Message from Jamesetta [facility] sent at 08/31/2023  8:17 AM EST ----- Sedimentation rate of 41 is increased and CRP of 20.6 is increased consistent with ongoing inflammation. Blood count and kidney function are normal.  ALT which is a liver enzyme test is 1 point outside of normal which is probably not significant unless we saw a trend going forwards. I will check with the pharmacy about whether increasing the Cosentyx dose would be an option so that symptoms do not get bad every time between treatments.

## 2023-08-31 NOTE — Progress Notes (Signed)
Sedimentation rate of 41 is increased and CRP of 20.6 is increased consistent with ongoing inflammation. Blood count and kidney function are normal.  ALT which is a liver enzyme test is 1 point outside of normal which is probably not significant unless we saw a trend going forwards. I will check with the pharmacy about whether increasing the Cosentyx dose would be an option so that symptoms do not get bad every time between treatments.

## 2023-09-01 MED ORDER — COSENTYX SENSOREADY (300 MG) 150 MG/ML ~~LOC~~ SOAJ
300.0000 mg | SUBCUTANEOUS | 0 refills | Status: DC
Start: 2023-09-01 — End: 2024-01-03

## 2023-09-01 NOTE — Telephone Encounter (Signed)
Authorization is not required as patient has [facility]. Rx sent to [facility]  Patient notified via MyChart  Chesley Mires, PharmD, MPH, BCPS, CPP Clinical Pharmacist (Rheumatology and Pulmonology)

## 2023-09-15 ENCOUNTER — Other Ambulatory Visit: Payer: Self-pay | Admitting: Physical Medicine & Rehabilitation

## 2023-09-27 ENCOUNTER — Encounter: Attending: Physical Medicine & Rehabilitation | Admitting: Physical Medicine & Rehabilitation

## 2023-10-06 ENCOUNTER — Ambulatory Visit: Admitting: Family Medicine

## 2023-10-13 ENCOUNTER — Encounter: Payer: Self-pay | Admitting: Physical Medicine & Rehabilitation

## 2023-10-14 ENCOUNTER — Encounter: Admitting: Physical Medicine & Rehabilitation

## 2023-10-21 ENCOUNTER — Other Ambulatory Visit: Payer: Self-pay | Admitting: Family Medicine

## 2023-10-21 DIAGNOSIS — F418 Other specified anxiety disorders: Secondary | ICD-10-CM

## 2023-10-24 ENCOUNTER — Ambulatory Visit (INDEPENDENT_AMBULATORY_CARE_PROVIDER_SITE_OTHER): Admitting: Family Medicine

## 2023-10-24 ENCOUNTER — Encounter: Payer: Self-pay | Admitting: Family Medicine

## 2023-10-24 VITALS — BP 135/82 | HR 84 | Ht 65.0 in | Wt 176.0 lb

## 2023-10-24 DIAGNOSIS — G4700 Insomnia, unspecified: Secondary | ICD-10-CM | POA: Diagnosis not present

## 2023-10-24 DIAGNOSIS — E7849 Other hyperlipidemia: Secondary | ICD-10-CM

## 2023-10-24 DIAGNOSIS — E559 Vitamin D deficiency, unspecified: Secondary | ICD-10-CM | POA: Diagnosis not present

## 2023-10-24 DIAGNOSIS — I10 Essential (primary) hypertension: Secondary | ICD-10-CM | POA: Diagnosis not present

## 2023-10-24 DIAGNOSIS — R7301 Impaired fasting glucose: Secondary | ICD-10-CM | POA: Diagnosis not present

## 2023-10-24 DIAGNOSIS — E038 Other specified hypothyroidism: Secondary | ICD-10-CM

## 2023-10-24 MED ORDER — TRAZODONE HCL 50 MG PO TABS: 50.0000 mg | ORAL_TABLET | Freq: Every day | ORAL | 0 refills | Status: DC

## 2023-10-24 NOTE — Assessment & Plan Note (Addendum)
The patient has a chronic history of insomnia, reporting difficulty falling asleep and maintaining sleep. She averages 5 to 6 hours of sleep per night and has a three-year-old child who often wakes her up in the morning. She reports compliance with trazodone 150 mg but states that she continues to have difficulty with sleep onset and maintenance despite treatment.  Will Increase trazodone to 200 mg 30 minutes to an hour before bedtime. Encouraged to start taking 1 tablet of 150 mg and 1 tablet of 50 mg, for a total of 200 mg, at bedtime. Reviewed sleep hygiene practices with the patient, who verbalized understanding. Encouraged adherence to the treatment regimen and to follow up if symptoms persist or worsen. The patient is aware of the plan of care.

## 2023-10-24 NOTE — Assessment & Plan Note (Signed)
Controlled without medication A low-sodium diet of less than 2300 mg daily is recommended, along with increased physical activity of moderate intensity, aiming for 150 minutes weekly. The patient is encouraged to continue with these lifestyle modifications to help manage their blood pressure effectively.  BP Readings from Last 3 Encounters:  10/24/23 135/82  08/30/23 123/80  07/14/23 117/77

## 2023-10-24 NOTE — Patient Instructions (Addendum)
I appreciate the opportunity to provide care to you today!    Follow up:  4 months  Labs: please stop by the lab during the week to get your blood drawn (CBC, CMP, TSH, Lipid profile, HgA1c, Vit D)  Start taking 1 tablet of 150 mg and 1 tablet of 50 mg, for a total of 200 mg, at bedtime.   Non-Pharmacological Management for Sleep Hygiene:  Establish a Consistent Bedtime Routine: -Develop and adhere to a regular sleep and wake schedule. -Avoid using electronic devices, including computers and smartphones, at least one hour before bedtime. -If unable to fall asleep within 15 minutes, refrain from staying in bed and engage in a relaxing activity until you feel sleepy. Reduce Daily Stress: -Engage in stress-reducing activities before bedtime to help relax your mind and body. Avoid intense physical exercise and stimulant use, such as caffeine, late in the day. Optimize Sleep Environment: -Use the bed and bedroom exclusively for sleep and intimate activities. -Consider removing electronic devices from the sleeping area and limit screen time prior to bedtime. Incorporate Relaxation Techniques: -Practice abdominal breathing and meditation to promote relaxation. Utilize progressive muscle relaxation and visualization techniques to aid in achieving restful sleep.      Please continue to a heart-healthy diet and increase your physical activities. Try to exercise for at least five days a week.    It was a pleasure to see you and I look forward to continuing to work together on your health and well-being. Please do not hesitate to call the office if you need care or have questions about your care.  In case of emergency, please visit the Emergency Department for urgent care, or contact our clinic at [phone] to schedule an appointment. We're here to help you!   Have a wonderful day and week. With Gratitude, Gilmore Laroche MSN, FNP-BC

## 2023-10-24 NOTE — Progress Notes (Addendum)
Established Patient Office Visit  Subjective:  Patient ID: Gabriella Schmidt, female    DOB: 1981/02/11  Age: 43 y.o. MRN: [id number]  CC:  Chief Complaint  Patient presents with   Medical Management of Chronic Issues    5 month follow up. :  still having trouble sleeping even w/ medication assistance.  Wt. Loss is promising .    HPI Gabriella Schmidt is a 43 y.o. female with past medical history of insomnia presents for f/u of  chronic medical conditions. For the details of today's visit, please refer to the assessment and plan.     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   Hypertension    Pre-diabetes     Past Surgical History:  Procedure Laterality Date   ROBOTIC ASSISTED LAPAROSCOPIC HYSTERECTOMY AND SALPINGECTOMY Bilateral 05/17/2023   Procedure: XI ROBOTIC ASSISTED LAPAROSCOPIC HYSTERECTOMY AND SALPINGECTOMY;  Surgeon: Myna Hidalgo, DO;  Location: [facility];  Service: Gynecology;  Laterality: Bilateral;   TOOTH EXTRACTION     TUBAL LIGATION N/A 06/29/2020   Procedure: POST PARTUM TUBAL LIGATION;  Surgeon: Tereso Newcomer, MD;  Location: [facility];  Service: Gynecology;  Laterality: N/A;    Family History  Problem Relation Age of Onset   Hypertension Mother    Skin cancer Mother    Hypertension Father    Cardiomyopathy Father    Lymphoma Father    Obesity Brother    Obesity Brother    Obesity Brother    Hypertension Maternal Grandmother    Hypertension Maternal Grandfather    Healthy Daughter     Social History   Socioeconomic History   Marital status: Married    Spouse name: Nylah Butkus   Number of children: 1   Years of education: Not on file   Highest education level: Some college, no degree  Occupational History   Not on file  Tobacco  Use   Smoking status: Former    Current packs/day: 0.00    Average packs/day: 0.5 packs/day for 13.0 years (6.5 ttl pk-yrs)    Types: Cigarettes    Start date: 01/2000    Quit date: 01/2013    Years since quitting: 10.7    Passive exposure: Past   Smokeless tobacco: Never  Vaping Use   Vaping status: Former   Quit date: 09/20/2018  Substance and Sexual Activity   Alcohol use: Not Currently   Drug use: Never   Sexual activity: Yes    Birth control/protection: Surgical    Comment: Hysterectomy  Other Topics Concern   Not on file  Social History Narrative   Not on file   Social Drivers of Health   Financial Resource Strain: Low Risk  (10/20/2023)   Overall Financial Resource Strain (CARDIA)    Difficulty of Paying Living Expenses: Not hard at all  Food Insecurity: No Food Insecurity (10/20/2023)   Hunger Vital Sign    Worried About Running Out of Food in the Last Year: Never true    Ran Out of Food in the Last Year: Never true  Transportation Needs: No Transportation Needs (10/20/2023)   PRAPARE - Administrator, Civil Service (Medical): No    Lack of Transportation (  Non-Medical): No  Physical Activity: Insufficiently Active (10/20/2023)   Exercise Vital Sign    Days of Exercise per Week: 3 days    Minutes of Exercise per Session: 20 min  Stress: No Stress Concern Present (10/20/2023)   [employer] of Occupational Health - Occupational Stress Questionnaire    Feeling of Stress : Only a little  Social Connections: Socially Integrated (10/20/2023)   Social Connection and Isolation Panel [NHANES]    Frequency of Communication with Friends and Family: More than three times a week    Frequency of Social Gatherings with Friends and Family: More than three times a week    Attends Religious Services: More than 4 times per year    Active Member of [employer] or Organizations: Yes    Attends Banker Meetings: 1 to 4 times per year    Marital Status: Married   Catering manager Violence: Not At Risk (12/14/2022)   Humiliation, Afraid, Rape, and Kick questionnaire    Fear of Current or Ex-Partner: No    Emotionally Abused: No    Physically Abused: No    Sexually Abused: No    Outpatient Medications Prior to Visit  Medication Sig Dispense Refill   acetaminophen-codeine (TYLENOL #4) 300-60 MG tablet Take 1 tablet by mouth every 4 (four) hours as needed for moderate pain. 30 tablet 2   albuterol (VENTOLIN HFA) 108 (90 Base) MCG/ACT inhaler Inhale 2 puffs into the lungs every 6 (six) hours as needed for wheezing or shortness of breath. 8 g 2   diclofenac (VOLTAREN) 50 MG EC tablet Take 1-2 times daily as needed 180 tablet 0   diphenhydrAMINE (BENADRYL) 25 MG tablet Take 50 mg by mouth at bedtime as needed for allergies or sleep.     gabapentin (NEURONTIN) 300 MG capsule TAKE 1 CAPSULE(300 MG) BY MOUTH TWICE DAILY 90 capsule 1   pyridOXINE (VITAMIN B6) 100 MG tablet Take 100 mg by mouth daily.     Secukinumab, 300 MG Dose, (COSENTYX SENSOREADY, 300 MG,) 150 MG/ML SOAJ Inject 2 mLs (300 mg total) into the skin every 28 (twenty-eight) days. **dose increase** 6 mL 0   sertraline (ZOLOFT) 100 MG tablet TAKE 2 TABLETS(200 MG) BY MOUTH DAILY 180 tablet 1   traZODone (DESYREL) 150 MG tablet TAKE 1 TABLET(150 MG) BY MOUTH AT BEDTIME AS NEEDED FOR SLEEP 90 tablet 0   Vitamin D, Ergocalciferol, (DRISDOL) 1.25 MG (50000 UNIT) CAPS capsule TAKE 1 CAPSULE BY MOUTH EVERY 7 DAYS 10 capsule 2   cyclobenzaprine (FLEXERIL) 10 MG tablet Take 1 tablet (10 mg total) by mouth 3 (three) times daily as needed for muscle spasms. (Patient not taking: Reported on 10/24/2023) 60 tablet 0   ibuprofen (ADVIL) 600 MG tablet Take 1 tablet (600 mg total) by mouth every 6 (six) hours as needed. (Patient not taking: Reported on 10/24/2023) 30 tablet 0   ondansetron (ZOFRAN-ODT) 4 MG disintegrating tablet Take 1 tablet (4 mg total) by mouth every 8 (eight) hours as needed. (Patient not taking:  Reported on 10/24/2023) 20 tablet 0   No facility-administered medications prior to visit.    Allergies  Allergen Reactions   Latex Rash    ROS Review of Systems  Constitutional:  Negative for chills and fever.  Eyes:  Negative for visual disturbance.  Respiratory:  Negative for chest tightness and shortness of breath.   Neurological:  Negative for dizziness and headaches.  Psychiatric/Behavioral:  Positive for sleep disturbance.       Objective:  Physical Exam HENT:     Head: Normocephalic.     Mouth/Throat:     Mouth: Mucous membranes are moist.  Cardiovascular:     Rate and Rhythm: Normal rate.     Heart sounds: Normal heart sounds.  Pulmonary:     Effort: Pulmonary effort is normal.     Breath sounds: Normal breath sounds.  Neurological:     Mental Status: She is alert.     BP 135/82   Pulse 84   Ht 5\' 5"  (1.651 m)   Wt 176 lb (79.8 kg)   LMP 09/20/2022 (Approximate)   SpO2 97%   BMI 29.29 kg/m  Wt Readings from Last 3 Encounters:  10/24/23 176 lb (79.8 kg)  08/30/23 180 lb (81.6 kg)  07/14/23 184 lb 6.4 oz (83.6 kg)    Lab Results  Component Value Date   TSH 1.150 06/03/2023   Lab Results  Component Value Date   WBC 6.9 08/30/2023   HGB 12.7 08/30/2023   HCT 40.2 08/30/2023   MCV 81.7 08/30/2023   PLT 322 08/30/2023   Lab Results  Component Value Date   NA 139 08/30/2023   K 4.5 08/30/2023   CO2 27 08/30/2023   GLUCOSE 95 08/30/2023   BUN 13 08/30/2023   CREATININE 0.78 08/30/2023   BILITOT 0.7 08/30/2023   ALKPHOS 92 06/03/2023   AST 23 08/30/2023   ALT 30 (H) 08/30/2023   PROT 7.8 08/30/2023   ALBUMIN 4.1 06/03/2023   CALCIUM 9.2 08/30/2023   ANIONGAP 10 05/13/2023   EGFR 98 08/30/2023   Lab Results  Component Value Date   CHOL 188 06/03/2023   Lab Results  Component Value Date   HDL 51 06/03/2023   Lab Results  Component Value Date   LDLCALC 116 (H) 06/03/2023   Lab Results  Component Value Date   TRIG 116  06/03/2023   Lab Results  Component Value Date   CHOLHDL 3.7 06/03/2023   Lab Results  Component Value Date   HGBA1C 5.7 (H) 06/03/2023      Assessment & Plan:  Chronic hypertension Assessment & Plan: Controlled without medication A low-sodium diet of less than 2300 mg daily is recommended, along with increased physical activity of moderate intensity, aiming for 150 minutes weekly. The patient is encouraged to continue with these lifestyle modifications to help manage their blood pressure effectively.  BP Readings from Last 3 Encounters:  10/24/23 135/82  08/30/23 123/80  07/14/23 117/77      Insomnia, unspecified type Assessment & Plan: The patient has a chronic history of insomnia, reporting difficulty falling asleep and maintaining sleep. She averages 5 to 6 hours of sleep per night and has a three-year-old child who often wakes her up in the morning. She reports compliance with trazodone 150 mg but states that she continues to have difficulty with sleep onset and maintenance despite treatment.  Will Increase trazodone to 200 mg 30 minutes to an hour before bedtime. Encouraged to start taking 1 tablet of 150 mg and 1 tablet of 50 mg, for a total of 200 mg, at bedtime. Reviewed sleep hygiene practices with the patient, who verbalized understanding. Encouraged adherence to the treatment regimen and to follow up if symptoms persist or worsen. The patient is aware of the plan of care.   Orders: -     traZODone HCl; Take 1 tablet (50 mg total) by mouth at bedtime.  Dispense: 90 tablet; Refill: 0  IFG (impaired fasting glucose) -  Hemoglobin A1c  Vitamin D deficiency -     VITAMIN D 25 Hydroxy (Vit-D Deficiency, Fractures)  TSH (thyroid-stimulating hormone deficiency) -     TSH + free T4  Other hyperlipidemia -     Lipid panel -     CMP14+EGFR -     CBC with Differential/Platelet  Note: This chart has been completed using Engineer, civil (consulting) software, and  while attempts have been made to ensure accuracy, certain words and phrases may not be transcribed as intended.    Follow-up: Return in about 4 months (around 02/21/2024).   Gilmore Laroche, FNP

## 2023-10-25 ENCOUNTER — Encounter: Attending: Physical Medicine & Rehabilitation | Admitting: Physical Medicine & Rehabilitation

## 2023-10-25 ENCOUNTER — Encounter: Payer: Self-pay | Admitting: Physical Medicine & Rehabilitation

## 2023-10-25 ENCOUNTER — Ambulatory Visit
Admission: RE | Admit: 2023-10-25 | Discharge: 2023-10-25 | Disposition: A | Discharge status: Home / Self Care | Source: Ambulatory Visit | Attending: Physical Medicine & Rehabilitation | Admitting: Physical Medicine & Rehabilitation

## 2023-10-25 VITALS — BP 118/76 | HR 81 | Ht 65.0 in | Wt 176.0 lb

## 2023-10-25 DIAGNOSIS — G8929 Other chronic pain: Secondary | ICD-10-CM | POA: Insufficient documentation

## 2023-10-25 DIAGNOSIS — M7062 Trochanteric bursitis, left hip: Secondary | ICD-10-CM | POA: Insufficient documentation

## 2023-10-25 DIAGNOSIS — M25562 Pain in left knee: Secondary | ICD-10-CM | POA: Diagnosis not present

## 2023-10-25 DIAGNOSIS — M7061 Trochanteric bursitis, right hip: Secondary | ICD-10-CM | POA: Diagnosis present

## 2023-10-25 NOTE — Progress Notes (Signed)
 Subjective:    Patient ID: Gabriella Schmidt, female    DOB: 09-Nov-1980, 43 y.o.   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  Recently increased the dose to 300 mg subcu weekly, has only 1 dose Seen by rheumatology 08/30/2023 diagnosed with left patellar tendinitis, at that time her knee pain was mainly anterior on the left side. The patient states now that her pain is also posterior and lateral and medial.  She has noticed no new joint swelling or deformity.  She has had no new falls or trauma to the area.  She has not had any imaging studies to the left knee.  She has no numbness or tingling in the knee no foot or ankle weakness.  We discussed her previous treatments which have focused on low back/upper buttock pain.  These have included sacroiliac injection under ultrasound as well as fluoroscopic guidance, left piriformis injection under ultrasound guidance, she has had bilateral L5-S1 facet medial branch blocks under fluoroscopic guidance performed by Dr. Eldonna.  None of these procedures were particularly helpful in alleviating her usual pain.  She has had some lateral hip pain as well, she was sent to physical therapy but had a hysterectomy as well as some family illness and she was not able to start this yet.  She would like to start this at this time.  The plan was to do potential greater trochanteric injections if physical therapy was not helpful Pain Inventory Average Pain 8 Pain Right Now 8 My pain is constant, dull, and throbbing  In the last 24 hours, has pain interfered with the following? General activity 2 Relation with others 0 Enjoyment of life 0 What TIME of day is your pain at its worst? morning , daytime, evening, and night Sleep (in general) Fair  Pain is worse with: walking, bending, sitting, inactivity, standing, and some activites Pain improves with:  medication Relief from Meds: 6  Family History  Problem Relation Age of Onset   Hypertension Mother    Skin cancer Mother    Hypertension Father    Cardiomyopathy Father    Lymphoma Father    Obesity Brother    Obesity Brother    Obesity Brother    Hypertension Maternal Grandmother    Hypertension Maternal Grandfather    Healthy Daughter    Social History   Socioeconomic History   Marital status: Married    Spouse name: Bambie Pizzolato   Number of children: 1   Years of education: Not on file   Highest education level: Some college, no degree  Occupational History   Not on file  Tobacco Use   Smoking status: Former    Current packs/day: 0.00    Average packs/day: 0.5 packs/day for 13.0 years (6.5 ttl pk-yrs)    Types: Cigarettes    Start date: 01/2000    Quit date: 01/2013    Years since quitting: 10.7    Passive exposure: Past   Smokeless tobacco: Never  Vaping Use   Vaping status: Former   Quit date: 09/20/2018  Substance and Sexual Activity   Alcohol use: Not Currently   Drug use: Never   Sexual activity: Yes    Birth control/protection: Surgical    Comment: Hysterectomy  Other Topics Concern   Not on file  Social History Narrative   Not on file   Social Drivers of Health   Financial Resource Strain: Low Risk  (  10/20/2023)   Overall Financial Resource Strain (CARDIA)    Difficulty of Paying Living Expenses: Not hard at all  Food Insecurity: No Food Insecurity (10/20/2023)   Hunger Vital Sign    Worried About Running Out of Food in the Last Year: Never true    Ran Out of Food in the Last Year: Never true  Transportation Needs: No Transportation Needs (10/20/2023)   PRAPARE - Administrator, Civil Service (Medical): No    Lack of Transportation (Non-Medical): No  Physical Activity: Insufficiently Active (10/20/2023)   Exercise Vital Sign    Days of Exercise per Week: 3 days    Minutes of Exercise per Session: 20 min  Stress: No Stress Concern  Present (10/20/2023)   [employer] of Occupational Health - Occupational Stress Questionnaire    Feeling of Stress : Only a little  Social Connections: Socially Integrated (10/20/2023)   Social Connection and Isolation Panel [NHANES]    Frequency of Communication with Friends and Family: More than three times a week    Frequency of Social Gatherings with Friends and Family: More than three times a week    Attends Religious Services: More than 4 times per year    Active Member of Clubs or Organizations: Yes    Attends Banker Meetings: 1 to 4 times per year    Marital Status: Married   Past Surgical History:  Procedure Laterality Date   ROBOTIC ASSISTED LAPAROSCOPIC HYSTERECTOMY AND SALPINGECTOMY Bilateral 05/17/2023   Procedure: XI ROBOTIC ASSISTED LAPAROSCOPIC HYSTERECTOMY AND SALPINGECTOMY;  Surgeon: Marilynn Nest, DO;  Location: [facility];  Service: Gynecology;  Laterality: Bilateral;   TOOTH EXTRACTION     TUBAL LIGATION N/A 06/29/2020   Procedure: POST PARTUM TUBAL LIGATION;  Surgeon: Herchel Gloris LABOR, MD;  Location: [facility];  Service: Gynecology;  Laterality: N/A;   Past Surgical History:  Procedure Laterality Date   ROBOTIC ASSISTED LAPAROSCOPIC HYSTERECTOMY AND SALPINGECTOMY Bilateral 05/17/2023   Procedure: XI ROBOTIC ASSISTED LAPAROSCOPIC HYSTERECTOMY AND SALPINGECTOMY;  Surgeon: Marilynn Nest, DO;  Location: [facility];  Service: Gynecology;  Laterality: Bilateral;   TOOTH EXTRACTION     TUBAL LIGATION N/A 06/29/2020   Procedure: POST PARTUM TUBAL LIGATION;  Surgeon: Herchel Gloris LABOR, MD;  Location: [facility];  Service: Gynecology;  Laterality: N/A;   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   Hypertension    Pre-diabetes    BP 118/76   Pulse  81   Ht 5' 5 (1.651 m)   Wt 176 lb (79.8 kg)   LMP 09/20/2022 (Approximate)   SpO2 96%   BMI 29.29 kg/m   Opioid Risk Score:   Fall Risk Score:  `1  Depression screen PHQ 2/9     10/25/2023    2:50 PM 06/21/2023   12:20 PM 06/07/2023    2:31 PM 03/22/2023    9:40 AM 02/22/2023   12:59 PM 01/18/2023    1:14 PM 12/21/2022   11:23 AM  Depression screen PHQ 2/9  Decreased Interest 0 0 0 1 0 1 0  Down, Depressed, Hopeless 0 0 0 1 0 0 0  PHQ - 2 Score 0 0 0 2 0 1 0  Altered sleeping      2   Tired, decreased energy      3   Change in appetite      0   Feeling bad or failure about yourself       0   Trouble concentrating      0   Moving slowly or fidgety/restless      0   Suicidal thoughts      0   PHQ-9 Score      6   Difficult doing work/chores      Not difficult at all      Review of Systems  Musculoskeletal:  Positive for gait problem.       Left knee pain  All other systems reviewed and are negative.      Objective:   Physical Exam Overweight female no acute distress Mood and affect are appropriate Extremities without edema Left knee no evidence of effusion no evidence of deformity. There is no tenderness palpation in the right knee On the left side there is tenderness along the quad tendon, patellar tendon, medial joint line, lateral joint line, hamstring insertion as well as popliteal There is full range of motion at the left knee She ambulates without assistive device no evidence of antalgia. Hips have no restrictions or pain with range of motion.  Faber's negative bilaterally Thigh thrust negative bilaterally No tenderness over the PSIS There is tenderness over the greater trochanters mild to moderate       Assessment & Plan:   1.  Left knee pain this is a new or complaints.  We advised Voltaren  gel 4 times daily to the left knee X-rays of the left knee given that this has been going on for a couple months. 2.  Nonradiographic axial spondylarthritis,  follow-up with rheumatology.  Would not recommend repeat sacroiliac injections given the lack of efficacy thus far. #3.  Trochanteric bursitis bilateral send therapy.  Consider corticosteroid versus PRP injections if PT not effective

## 2023-10-26 ENCOUNTER — Encounter: Payer: Self-pay | Admitting: Physical Medicine & Rehabilitation

## 2023-10-27 ENCOUNTER — Other Ambulatory Visit: Payer: Self-pay | Admitting: Physical Medicine & Rehabilitation

## 2023-10-31 MED ORDER — ACETAMINOPHEN-CODEINE 300-60 MG PO TABS: 1.0000 | ORAL_TABLET | ORAL | 2 refills | Status: DC | PRN

## 2023-10-31 NOTE — Addendum Note (Signed)
 Addended by: Genetta Kenning on: 10/31/2023 02:04 PM   Modules accepted: Orders

## 2023-10-31 NOTE — Telephone Encounter (Signed)
 Patient stated she asked the pharmacy of a refill.  She currently out of the Rx.

## 2023-11-08 ENCOUNTER — Ambulatory Visit (HOSPITAL_COMMUNITY): Attending: Physical Medicine & Rehabilitation | Admitting: Physical Therapy

## 2023-11-08 ENCOUNTER — Other Ambulatory Visit: Payer: Self-pay

## 2023-11-08 DIAGNOSIS — M7061 Trochanteric bursitis, right hip: Secondary | ICD-10-CM | POA: Diagnosis not present

## 2023-11-08 DIAGNOSIS — M25551 Pain in right hip: Secondary | ICD-10-CM | POA: Diagnosis present

## 2023-11-08 DIAGNOSIS — M6281 Muscle weakness (generalized): Secondary | ICD-10-CM | POA: Insufficient documentation

## 2023-11-08 DIAGNOSIS — M25562 Pain in left knee: Secondary | ICD-10-CM | POA: Diagnosis not present

## 2023-11-08 DIAGNOSIS — M25561 Pain in right knee: Secondary | ICD-10-CM | POA: Insufficient documentation

## 2023-11-08 DIAGNOSIS — G8929 Other chronic pain: Secondary | ICD-10-CM | POA: Diagnosis present

## 2023-11-08 DIAGNOSIS — M7062 Trochanteric bursitis, left hip: Secondary | ICD-10-CM | POA: Diagnosis not present

## 2023-11-08 NOTE — Therapy (Signed)
 OUTPATIENT PHYSICAL THERAPY LOWER EXTREMITY EVALUATION   Patient Name: Gabriella Schmidt MRN: [id number] DOB:1980/10/25, 43 y.o., female Today's Date: 11/08/2023  END OF SESSION:  PT End of Session - 11/08/23 1055     Visit Number 1    Number of Visits 12    Date for PT Re-Evaluation 12/20/23    Authorization Type [facility]    Authorization Time Period No auth; no visit limit    PT Start Time 1020    PT Stop Time 1056    PT Time Calculation (min) 36 min    Activity Tolerance Patient tolerated treatment well    Behavior During Therapy [facility] for tasks assessed/performed             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   Hypertension    Pre-diabetes    Past Surgical History:  Procedure Laterality Date   ROBOTIC ASSISTED LAPAROSCOPIC HYSTERECTOMY AND SALPINGECTOMY Bilateral 05/17/2023   Procedure: XI ROBOTIC ASSISTED LAPAROSCOPIC HYSTERECTOMY AND SALPINGECTOMY;  Surgeon: Myna Hidalgo, DO;  Location: [facility];  Service: Gynecology;  Laterality: Bilateral;   TOOTH EXTRACTION     TUBAL LIGATION N/A 06/29/2020   Procedure: POST PARTUM TUBAL LIGATION;  Surgeon: Tereso Newcomer, MD;  Location: [facility];  Service: Gynecology;  Laterality: N/A;   Patient Active Problem List   Diagnosis Date Noted   Patellofemoral pain syndrome 08/30/2023   Right foot pain 06/10/2023   Dysmenorrhea 05/17/2023   Trochanteric bursitis of both hips 03/22/2023   Encounter for gynecological examination with Papanicolaou smear of cervix 12/14/2022   Hyperglycemia 10/19/2022   High risk medication use 09/28/2022   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/2022   Bilateral sacroiliitis (HCC) 07/27/2022   CRP elevated 07/27/2022   Positive ANA  (antinuclear antibody) 07/27/2022   Pain of both breasts 07/07/2022   Pregnancy examination or test, negative result 07/07/2022   Prolonged periods 07/07/2022   Menorrhagia with regular cycle 03/31/2022   Uterine cramping 03/31/2022   Dyspareunia, female 03/17/2022   Urinary incontinence 03/17/2022   Hematuria 03/17/2022   Overweight (BMI 25.0-29.9) 01/05/2022   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   School health examination 12/10/2021   Weight gain 11/23/2021   Fatigue 11/23/2021   Nausea 11/23/2021   Breast tenderness 11/23/2021   Mass of upper outer quadrant of left breast 11/23/2021   Mass of lower inner quadrant of right breast 11/23/2021   Proteinuria 11/23/2021   Urinary frequency 11/23/2021   Pregnancy test negative 11/23/2021   Anemia 09/02/2021   COVID-19 04/17/2021   Insomnia 01/14/2021   Back pain 01/14/2021   Liver function test abnormality 10/06/2020   Hyperlipidemia 10/06/2020   Dizziness 10/06/2020   Degenerative disc disease at L5-S1 level 10/06/2020   Asthma 09/01/2020   Chronic hypertension 01/31/2020   Depression with anxiety 01/31/2020    PCP: Gilmore Laroche  REFERRING PROVIDER: Claudette Laws  REFERRING DIAG:  Diagnosis  M25.562,G89.29 (ICD-10-CM) - Chronic pain of left knee  M70.61,M70.62 (ICD-10-CM) - Trochanteric bursitis of both hips    THERAPY DIAG:  Diagnosis  M25.562,G89.29 (ICD-10-CM) - Chronic pain of left knee  M70.61,M70.62 (ICD-10-CM) - Trochanteric bursitis of both hips    Rationale  for Evaluation and Treatment: Rehabilitation  ONSET DATE: 09/22/23  SUBJECTIVE:   SUBJECTIVE STATEMENT: Pt states that she started therapy and then she had to have a hysterectomy so she did not come back.  She is having a lot of pain in her left knee for two months, it is insidious in nature.  She can not have is straight for to long, bent for to long and going up and down steps is a nightmare.    PERTINENT HISTORY: See above  PAIN:  Are  you having pain? Yes: NPRS scale: 8/10 knee; 6/10 L hip  Pain location: mainly Lt hip and knee.  Pain description: knee pain is sharp; hip pain is more dull throb  Aggravating factors: Weight bearing  Relieving factors: Meds and elevate her leg   PRECAUTIONS: None  RED FLAGS: None   WEIGHT BEARING RESTRICTIONS: No  FALLS:  Has patient fallen in last 6 months? No  LIVING ENVIRONMENT: Lives with: lives with their family Lives in: House/apartment Stairs: Yes: Internal: 0 steps; and External: 9 steps; on right going up Has following equipment at home: None  OCCUPATION: goes to [employer]   PLOF: Independent  PATIENT GOALS: less pain, be able to walk more and go up and down steps easier.   NEXT MD VISIT: not schedue  OBJECTIVE:  Note: Objective measures were completed at Evaluation unless otherwise noted.  DIAGNOSTIC FINDINGS: IMPRESSION: Minimal medial and patellofemoral compartment osteoarthritis.     Electronically Signed   By: Neita Garnet M.D.   MUSCLE LENGTH: Hamstrings: Right 160 deg; Left 145 deg   POSTURE: No Significant postural limitations  PALPATION: Tender to palpation of both the knee and hip   LOWER EXTREMITY ROM: WFL   LOWER EXTREMITY MMT:  MMT Right eval Left eval  Hip flexion 5 3+  Hip extension 5 3  Hip abduction 4- 3+  Hip adduction    Hip internal rotation    Hip external rotation    Knee flexion 4+ 3+  Knee extension 5 3+ give way due to pain   Ankle dorsiflexion 5 3  Ankle plantarflexion    Ankle inversion    Ankle eversion     (Blank rows = not tested)  FUNCTIONAL TESTS:  30 seconds chair stand test: 7 ;  16 is poor for age and sex;  84 is average  2 minute walk test: 446 ft  SLS:  RT: 15"  ,  LT:   12"                                                                                                                                 TREATMENT DATE: 11/08/23 Evaluation     PATIENT EDUCATION:  Education details: HEP Person  educated: Patient Education method: Chief Technology Officer Education comprehension: verbalized understanding and returned demonstration  HOME EXERCISE PROGRAM: Access Code: [email or web address] URL: [email or web address] Date: 11/08/2023 Prepared by: Virgina Organ  Exercises - Sit  to Stand  - 3 x daily - 7 x weekly - 1 sets - 10 reps - Standing Single Leg Heel Raise  - 2 x daily - 7 x weekly - 1 sets - 5 reps - Seated Long Arc Quad  - 3 x daily - 7 x weekly - 1 sets - 10 reps - 5" hold  ASSESSMENT:  CLINICAL IMPRESSION: Patient is a 43 y.o. female who was seen today for physical therapy evaluation and treatment for knee and hip pain.  Evaluation demonstrates decreased strength, decreased balance, decreased activity tolerance and increased pain.  Ms Travieso will benefit from skilled PT to address these issues and maximize her functional ability.   OBJECTIVE IMPAIRMENTS: Abnormal gait, decreased activity tolerance, decreased balance, difficulty walking, decreased strength, and pain.   ACTIVITY LIMITATIONS: carrying, lifting, bending, standing, squatting, stairs, and locomotion level  PARTICIPATION LIMITATIONS: meal prep, cleaning, and shopping   REHAB POTENTIAL: Good  CLINICAL DECISION MAKING: Evolving/moderate complexity  EVALUATION COMPLEXITY: Moderate   GOALS: Goals reviewed with patient? Yes  SHORT TERM GOALS: Target date: 11/29/2023 PT to be I in HEP in order to reduce her pain to no greater than a 5/10 in her RT LE  Baseline: Goal status: INITIAL  2.  Pt strength to be increased by 1/2 grade to allow pt to go up and down a flight of steps reciprocally Baseline:  Goal status: INITIAL  3.  PT to be able to single leg stance for at least  20   seconds to decrease risk of falls.  Baseline:  Goal status: INITIAL   LONG TERM GOALS: Target date: 12/20/2023  PT to be I in an advanced HEP in order to reduce her pain to no greater than a 3/10  Baseline:  Goal status:  INITIAL  2.  Pt strength to be increased by 1 grade to allow pt to squat to the floor and return without UE assist  Baseline:  Goal status: INITIAL  3.  PT to be able to single leg stance for at least  30  seconds to decrease risk of falls.  Baseline:  Goal status: INITIAL   PLAN:  PT FREQUENCY: 2x/week  PT DURATION: 6 weeks  PLANNED INTERVENTIONS: 97110-Therapeutic exercises, 97530- Therapeutic activity, [id number]- Neuromuscular re-education, 97535- Self Care, and 40981- Manual therapy  PLAN FOR NEXT SESSION: Continue with skilled PT to progress pt functional ability  Virgina Organ, PT CLT [phone]  11/08/2023, 10:56 AM

## 2023-11-18 ENCOUNTER — Encounter: Payer: Self-pay | Admitting: Family Medicine

## 2023-11-21 ENCOUNTER — Other Ambulatory Visit: Payer: Self-pay | Admitting: Family Medicine

## 2023-11-21 ENCOUNTER — Encounter (HOSPITAL_COMMUNITY): Payer: Self-pay

## 2023-11-21 DIAGNOSIS — G4700 Insomnia, unspecified: Secondary | ICD-10-CM

## 2023-11-21 NOTE — Telephone Encounter (Signed)
 Copied from CRM [id number]. Topic: Clinical - Medication Refill >> Nov 21, 2023 12:54 PM Kristie Cowman wrote: Most Recent Primary Care Visit:  Provider: Gilmore Laroche  Department: [facility]  Visit Type: OFFICE VISIT  Date: 10/24/2023  Medication: Trazodone 50 mg   Has the patient contacted their pharmacy? Yes (Agent: If no, request that the patient contact the pharmacy for the refill. If patient does not wish to contact the pharmacy document the reason why and proceed with request.) (Agent: If yes, when and what did the pharmacy advise?)  Is this the correct pharmacy for this prescription? Yes If no, delete pharmacy and type the correct one.  This is the patient's preferred pharmacy:   [facility] [phone] - [place], [place] - [address] AT [facility] [address] [place] [address] Phone: [phone] Fax: [phone] Has the prescription been filled recently? No  Is the patient out of the medication? No  Has the patient been seen for an appointment in the last year OR does the patient have an upcoming appointment? Yes  Can we respond through MyChart? No  Agent: Please be advised that Rx refills may take up to 3 business days. We ask that you follow-up with your pharmacy.

## 2023-11-21 NOTE — Telephone Encounter (Signed)
 Last Fill: 10/24/23 90 tabs/0 RF  Last OV: 10/24/23 Next OV: 02/23/24  Routing to provider for review/authorization.

## 2023-11-22 ENCOUNTER — Other Ambulatory Visit: Payer: Self-pay | Admitting: Family Medicine

## 2023-11-22 DIAGNOSIS — G4700 Insomnia, unspecified: Secondary | ICD-10-CM

## 2023-11-23 ENCOUNTER — Ambulatory Visit (HOSPITAL_COMMUNITY): Attending: Physical Medicine & Rehabilitation

## 2023-11-23 ENCOUNTER — Encounter (HOSPITAL_COMMUNITY): Payer: Self-pay

## 2023-11-23 DIAGNOSIS — G8929 Other chronic pain: Secondary | ICD-10-CM | POA: Insufficient documentation

## 2023-11-23 DIAGNOSIS — M6281 Muscle weakness (generalized): Secondary | ICD-10-CM | POA: Insufficient documentation

## 2023-11-23 DIAGNOSIS — M7071 Other bursitis of hip, right hip: Secondary | ICD-10-CM | POA: Insufficient documentation

## 2023-11-23 DIAGNOSIS — M25551 Pain in right hip: Secondary | ICD-10-CM | POA: Diagnosis present

## 2023-11-23 DIAGNOSIS — M25561 Pain in right knee: Secondary | ICD-10-CM | POA: Insufficient documentation

## 2023-11-23 DIAGNOSIS — M7072 Other bursitis of hip, left hip: Secondary | ICD-10-CM | POA: Insufficient documentation

## 2023-11-23 NOTE — Therapy (Signed)
 OUTPATIENT PHYSICAL THERAPY LOWER EXTREMITY TREATMENT   Patient Name: Gabriella Schmidt MRN: [id number] DOB:04/04/1981, 43 y.o., female Today's Date: 11/23/2023  END OF SESSION:  PT End of Session - 11/23/23 0928     Visit Number 2    Number of Visits 12    Date for PT Re-Evaluation 12/20/23    Authorization Type [place] [facility]    Authorization Time Period No auth; no visit limit    PT Start Time [phone]    PT Stop Time 1012    PT Time Calculation (min) 41 min    Activity Tolerance Patient tolerated treatment well    Behavior During Therapy [facility] for tasks assessed/performed             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   Hypertension    Pre-diabetes    Past Surgical History:  Procedure Laterality Date   ROBOTIC ASSISTED LAPAROSCOPIC HYSTERECTOMY AND SALPINGECTOMY Bilateral 05/17/2023   Procedure: XI ROBOTIC ASSISTED LAPAROSCOPIC HYSTERECTOMY AND SALPINGECTOMY;  Surgeon: Myna Hidalgo, DO;  Location: [facility];  Service: Gynecology;  Laterality: Bilateral;   TOOTH EXTRACTION     TUBAL LIGATION N/A 06/29/2020   Procedure: POST PARTUM TUBAL LIGATION;  Surgeon: Tereso Newcomer, MD;  Location: [facility];  Service: Gynecology;  Laterality: N/A;   Patient Active Problem List   Diagnosis Date Noted   Patellofemoral pain syndrome 08/30/2023   Right foot pain 06/10/2023   Dysmenorrhea 05/17/2023   Trochanteric bursitis of both hips 03/22/2023   Encounter for gynecological examination with Papanicolaou smear of cervix 12/14/2022   Hyperglycemia 10/19/2022   High risk medication use 09/28/2022   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/2022   Bilateral sacroiliitis (HCC) 07/27/2022   CRP elevated 07/27/2022   Positive ANA (antinuclear  antibody) 07/27/2022   Pain of both breasts 07/07/2022   Pregnancy examination or test, negative result 07/07/2022   Prolonged periods 07/07/2022   Menorrhagia with regular cycle 03/31/2022   Uterine cramping 03/31/2022   Dyspareunia, female 03/17/2022   Urinary incontinence 03/17/2022   Hematuria 03/17/2022   Overweight (BMI 25.0-29.9) 01/05/2022   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   School health examination 12/10/2021   Weight gain 11/23/2021   Fatigue 11/23/2021   Nausea 11/23/2021   Breast tenderness 11/23/2021   Mass of upper outer quadrant of left breast 11/23/2021   Mass of lower inner quadrant of right breast 11/23/2021   Proteinuria 11/23/2021   Urinary frequency 11/23/2021   Pregnancy test negative 11/23/2021   Anemia 09/02/2021   COVID-19 04/17/2021   Insomnia 01/14/2021   Back pain 01/14/2021   Liver function test abnormality 10/06/2020   Hyperlipidemia 10/06/2020   Dizziness 10/06/2020   Degenerative disc disease at L5-S1 level 10/06/2020   Asthma 09/01/2020   Chronic hypertension 01/31/2020   Depression with anxiety 01/31/2020    PCP: Gilmore Laroche  REFERRING PROVIDER: Claudette Laws  REFERRING DIAG:  Diagnosis  M25.562,G89.29 (ICD-10-CM) - Chronic pain of left knee  M70.61,M70.62 (ICD-10-CM) - Trochanteric bursitis of both hips    THERAPY DIAG:  Diagnosis  M25.562,G89.29 (ICD-10-CM) - Chronic pain of left knee  M70.61,M70.62 (ICD-10-CM) - Trochanteric bursitis of both hips    Rationale  for Evaluation and Treatment: Rehabilitation  ONSET DATE: 09/22/23  SUBJECTIVE:   SUBJECTIVE STATEMENT: 11/23/23:  Lt knee pain scale 4/10 dull achy pain.  Has began HEP without questions.    Eval:  Pt states that she started therapy and then she had to have a hysterectomy so she did not come back.  She is having a lot of pain in her left knee for two months, it is insidious in nature.  She can not have is straight for to long, bent for to long and going up  and down steps is a nightmare.    PERTINENT HISTORY: See above  PAIN:  Are you having pain? Yes: NPRS scale: 8/10 knee; 6/10 L hip  Pain location: mainly Lt hip and knee.  Pain description: knee pain is sharp; hip pain is more dull throb  Aggravating factors: Weight bearing  Relieving factors: Meds and elevate her leg   PRECAUTIONS: None  RED FLAGS: None   WEIGHT BEARING RESTRICTIONS: No  FALLS:  Has patient fallen in last 6 months? No  LIVING ENVIRONMENT: Lives with: lives with their family Lives in: House/apartment Stairs: Yes: Internal: 0 steps; and External: 9 steps; on right going up Has following equipment at home: None  OCCUPATION: goes to [employer]   PLOF: Independent  PATIENT GOALS: less pain, be able to walk more and go up and down steps easier.   NEXT MD VISIT: not schedue  OBJECTIVE:  Note: Objective measures were completed at Evaluation unless otherwise noted.  DIAGNOSTIC FINDINGS: IMPRESSION: Minimal medial and patellofemoral compartment osteoarthritis.     Electronically Signed   By: Neita Garnet M.D.   MUSCLE LENGTH: Hamstrings: Right 160 deg; Left 145 deg   POSTURE: No Significant postural limitations  PALPATION: Tender to palpation of both the knee and hip   LOWER EXTREMITY ROM: WFL   LOWER EXTREMITY MMT:  MMT Right eval Left eval  Hip flexion 5 3+  Hip extension 5 3  Hip abduction 4- 3+  Hip adduction    Hip internal rotation    Hip external rotation    Knee flexion 4+ 3+  Knee extension 5 3+ give way due to pain   Ankle dorsiflexion 5 3  Ankle plantarflexion    Ankle inversion    Ankle eversion     (Blank rows = not tested)  FUNCTIONAL TESTS:  30 seconds chair stand test: 7 ;  16 is poor for age and sex;  25 is average  2 minute walk test: 446 ft  SLS:  RT: 15"  ,  LT:   12"                                                                                                                                 TREATMENT DATE:   11/23/23:  Reviewed goals Educated importance of HEP compliance for maximal benefits  STS eccentric control with no HHA 10x LAQ 10x  Supine: Bridge with GTB around thighs  Sidelying: Clam with GTB 10x 5" (fatigue at rep 7)  Standing: Heel raises 15x Toe raises 15x Abduction 10x Extension 10x Slant board 3x 30" SLS Rt 21"Lt 18" Squat 2 set 5 reps front of chair (cueing for mechanics)   11/08/23 Evaluation     PATIENT EDUCATION:  Education details: HEP Person educated: Patient Education method: Chief Technology Officer Education comprehension: verbalized understanding and returned demonstration  HOME EXERCISE PROGRAM: Access Code: [email or web address] URL: [email or web address] Date: 11/08/2023 Prepared by: Virgina Organ  Exercises - Sit to Stand  - 3 x daily - 7 x weekly - 1 sets - 10 reps - Standing Single Leg Heel Raise  - 2 x daily - 7 x weekly - 1 sets - 5 reps - Seated Long Arc Quad  - 3 x daily - 7 x weekly - 1 sets - 10 reps - 5" hold  11/23/23: - Supine Bridge with Resistance Band  - 1 x daily - 7 x weekly - 3 sets - 10 reps - Clam with Resistance  - 1 x daily - 7 x weekly - 3 sets - 10 reps ASSESSMENT:  CLINICAL IMPRESSION: 11/23/23:  Reviewed goals and educated importance of HEP compliance for maximal benefits.  Pt able to recall 2 out of 3 of current exercise program.  Added hip stability exercises with positive feedback and added to HEP with printout given and verbalized understanding.    Eval:  Patient is a 43 y.o. female who was seen today for physical therapy evaluation and treatment for knee and hip pain.  Evaluation demonstrates decreased strength, decreased balance, decreased activity tolerance and increased pain.  Ms Herrle will benefit from skilled PT to address these issues and maximize her functional ability.   OBJECTIVE IMPAIRMENTS: Abnormal gait, decreased activity tolerance, decreased balance, difficulty walking, decreased strength, and  pain.   ACTIVITY LIMITATIONS: carrying, lifting, bending, standing, squatting, stairs, and locomotion level  PARTICIPATION LIMITATIONS: meal prep, cleaning, and shopping   REHAB POTENTIAL: Good  CLINICAL DECISION MAKING: Evolving/moderate complexity  EVALUATION COMPLEXITY: Moderate   GOALS: Goals reviewed with patient? Yes  SHORT TERM GOALS: Target date: 11/29/2023 PT to be I in HEP in order to reduce her pain to no greater than a 5/10 in her RT LE  Baseline: Goal status: INITIAL  2.  Pt strength to be increased by 1/2 grade to allow pt to go up and down a flight of steps reciprocally Baseline:  Goal status: INITIAL  3.  PT to be able to single leg stance for at least  20   seconds to decrease risk of falls.  Baseline:  Goal status: INITIAL   LONG TERM GOALS: Target date: 12/20/2023  PT to be I in an advanced HEP in order to reduce her pain to no greater than a 3/10  Baseline:  Goal status: INITIAL  2.  Pt strength to be increased by 1 grade to allow pt to squat to the floor and return without UE assist  Baseline:  Goal status: INITIAL  3.  PT to be able to single leg stance for at least  30  seconds to decrease risk of falls.  Baseline:  Goal status: INITIAL   PLAN:  PT FREQUENCY: 2x/week  PT DURATION: 6 weeks  PLANNED INTERVENTIONS: 97110-Therapeutic exercises, 97530- Therapeutic activity, [id number]- Neuromuscular re-education, 97535- Self Care, and 16109- Manual therapy  PLAN FOR NEXT SESSION: Continue with skilled PT to progress pt functional ability  Becky Sax, LPTA/CLT;  [facility] [phone]  11/23/2023, 11:31 AM

## 2023-11-25 ENCOUNTER — Telehealth (HOSPITAL_COMMUNITY): Payer: Self-pay

## 2023-11-25 ENCOUNTER — Encounter (HOSPITAL_COMMUNITY)

## 2023-11-25 ENCOUNTER — Other Ambulatory Visit: Payer: Self-pay | Admitting: Family Medicine

## 2023-11-25 NOTE — Telephone Encounter (Signed)
 Continue the current treatment regimen. No changes. It appears that trazodone 150 mg was refilled on 11/21/2023.

## 2023-11-25 NOTE — Telephone Encounter (Signed)
 No show #1, called concerning missed apt with no answer and voice mail has not been set up.   Becky Sax, LPTA/CLT; [place] [phone]

## 2023-11-28 ENCOUNTER — Ambulatory Visit (HOSPITAL_COMMUNITY): Admitting: Physical Therapy

## 2023-11-28 DIAGNOSIS — M7071 Other bursitis of hip, right hip: Secondary | ICD-10-CM

## 2023-11-28 DIAGNOSIS — M25551 Pain in right hip: Secondary | ICD-10-CM

## 2023-11-28 DIAGNOSIS — M6281 Muscle weakness (generalized): Secondary | ICD-10-CM

## 2023-11-28 DIAGNOSIS — G8929 Other chronic pain: Secondary | ICD-10-CM

## 2023-11-28 NOTE — Therapy (Signed)
 OUTPATIENT PHYSICAL THERAPY LOWER EXTREMITY TREATMENT   Patient Name: Gabriella Schmidt MRN: [id number] DOB:1980/11/20, 43 y.o., female Today's Date: 11/28/2023  END OF SESSION:  PT End of Session - 11/28/23 0928     Visit Number 3    Number of Visits 12    Date for PT Re-Evaluation 12/20/23    Authorization Type [facility]    Authorization Time Period No auth; no visit limit    PT Start Time [phone]    PT Stop Time 0930    PT Time Calculation (min) 44 min    Activity Tolerance Patient tolerated treatment well    Behavior During Therapy [facility] for tasks assessed/performed              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   Hypertension    Pre-diabetes    Past Surgical History:  Procedure Laterality Date   ROBOTIC ASSISTED LAPAROSCOPIC HYSTERECTOMY AND SALPINGECTOMY Bilateral 05/17/2023   Procedure: XI ROBOTIC ASSISTED LAPAROSCOPIC HYSTERECTOMY AND SALPINGECTOMY;  Surgeon: Myna Hidalgo, DO;  Location: [facility];  Service: Gynecology;  Laterality: Bilateral;   TOOTH EXTRACTION     TUBAL LIGATION N/A 06/29/2020   Procedure: POST PARTUM TUBAL LIGATION;  Surgeon: Tereso Newcomer, MD;  Location: [facility];  Service: Gynecology;  Laterality: N/A;   Patient Active Problem List   Diagnosis Date Noted   Patellofemoral pain syndrome 08/30/2023   Right foot pain 06/10/2023   Dysmenorrhea 05/17/2023   Trochanteric bursitis of both hips 03/22/2023   Encounter for gynecological examination with Papanicolaou smear of cervix 12/14/2022   Hyperglycemia 10/19/2022   High risk medication use 09/28/2022   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/2022   Bilateral sacroiliitis (HCC) 07/27/2022   CRP elevated 07/27/2022   Positive ANA  (antinuclear antibody) 07/27/2022   Pain of both breasts 07/07/2022   Pregnancy examination or test, negative result 07/07/2022   Prolonged periods 07/07/2022   Menorrhagia with regular cycle 03/31/2022   Uterine cramping 03/31/2022   Dyspareunia, female 03/17/2022   Urinary incontinence 03/17/2022   Hematuria 03/17/2022   Overweight (BMI 25.0-29.9) 01/05/2022   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   School health examination 12/10/2021   Weight gain 11/23/2021   Fatigue 11/23/2021   Nausea 11/23/2021   Breast tenderness 11/23/2021   Mass of upper outer quadrant of left breast 11/23/2021   Mass of lower inner quadrant of right breast 11/23/2021   Proteinuria 11/23/2021   Urinary frequency 11/23/2021   Pregnancy test negative 11/23/2021   Anemia 09/02/2021   COVID-19 04/17/2021   Insomnia 01/14/2021   Back pain 01/14/2021   Liver function test abnormality 10/06/2020   Hyperlipidemia 10/06/2020   Dizziness 10/06/2020   Degenerative disc disease at L5-S1 level 10/06/2020   Asthma 09/01/2020   Chronic hypertension 01/31/2020   Depression with anxiety 01/31/2020    PCP: Gilmore Laroche  REFERRING PROVIDER: Claudette Laws  REFERRING DIAG:  Diagnosis  M25.562,G89.29 (ICD-10-CM) - Chronic pain of left knee  M70.61,M70.62 (ICD-10-CM) - Trochanteric bursitis of both hips    THERAPY DIAG:  Diagnosis  M25.562,G89.29 (ICD-10-CM) - Chronic pain of left knee  M70.61,M70.62 (ICD-10-CM) - Trochanteric bursitis of both hips  Rationale for Evaluation and Treatment: Rehabilitation  ONSET DATE: 09/22/23  SUBJECTIVE:   SUBJECTIVE STATEMENT: Pt states that her knees feel like they are going to lock up on her.    PERTINENT HISTORY: See above  PAIN:  Are you having pain? More feeling like her leg is going to lock up than pain PRECAUTIONS: None  RED FLAGS: None   WEIGHT BEARING RESTRICTIONS: No  FALLS:  Has patient fallen in last 6 months? No  LIVING  ENVIRONMENT: Lives with: lives with their family Lives in: House/apartment Stairs: Yes: Internal: 0 steps; and External: 9 steps; on right going up Has following equipment at home: None  OCCUPATION: goes to [employer]   PLOF: Independent  PATIENT GOALS: less pain, be able to walk more and go up and down steps easier.   NEXT MD VISIT: not schedue  OBJECTIVE:  Note: Objective measures were completed at Evaluation unless otherwise noted.  DIAGNOSTIC FINDINGS: IMPRESSION: Minimal medial and patellofemoral compartment osteoarthritis.     Electronically Signed   By: Neita Garnet M.D.   MUSCLE LENGTH: Hamstrings: Right 160 deg; Left 145 deg   POSTURE: No Significant postural limitations  PALPATION: Tender to palpation of both the knee and hip   LOWER EXTREMITY ROM: WFL   LOWER EXTREMITY MMT:  MMT Right eval Left eval  Hip flexion 5 3+  Hip extension 5 3  Hip abduction 4- 3+  Hip adduction    Hip internal rotation    Hip external rotation    Knee flexion 4+ 3+  Knee extension 5 3+ give way due to pain   Ankle dorsiflexion 5 3  Ankle plantarflexion    Ankle inversion    Ankle eversion     (Blank rows = not tested)  FUNCTIONAL TESTS:  30 seconds chair stand test: 7 ;  16 is poor for age and sex;  63 is average  2 minute walk test: 446 ft  SLS:  RT: 15"  ,  LT:   12"                                                                                                                                 TREATMENT DATE:  11/28/23 Nustep level 3 x 5 minutes seat at 9 Rocker board x 2 minutes.   Heel raise x 10 Squat x 10  Step up x 10 B Sit to stand x 10  Quad stretch 3 x 30 B Hamstring stretch on 12 stool 3x 30"  Lunge B x 10  Leg press 3Pl x 10  11/23/23:  Reviewed goals Educated importance of HEP compliance for maximal benefits STS eccentric control with no HHA 10x LAQ 10x  Supine: Bridge with GTB around thighs Sidelying: Clam with GTB 10x 5" (fatigue at rep  7) Standing: Heel raises 15x Toe raises 15x Abduction 10x Extension 10x Slant board 3x 30" SLS Rt 21"Lt 18" Squat 2 set 5 reps front  of chair (cueing for mechanics)  PATIENT EDUCATION:  Education details: HEP Person educated: Patient Education method: Chief Technology Officer Education comprehension: verbalized understanding and returned demonstration  HOME EXERCISE PROGRAM: Access Code: [email or web address] URL: [email or web address] Date: 11/08/2023 Prepared by: Virgina Organ  Exercises - Sit to Stand  - 3 x daily - 7 x weekly - 1 sets - 10 reps - Standing Single Leg Heel Raise  - 2 x daily - 7 x weekly - 1 sets - 5 reps - Seated Long Arc Quad  - 3 x daily - 7 x weekly - 1 sets - 10 reps - 5" hold  11/23/23: - Supine Bridge with Resistance Band  - 1 x daily - 7 x weekly - 3 sets - 10 reps - Clam with Resistance  - 1 x daily - 7 x weekly - 3 sets - 10 reps ASSESSMENT:  CLINICAL IMPRESSION:  Therapist progressed wt bearing strengthening exercises.  Progressed HEP.  Pt needs minimal cuing for correct form but able to obtained once cued.  Pt continues to have decreased strength, decreased balance, decreased activity tolerance and increased pain.  Ms Johndrow will benefit from skilled PT to address these issues and maximize her functional ability.   OBJECTIVE IMPAIRMENTS: Abnormal gait, decreased activity tolerance, decreased balance, difficulty walking, decreased strength, and pain.   ACTIVITY LIMITATIONS: carrying, lifting, bending, standing, squatting, stairs, and locomotion level  PARTICIPATION LIMITATIONS: meal prep, cleaning, and shopping   REHAB POTENTIAL: Good  CLINICAL DECISION MAKING: Evolving/moderate complexity  EVALUATION COMPLEXITY: Moderate   GOALS: Goals reviewed with patient? Yes  SHORT TERM GOALS: Target date: 11/29/2023 PT to be I in HEP in order to reduce her pain to no greater than a 5/10 in her RT LE  Baseline: Goal status: on going  2.  Pt  strength to be increased by 1/2 grade to allow pt to go up and down a flight of steps reciprocally Baseline:  Goal status: on going  3.  PT to be able to single leg stance for at least  20   seconds to decrease risk of falls.  Baseline:  Goal status: on going   LONG TERM GOALS: Target date: 12/20/2023  PT to be I in an advanced HEP in order to reduce her pain to no greater than a 3/10  Baseline:  Goal status: on going  2.  Pt strength to be increased by 1 grade to allow pt to squat to the floor and return without UE assist  Baseline:  Goal status: on going  3.  PT to be able to single leg stance for at least  30  seconds to decrease risk of falls.  Baseline:  Goal status: on going   PLAN:  PT FREQUENCY: 2x/week  PT DURATION: 6 weeks  PLANNED INTERVENTIONS: 97110-Therapeutic exercises, 97530- Therapeutic activity, [id number]- Neuromuscular re-education, 97535- Self Care, and 86578- Manual therapy  PLAN FOR NEXT SESSION: add ITB stretch.  Continue with skilled PT to progress pt functional ability  Virgina Organ, [place] CLT [phone]  11/28/2023, 9:31 AM

## 2023-12-02 ENCOUNTER — Ambulatory Visit (HOSPITAL_COMMUNITY): Admitting: Physical Therapy

## 2023-12-02 DIAGNOSIS — M6281 Muscle weakness (generalized): Secondary | ICD-10-CM

## 2023-12-02 DIAGNOSIS — G8929 Other chronic pain: Secondary | ICD-10-CM

## 2023-12-02 DIAGNOSIS — M25551 Pain in right hip: Secondary | ICD-10-CM

## 2023-12-02 DIAGNOSIS — M7071 Other bursitis of hip, right hip: Secondary | ICD-10-CM

## 2023-12-02 NOTE — Therapy (Signed)
 OUTPATIENT PHYSICAL THERAPY LOWER EXTREMITY TREATMENT   Patient Name: Gabriella Schmidt MRN: [id number] DOB:Sep 06, 1981, 43 y.o., female Today's Date: 12/02/2023  END OF SESSION:  PT End of Session - 12/02/23 1057     Visit Number 4    Number of Visits 12    Date for PT Re-Evaluation 12/20/23    Authorization Type [facility]    Authorization Time Period No auth; no visit limit    Authorization - Visit Number 4    Progress Note Due on Visit 10    PT Start Time 1017    PT Stop Time 1058    PT Time Calculation (min) 41 min    Activity Tolerance Patient tolerated treatment well    Behavior During Therapy [facility] for tasks assessed/performed               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   Hypertension    Pre-diabetes    Past Surgical History:  Procedure Laterality Date   ROBOTIC ASSISTED LAPAROSCOPIC HYSTERECTOMY AND SALPINGECTOMY Bilateral 05/17/2023   Procedure: XI ROBOTIC ASSISTED LAPAROSCOPIC HYSTERECTOMY AND SALPINGECTOMY;  Surgeon: Myna Hidalgo, DO;  Location: [facility];  Service: Gynecology;  Laterality: Bilateral;   TOOTH EXTRACTION     TUBAL LIGATION N/A 06/29/2020   Procedure: POST PARTUM TUBAL LIGATION;  Surgeon: Tereso Newcomer, MD;  Location: [facility];  Service: Gynecology;  Laterality: N/A;   Patient Active Problem List   Diagnosis Date Noted   Patellofemoral pain syndrome 08/30/2023   Right foot pain 06/10/2023   Dysmenorrhea 05/17/2023   Trochanteric bursitis of both hips 03/22/2023   Encounter for gynecological examination with Papanicolaou smear of cervix 12/14/2022   Hyperglycemia 10/19/2022   High risk medication use 09/28/2022   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/2022   Bilateral  sacroiliitis (HCC) 07/27/2022   CRP elevated 07/27/2022   Positive ANA (antinuclear antibody) 07/27/2022   Pain of both breasts 07/07/2022   Pregnancy examination or test, negative result 07/07/2022   Prolonged periods 07/07/2022   Menorrhagia with regular cycle 03/31/2022   Uterine cramping 03/31/2022   Dyspareunia, female 03/17/2022   Urinary incontinence 03/17/2022   Hematuria 03/17/2022   Overweight (BMI 25.0-29.9) 01/05/2022   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   School health examination 12/10/2021   Weight gain 11/23/2021   Fatigue 11/23/2021   Nausea 11/23/2021   Breast tenderness 11/23/2021   Mass of upper outer quadrant of left breast 11/23/2021   Mass of lower inner quadrant of right breast 11/23/2021   Proteinuria 11/23/2021   Urinary frequency 11/23/2021   Pregnancy test negative 11/23/2021   Anemia 09/02/2021   COVID-19 04/17/2021   Insomnia 01/14/2021   Back pain 01/14/2021   Liver function test abnormality 10/06/2020   Hyperlipidemia 10/06/2020   Dizziness 10/06/2020   Degenerative disc disease at L5-S1 level 10/06/2020   Asthma 09/01/2020   Chronic hypertension 01/31/2020   Depression with anxiety 01/31/2020    PCP: Gilmore Laroche  REFERRING PROVIDER: Claudette Laws  REFERRING DIAG:  Diagnosis  M25.562,G89.29 (ICD-10-CM) - Chronic pain of left knee  M70.61,M70.62 (ICD-10-CM) - Trochanteric bursitis of both hips    THERAPY DIAG:  Diagnosis  M25.562,G89.29 (ICD-10-CM) -  Chronic pain of left knee  M70.61,M70.62 (ICD-10-CM) - Trochanteric bursitis of both hips    Rationale for Evaluation and Treatment: Rehabilitation  ONSET DATE: 09/22/23  SUBJECTIVE:   SUBJECTIVE STATEMENT:  Pt states she is sore and tired. PERTINENT HISTORY: See above  PAIN:  Are you having pain? 7/10  PRECAUTIONS: None  RED FLAGS: None   WEIGHT BEARING RESTRICTIONS: No  FALLS:  Has patient fallen in last 6 months? No  LIVING ENVIRONMENT: Lives with: lives  with their family Lives in: House/apartment Stairs: Yes: Internal: 0 steps; and External: 9 steps; on right going up Has following equipment at home: None  OCCUPATION: goes to [employer]   PLOF: Independent  PATIENT GOALS: less pain, be able to walk more and go up and down steps easier.   NEXT MD VISIT: not schedue  OBJECTIVE:  Note: Objective measures were completed at Evaluation unless otherwise noted.  DIAGNOSTIC FINDINGS: IMPRESSION: Minimal medial and patellofemoral compartment osteoarthritis.     Electronically Signed   By: Neita Garnet M.D.   MUSCLE LENGTH: Hamstrings: Right 160 deg; Left 145 deg   POSTURE: No Significant postural limitations  PALPATION: Tender to palpation of both the knee and hip   LOWER EXTREMITY ROM: WFL   LOWER EXTREMITY MMT:  MMT Right eval Left eval  Hip flexion 5 3+  Hip extension 5 3  Hip abduction 4- 3+  Hip adduction    Hip internal rotation    Hip external rotation    Knee flexion 4+ 3+  Knee extension 5 3+ give way due to pain   Ankle dorsiflexion 5 3  Ankle plantarflexion    Ankle inversion    Ankle eversion     (Blank rows = not tested)  FUNCTIONAL TESTS:  30 seconds chair stand test: 7 ;  16 is poor for age and sex;  20 is average  2 minute walk test: 446 ft  SLS:  RT: 15"  ,  LT:   12"                                                                                                                                 TREATMENT DATE:  12/02/23 Heel raise x 15 Rocker board x 2 minutes Step up 6" B x 10  Lateral step up 2" B x 10  Squat B x 15 Lt knee flexion with 3# x 10  Single leg stance B  x 3 Lunge x 10  Side step with green thera-band  x 1 Sit to stand x 15 Lunge x 10 B onto 6" step  Slant board 2 x 30" Leg press PL3 x 15 Nu step level 3 x 6:00  11/28/23 Nustep level 3 x 5 minutes seat at 9 Rocker board x 2 minutes.   Heel raise x 10 Squat x 10  Step up x 10 B Sit to stand x 10  Quad stretch 3 x 30  B Hamstring stretch on 12 stool 3x 30"  Lunge B x 10  Leg press 3Pl x 10 ITB stretch as wall x 2 B 30"   PATIENT EDUCATION:  Education details: HEP Person educated: Patient Education method: Chief Technology Officer Education comprehension: verbalized understanding and returned demonstration  HOME EXERCISE PROGRAM: Access Code: [email or web address] URL: [email or web address] Date: 11/08/2023 Prepared by: Virgina Organ  Exercises - Sit to Stand  - 3 x daily - 7 x weekly - 1 sets - 10 reps - Standing Single Leg Heel Raise  - 2 x daily - 7 x weekly - 1 sets - 5 reps - Seated Long Arc Quad  - 3 x daily - 7 x weekly - 1 sets - 10 reps - 5" hold  11/23/23: - Supine Bridge with Resistance Band  - 1 x daily - 7 x weekly - 3 sets - 10 reps - Clam with Resistance  - 1 x daily - 7 x weekly - 3 sets - 10 reps ASSESSMENT:  CLINICAL IMPRESSION:  Added IT band stretch, resisted side step,single leg stance and lateral step up to program.  PT able to tolerate all but had to decrease to a 2" step with lateral step ups due to discomfort.  .  Pt needs minimal cuing for correct form but able to obtained once cued.  Pt continues to have decreased strength, decreased balance, decreased activity tolerance and increased pain.  Ms Critz will benefit from skilled PT to address these issues and maximize her functional ability.   OBJECTIVE IMPAIRMENTS: Abnormal gait, decreased activity tolerance, decreased balance, difficulty walking, decreased strength, and pain.   ACTIVITY LIMITATIONS: carrying, lifting, bending, standing, squatting, stairs, and locomotion level  PARTICIPATION LIMITATIONS: meal prep, cleaning, and shopping   REHAB POTENTIAL: Good  CLINICAL DECISION MAKING: Evolving/moderate complexity  EVALUATION COMPLEXITY: Moderate   GOALS: Goals reviewed with patient? Yes  SHORT TERM GOALS: Target date: 11/29/2023 PT to be I in HEP in order to reduce her pain to no greater than a 5/10 in  her RT LE  Baseline: Goal status: on going  2.  Pt strength to be increased by 1/2 grade to allow pt to go up and down a flight of steps reciprocally Baseline:  Goal status: on going  3.  PT to be able to single leg stance for at least  20   seconds to decrease risk of falls.  Baseline:  Goal status: on going   LONG TERM GOALS: Target date: 12/20/2023  PT to be I in an advanced HEP in order to reduce her pain to no greater than a 3/10  Baseline:  Goal status: on going  2.  Pt strength to be increased by 1 grade to allow pt to squat to the floor and return without UE assist  Baseline:  Goal status: on going  3.  PT to be able to single leg stance for at least  30  seconds to decrease risk of falls.  Baseline:  Goal status: on going   PLAN:  PT FREQUENCY: 2x/week  PT DURATION: 6 weeks  PLANNED INTERVENTIONS: 97110-Therapeutic exercises, 97530- Therapeutic activity, [id number]- Neuromuscular re-education, 97535- Self Care, and 28413- Manual therapy  PLAN FOR NEXT SESSION:   Continue with skilled PT to progress pt functional ability  Virgina Organ, PT CLT [phone]  12/02/2023, 10:58 AM

## 2023-12-04 ENCOUNTER — Other Ambulatory Visit: Payer: Self-pay | Admitting: Internal Medicine

## 2023-12-04 DIAGNOSIS — M45A7 Non-radiographic axial spondyloarthritis of lumbosacral region: Secondary | ICD-10-CM

## 2023-12-05 ENCOUNTER — Encounter (HOSPITAL_COMMUNITY): Payer: Self-pay

## 2023-12-05 ENCOUNTER — Ambulatory Visit (HOSPITAL_COMMUNITY)

## 2023-12-05 DIAGNOSIS — M6281 Muscle weakness (generalized): Secondary | ICD-10-CM | POA: Diagnosis not present

## 2023-12-05 DIAGNOSIS — M7071 Other bursitis of hip, right hip: Secondary | ICD-10-CM

## 2023-12-05 DIAGNOSIS — M25551 Pain in right hip: Secondary | ICD-10-CM

## 2023-12-05 DIAGNOSIS — G8929 Other chronic pain: Secondary | ICD-10-CM

## 2023-12-05 NOTE — Therapy (Signed)
 OUTPATIENT PHYSICAL THERAPY LOWER EXTREMITY TREATMENT   Patient Name: Gabriella Schmidt MRN: [id number] DOB:12/31/1980, 43 y.o., female Today's Date: 12/05/2023  END OF SESSION:  PT End of Session - 12/05/23 0846     Visit Number 5    Number of Visits 12    Date for PT Re-Evaluation 12/20/23    Authorization Type [facility]    Authorization Time Period No auth; no visit limit    Progress Note Due on Visit 10    PT Start Time 0846    PT Stop Time 0933    PT Time Calculation (min) 47 min               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   Hypertension    Pre-diabetes    Past Surgical History:  Procedure Laterality Date   ROBOTIC ASSISTED LAPAROSCOPIC HYSTERECTOMY AND SALPINGECTOMY Bilateral 05/17/2023   Procedure: XI ROBOTIC ASSISTED LAPAROSCOPIC HYSTERECTOMY AND SALPINGECTOMY;  Surgeon: Myna Hidalgo, DO;  Location: [facility];  Service: Gynecology;  Laterality: Bilateral;   TOOTH EXTRACTION     TUBAL LIGATION N/A 06/29/2020   Procedure: POST PARTUM TUBAL LIGATION;  Surgeon: Tereso Newcomer, MD;  Location: [facility];  Service: Gynecology;  Laterality: N/A;   Patient Active Problem List   Diagnosis Date Noted   Patellofemoral pain syndrome 08/30/2023   Right foot pain 06/10/2023   Dysmenorrhea 05/17/2023   Trochanteric bursitis of both hips 03/22/2023   Encounter for gynecological examination with Papanicolaou smear of cervix 12/14/2022   Hyperglycemia 10/19/2022   High risk medication use 09/28/2022   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/2022   Bilateral sacroiliitis (HCC) 07/27/2022   CRP elevated 07/27/2022   Positive ANA (antinuclear antibody) 07/27/2022   Pain of both breasts 07/07/2022   Pregnancy  examination or test, negative result 07/07/2022   Prolonged periods 07/07/2022   Menorrhagia with regular cycle 03/31/2022   Uterine cramping 03/31/2022   Dyspareunia, female 03/17/2022   Urinary incontinence 03/17/2022   Hematuria 03/17/2022   Overweight (BMI 25.0-29.9) 01/05/2022   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   School health examination 12/10/2021   Weight gain 11/23/2021   Fatigue 11/23/2021   Nausea 11/23/2021   Breast tenderness 11/23/2021   Mass of upper outer quadrant of left breast 11/23/2021   Mass of lower inner quadrant of right breast 11/23/2021   Proteinuria 11/23/2021   Urinary frequency 11/23/2021   Pregnancy test negative 11/23/2021   Anemia 09/02/2021   COVID-19 04/17/2021   Insomnia 01/14/2021   Back pain 01/14/2021   Liver function test abnormality 10/06/2020   Hyperlipidemia 10/06/2020   Dizziness 10/06/2020   Degenerative disc disease at L5-S1 level 10/06/2020   Asthma 09/01/2020   Chronic hypertension 01/31/2020   Depression with anxiety 01/31/2020    PCP: Gilmore Laroche  REFERRING PROVIDER: Claudette Laws  REFERRING DIAG:  Diagnosis  M25.562,G89.29 (ICD-10-CM) - Chronic pain of left knee  M70.61,M70.62 (ICD-10-CM) - Trochanteric bursitis of both hips    THERAPY DIAG:  Diagnosis  M25.562,G89.29 (ICD-10-CM) - Chronic pain of left knee  M70.61,M70.62 (ICD-10-CM) - Trochanteric bursitis of both hips    Rationale for Evaluation and Treatment: Rehabilitation  ONSET DATE:  09/22/23  SUBJECTIVE:   SUBJECTIVE STATEMENT:  Pt stated she had increased pain over the weekend, feels the weather plays a part.  Feeling good this morning, no reports of pain currently.   PERTINENT HISTORY: See above  PAIN:  Are you having pain? 0/10  PRECAUTIONS: None  RED FLAGS: None   WEIGHT BEARING RESTRICTIONS: No  FALLS:  Has patient fallen in last 6 months? No  LIVING ENVIRONMENT: Lives with: lives with their family Lives in:  House/apartment Stairs: Yes: Internal: 0 steps; and External: 9 steps; on right going up Has following equipment at home: None  OCCUPATION: goes to [employer]   PLOF: Independent  PATIENT GOALS: less pain, be able to walk more and go up and down steps easier.   NEXT MD VISIT: not schedue  OBJECTIVE:  Note: Objective measures were completed at Evaluation unless otherwise noted.  DIAGNOSTIC FINDINGS: IMPRESSION: Minimal medial and patellofemoral compartment osteoarthritis.     Electronically Signed   By: Neita Garnet M.D.   MUSCLE LENGTH: Hamstrings: Right 160 deg; Left 145 deg   POSTURE: No Significant postural limitations  PALPATION: Tender to palpation of both the knee and hip   LOWER EXTREMITY ROM: WFL   LOWER EXTREMITY MMT:  MMT Right eval Left eval  Hip flexion 5 3+  Hip extension 5 3  Hip abduction 4- 3+  Hip adduction    Hip internal rotation    Hip external rotation    Knee flexion 4+ 3+  Knee extension 5 3+ give way due to pain   Ankle dorsiflexion 5 3  Ankle plantarflexion    Ankle inversion    Ankle eversion     (Blank rows = not tested)  FUNCTIONAL TESTS:  30 seconds chair stand test: 7 ;  16 is poor for age and sex;  58 is average  2 minute walk test: 446 ft  SLS:  RT: 15"  ,  LT:   12"                                                                                                                                 TREATMENT DATE:  12/05/23: Rockerboard 2 Rt/Lt and Df/Pf 2' each Sit to stand then heel raise 2x 10 (RTB around thigh 2nd set) Sidestep with theraband around thigh 2RT Squat front of chair 2x 10 C/o dizziness.  Given water, sat and BP taken: 134/82 mmHg HR 89 bpm Lunge to floor with foam 3 sets x 5 reps Vector stance 3x 5" Lateral step up 2in step 15x  12/02/23 Heel raise x 15 Rocker board x 2 minutes Step up 6" B x 10  Lateral step up 2" B x 10  Squat B x 15 Lt knee flexion with 3# x 10  Single leg stance B  x 3 Lunge x 10   Side step with green thera-band  x 1 Sit to stand x 15 Lunge x 10 B  onto 6" step  Slant board 2 x 30" Leg press PL3 x 15 Nu step level 3 x 6:00  11/28/23 Nustep level 3 x 5 minutes seat at 9 Rocker board x 2 minutes.   Heel raise x 10 Squat x 10  Step up x 10 B Sit to stand x 10  Quad stretch 3 x 30 B Hamstring stretch on 12 stool 3x 30"  Lunge B x 10  Leg press 3Pl x 10 ITB stretch as wall x 2 B 30"   PATIENT EDUCATION:  Education details: HEP Person educated: Patient Education method: Chief Technology Officer Education comprehension: verbalized understanding and returned demonstration  HOME EXERCISE PROGRAM: Access Code: [email or web address] URL: [email or web address] Date: 11/08/2023 Prepared by: Virgina Organ  Exercises - Sit to Stand  - 3 x daily - 7 x weekly - 1 sets - 10 reps - Standing Single Leg Heel Raise  - 2 x daily - 7 x weekly - 1 sets - 5 reps - Seated Long Arc Quad  - 3 x daily - 7 x weekly - 1 sets - 10 reps - 5" hold  11/23/23: - Supine Bridge with Resistance Band  - 1 x daily - 7 x weekly - 3 sets - 10 reps - Clam with Resistance  - 1 x daily - 7 x weekly - 3 sets - 10 reps ASSESSMENT:  CLINICAL IMPRESSION:  Session focus with hip stability and functional strengthening.  Added deeper lunges to assist with floor to standing transfer with some cueing required.   Pt required some cueing to improve form and mechanics initially then able to show good mechanics following.  No reports of increased pain through session.    OBJECTIVE IMPAIRMENTS: Abnormal gait, decreased activity tolerance, decreased balance, difficulty walking, decreased strength, and pain.   ACTIVITY LIMITATIONS: carrying, lifting, bending, standing, squatting, stairs, and locomotion level  PARTICIPATION LIMITATIONS: meal prep, cleaning, and shopping   REHAB POTENTIAL: Good  CLINICAL DECISION MAKING: Evolving/moderate complexity  EVALUATION COMPLEXITY:  Moderate   GOALS: Goals reviewed with patient? Yes  SHORT TERM GOALS: Target date: 11/29/2023 PT to be I in HEP in order to reduce her pain to no greater than a 5/10 in her RT LE  Baseline: Goal status: on going  2.  Pt strength to be increased by 1/2 grade to allow pt to go up and down a flight of steps reciprocally Baseline:  Goal status: on going  3.  PT to be able to single leg stance for at least  20   seconds to decrease risk of falls.  Baseline:  Goal status: on going   LONG TERM GOALS: Target date: 12/20/2023  PT to be I in an advanced HEP in order to reduce her pain to no greater than a 3/10  Baseline:  Goal status: on going  2.  Pt strength to be increased by 1 grade to allow pt to squat to the floor and return without UE assist  Baseline:  Goal status: on going  3.  PT to be able to single leg stance for at least  30  seconds to decrease risk of falls.  Baseline:  Goal status: on going   PLAN:  PT FREQUENCY: 2x/week  PT DURATION: 6 weeks  PLANNED INTERVENTIONS: 97110-Therapeutic exercises, 97530- Therapeutic activity, [id number]- Neuromuscular re-education, 97535- Self Care, and 13086- Manual therapy  PLAN FOR NEXT SESSION:   Continue with skilled PT to progress pt functional ability  Becky Sax, LPTA/CLT; [facility]IS [phone]  12/05/2023, 10:37  AM

## 2023-12-05 NOTE — Telephone Encounter (Signed)
 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

## 2023-12-06 ENCOUNTER — Encounter: Admitting: Physical Medicine & Rehabilitation

## 2023-12-07 ENCOUNTER — Ambulatory Visit (HOSPITAL_COMMUNITY): Admitting: Physical Therapy

## 2023-12-07 DIAGNOSIS — M6281 Muscle weakness (generalized): Secondary | ICD-10-CM | POA: Diagnosis not present

## 2023-12-07 DIAGNOSIS — M7071 Other bursitis of hip, right hip: Secondary | ICD-10-CM

## 2023-12-07 DIAGNOSIS — M25551 Pain in right hip: Secondary | ICD-10-CM

## 2023-12-07 DIAGNOSIS — G8929 Other chronic pain: Secondary | ICD-10-CM

## 2023-12-07 NOTE — Therapy (Signed)
 OUTPATIENT PHYSICAL THERAPY LOWER EXTREMITY TREATMENT   Patient Name: Gabriella Schmidt MRN: [id number] DOB:October 04, 1980, 43 y.o., female Today's Date: 12/07/2023  END OF SESSION:      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   Hypertension    Pre-diabetes    Past Surgical History:  Procedure Laterality Date   ROBOTIC ASSISTED LAPAROSCOPIC HYSTERECTOMY AND SALPINGECTOMY Bilateral 05/17/2023   Procedure: XI ROBOTIC ASSISTED LAPAROSCOPIC HYSTERECTOMY AND SALPINGECTOMY;  Surgeon: Myna Hidalgo, DO;  Location: [facility];  Service: Gynecology;  Laterality: Bilateral;   TOOTH EXTRACTION     TUBAL LIGATION N/A 06/29/2020   Procedure: POST PARTUM TUBAL LIGATION;  Surgeon: Tereso Newcomer, MD;  Location: [facility];  Service: Gynecology;  Laterality: N/A;   Patient Active Problem List   Diagnosis Date Noted   Patellofemoral pain syndrome 08/30/2023   Right foot pain 06/10/2023   Dysmenorrhea 05/17/2023   Trochanteric bursitis of both hips 03/22/2023   Encounter for gynecological examination with Papanicolaou smear of cervix 12/14/2022   Hyperglycemia 10/19/2022   High risk medication use 09/28/2022   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/2022   Bilateral sacroiliitis (HCC) 07/27/2022   CRP elevated 07/27/2022   Positive ANA (antinuclear antibody) 07/27/2022   Pain of both breasts 07/07/2022   Pregnancy examination or test, negative result 07/07/2022   Prolonged periods 07/07/2022   Menorrhagia with regular cycle 03/31/2022   Uterine cramping 03/31/2022   Dyspareunia, female 03/17/2022   Urinary incontinence 03/17/2022   Hematuria 03/17/2022   Overweight (BMI 25.0-29.9) 01/05/2022   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   School  health examination 12/10/2021   Weight gain 11/23/2021   Fatigue 11/23/2021   Nausea 11/23/2021   Breast tenderness 11/23/2021   Mass of upper outer quadrant of left breast 11/23/2021   Mass of lower inner quadrant of right breast 11/23/2021   Proteinuria 11/23/2021   Urinary frequency 11/23/2021   Pregnancy test negative 11/23/2021   Anemia 09/02/2021   COVID-19 04/17/2021   Insomnia 01/14/2021   Back pain 01/14/2021   Liver function test abnormality 10/06/2020   Hyperlipidemia 10/06/2020   Dizziness 10/06/2020   Degenerative disc disease at L5-S1 level 10/06/2020   Asthma 09/01/2020   Chronic hypertension 01/31/2020   Depression with anxiety 01/31/2020    PCP: Gilmore Laroche  REFERRING PROVIDER: Claudette Laws  REFERRING DIAG:  Diagnosis  M25.562,G89.29 (ICD-10-CM) - Chronic pain of left knee  M70.61,M70.62 (ICD-10-CM) - Trochanteric bursitis of both hips    THERAPY DIAG:  Diagnosis  M25.562,G89.29 (ICD-10-CM) - Chronic pain of left knee  M70.61,M70.62 (ICD-10-CM) - Trochanteric bursitis of both hips    Rationale for Evaluation and Treatment: Rehabilitation  ONSET DATE: 09/22/23  SUBJECTIVE:   SUBJECTIVE STATEMENT:   Pt states her Lt knee and hip are the worst, gets some patellar pain in her Rt knee.  No reports of pain currently.    PERTINENT HISTORY: See above  PAIN:  Are you having pain? 0/10  PRECAUTIONS: None  RED FLAGS: None   WEIGHT BEARING RESTRICTIONS: No  FALLS:  Has patient fallen in last 6 months? No  LIVING ENVIRONMENT: Lives with: lives with their family Lives in: House/apartment Stairs: Yes: Internal: 0 steps;  and External: 9 steps; on right going up Has following equipment at home: None  OCCUPATION: goes to [employer]   PLOF: Independent  PATIENT GOALS: less pain, be able to walk more and go up and down steps easier.   NEXT MD VISIT: not schedue  OBJECTIVE:  Note: Objective measures were completed at Evaluation unless otherwise  noted.  DIAGNOSTIC FINDINGS: IMPRESSION: Minimal medial and patellofemoral compartment osteoarthritis.     Electronically Signed   By: Neita Garnet M.D.   MUSCLE LENGTH: Hamstrings: Right 160 deg; Left 145 deg   POSTURE: No Significant postural limitations  PALPATION: Tender to palpation of both the knee and hip   LOWER EXTREMITY ROM: WFL   LOWER EXTREMITY MMT:  MMT Right eval Left eval  Hip flexion 5 3+  Hip extension 5 3  Hip abduction 4- 3+  Hip adduction    Hip internal rotation    Hip external rotation    Knee flexion 4+ 3+  Knee extension 5 3+ give way due to pain   Ankle dorsiflexion 5 3  Ankle plantarflexion    Ankle inversion    Ankle eversion     (Blank rows = not tested)  FUNCTIONAL TESTS:  30 seconds chair stand test: 7 ;  16 is poor for age and sex;  51 is average  2 minute walk test: 446 ft  SLS:  RT: 15"  ,  LT:   12"                                                                                                                                 TREATMENT DATE:  12/07/23 Standing: lateral step ups 4" 10X bilaterally  Forward step ups 4" 10X  Lunge onto 4" step 10X no UE assist  Squats 10X in good form  Vectors 2 sets 5X5" each with 1 UE assist  Side stepping with RTB around thighs 2RT on 20 foot line  Sit to stand into heelraise with RTB around thighs 2X10  12/05/23: Rockerboard 2 Rt/Lt and Df/Pf 2' each Sit to stand then heel raise 2x 10 (RTB around thigh 2nd set) Sidestep with theraband around thigh 2RT Squat front of chair 2x 10 C/o dizziness.  Given water, sat and BP taken: 134/82 mmHg HR 89 bpm Lunge to floor with foam 3 sets x 5 reps Vector stance 3x 5" Lateral step up 2in step 15x  12/02/23 Heel raise x 15 Rocker board x 2 minutes Step up 6" B x 10  Lateral step up 2" B x 10  Squat B x 15 Lt knee flexion with 3# x 10  Single leg stance B  x 3 Lunge x 10  Side step with green thera-band  x 1 Sit to stand x 15 Lunge x 10  B onto 6" step  Slant board 2 x 30" Leg press PL3 x 15 Nu step level 3 x 6:00  11/28/23 Nustep level 3  x 5 minutes seat at 9 Rocker board x 2 minutes.   Heel raise x 10 Squat x 10  Step up x 10 B Sit to stand x 10  Quad stretch 3 x 30 B Hamstring stretch on 12 stool 3x 30"  Lunge B x 10  Leg press 3Pl x 10 ITB stretch as wall x 2 B 30"   PATIENT EDUCATION:  Education details: HEP Person educated: Patient Education method: Chief Technology Officer Education comprehension: verbalized understanding and returned demonstration  HOME EXERCISE PROGRAM: Access Code: [email or web address] URL: [email or web address] Date: 11/08/2023 Prepared by: Virgina Organ  Exercises - Sit to Stand  - 3 x daily - 7 x weekly - 1 sets - 10 reps - Standing Single Leg Heel Raise  - 2 x daily - 7 x weekly - 1 sets - 5 reps - Seated Long Arc Quad  - 3 x daily - 7 x weekly - 1 sets - 10 reps - 5" hold  11/23/23: - Supine Bridge with Resistance Band  - 1 x daily - 7 x weekly - 3 sets - 10 reps - Clam with Resistance  - 1 x daily - 7 x weekly - 3 sets - 10 reps ASSESSMENT:  CLINICAL IMPRESSION:  Continued focus on improving LE strength and stability. Good form achieved with lunges and also with sit to stand to heelraise. Began lateral steps to work on eccentric strength.  No discomfort with 4" step height.  No LOB noted during any of these activities and less UE assist required today. PT verbalized fatigue at end of session today. Pt will continue to benefit from skilled therapy to progress towards goals.    OBJECTIVE IMPAIRMENTS: Abnormal gait, decreased activity tolerance, decreased balance, difficulty walking, decreased strength, and pain.   ACTIVITY LIMITATIONS: carrying, lifting, bending, standing, squatting, stairs, and locomotion level  PARTICIPATION LIMITATIONS: meal prep, cleaning, and shopping   REHAB POTENTIAL: Good  CLINICAL DECISION MAKING: Evolving/moderate complexity  EVALUATION  COMPLEXITY: Moderate   GOALS: Goals reviewed with patient? Yes  SHORT TERM GOALS: Target date: 11/29/2023 PT to be I in HEP in order to reduce her pain to no greater than a 5/10 in her RT LE  Baseline: Goal status: on going  2.  Pt strength to be increased by 1/2 grade to allow pt to go up and down a flight of steps reciprocally Baseline:  Goal status: on going  3.  PT to be able to single leg stance for at least  20   seconds to decrease risk of falls.  Baseline:  Goal status: on going   LONG TERM GOALS: Target date: 12/20/2023  PT to be I in an advanced HEP in order to reduce her pain to no greater than a 3/10  Baseline:  Goal status: on going  2.  Pt strength to be increased by 1 grade to allow pt to squat to the floor and return without UE assist  Baseline:  Goal status: on going  3.  PT to be able to single leg stance for at least  30  seconds to decrease risk of falls.  Baseline:  Goal status: on going   PLAN:  PT FREQUENCY: 2x/week  PT DURATION: 6 weeks  PLANNED INTERVENTIONS: 97110-Therapeutic exercises, 97530- Therapeutic activity, 97112- Neuromuscular re-education, 97535- Self Care, and 16109- Manual therapy  PLAN FOR NEXT SESSION:   Continue with skilled PT to progress pt functional ability  Lurena Nida, PTA/CLT [facility] Ph:  [phone]   12/07/2023, 10:25 AM

## 2023-12-08 ENCOUNTER — Encounter: Admitting: Physical Medicine & Rehabilitation

## 2023-12-08 ENCOUNTER — Other Ambulatory Visit (HOSPITAL_COMMUNITY): Payer: Self-pay | Admitting: Adult Health

## 2023-12-08 DIAGNOSIS — Z1231 Encounter for screening mammogram for malignant neoplasm of breast: Secondary | ICD-10-CM

## 2023-12-09 ENCOUNTER — Encounter: Payer: Self-pay | Admitting: Physical Medicine & Rehabilitation

## 2023-12-09 ENCOUNTER — Encounter: Attending: Physical Medicine & Rehabilitation | Admitting: Physical Medicine & Rehabilitation

## 2023-12-09 VITALS — BP 95/64 | HR 83 | Wt 172.0 lb

## 2023-12-09 DIAGNOSIS — M7061 Trochanteric bursitis, right hip: Secondary | ICD-10-CM | POA: Insufficient documentation

## 2023-12-09 DIAGNOSIS — M25562 Pain in left knee: Secondary | ICD-10-CM | POA: Insufficient documentation

## 2023-12-09 DIAGNOSIS — R768 Other specified abnormal immunological findings in serum: Secondary | ICD-10-CM | POA: Insufficient documentation

## 2023-12-09 DIAGNOSIS — G8929 Other chronic pain: Secondary | ICD-10-CM | POA: Insufficient documentation

## 2023-12-09 DIAGNOSIS — M7062 Trochanteric bursitis, left hip: Secondary | ICD-10-CM | POA: Diagnosis present

## 2023-12-09 DIAGNOSIS — M797 Fibromyalgia: Secondary | ICD-10-CM | POA: Diagnosis not present

## 2023-12-09 NOTE — Progress Notes (Signed)
 Subjective:    Patient ID: Gabriella Schmidt, female    DOB: Jan 16, 1981, 43 y.o.   MRN: [id number]  HPI 43 year-old female with history of axial spondyloarthropathy, returns today with primary complaints of left knee pain.  She has been diagnosed with left knee patellar tendinitis by rheumatology.  X-rays of the knee unremarkable.  The patient has additional pain areas in her chest, hips. We also reviewed the lumbar MRI which showed some mild lumbar spondylosis but no evidence of stenosis In addition reviewed MRI of the sacrum which showed mild inflammation however sacroiliac injections were of no significant benefit. The patient has had some ongoing abdominal pain and has had robotic assisted hysterectomy around 7 months ago.  Told by her gynecologist that her abdominal pain should not be related to her surgery at this time Physical therapy is ongoing patient feels like her mobility has been improving with this. Patient has also lost weight and she really cannot explain why other than a partial reduction of her soft drink intake.  Pain Inventory Average Pain 6 Pain Right Now 6 My pain is constant, stabbing, aching, and throb  In the last 24 hours, has pain interfered with the following? General activity 0 Relation with others 0 Enjoyment of life 2 What TIME of day is your pain at its worst? morning , daytime, evening, night, and varies Sleep (in general) Good  Pain is worse with: walking, bending, sitting, inactivity, standing, and some activites Pain improves with: rest and medication Relief from Meds: 8  Family History  Problem Relation Age of Onset   Hypertension Mother    Skin cancer Mother    Hypertension Father    Cardiomyopathy Father    Lymphoma Father    Obesity Brother    Obesity Brother    Obesity Brother    Hypertension Maternal Grandmother    Hypertension Maternal Grandfather    Healthy Daughter    Social History   Socioeconomic History   Marital status:  Married    Spouse name: Jlynn Ly   Number of children: 1   Years of education: Not on file   Highest education level: Some college, no degree  Occupational History   Not on file  Tobacco Use   Smoking status: Former    Current packs/day: 0.00    Average packs/day: 0.5 packs/day for 13.0 years (6.5 ttl pk-yrs)    Types: Cigarettes    Start date: 01/2000    Quit date: 01/2013    Years since quitting: 10.8    Passive exposure: Past   Smokeless tobacco: Never  Vaping Use   Vaping status: Former   Quit date: 09/20/2018  Substance and Sexual Activity   Alcohol use: Not Currently   Drug use: Never   Sexual activity: Yes    Birth control/protection: Surgical    Comment: Hysterectomy  Other Topics Concern   Not on file  Social History Narrative   Not on file   Social Drivers of Health   Financial Resource Strain: Low Risk  (10/20/2023)   Overall Financial Resource Strain (CARDIA)    Difficulty of Paying Living Expenses: Not hard at all  Food Insecurity: No Food Insecurity (10/20/2023)   Hunger Vital Sign    Worried About Running Out of Food in the Last Year: Never true    Ran Out of Food in the Last Year: Never true  Transportation Needs: No Transportation Needs (10/20/2023)   PRAPARE - Administrator, Civil Service (Medical):  No    Lack of Transportation (Non-Medical): No  Physical Activity: Insufficiently Active (10/20/2023)   Exercise Vital Sign    Days of Exercise per Week: 3 days    Minutes of Exercise per Session: 20 min  Stress: No Stress Concern Present (10/20/2023)   [employer] of Occupational Health - Occupational Stress Questionnaire    Feeling of Stress : Only a little  Social Connections: Socially Integrated (10/20/2023)   Social Connection and Isolation Panel [NHANES]    Frequency of Communication with Friends and Family: More than three times a week    Frequency of Social Gatherings with Friends and Family: More than three times a week     Attends Religious Services: More than 4 times per year    Active Member of Clubs or Organizations: Yes    Attends Banker Meetings: 1 to 4 times per year    Marital Status: Married   Past Surgical History:  Procedure Laterality Date   ROBOTIC ASSISTED LAPAROSCOPIC HYSTERECTOMY AND SALPINGECTOMY Bilateral 05/17/2023   Procedure: XI ROBOTIC ASSISTED LAPAROSCOPIC HYSTERECTOMY AND SALPINGECTOMY;  Surgeon: Myna Hidalgo, DO;  Location: [facility];  Service: Gynecology;  Laterality: Bilateral;   TOOTH EXTRACTION     TUBAL LIGATION N/A 06/29/2020   Procedure: POST PARTUM TUBAL LIGATION;  Surgeon: Tereso Newcomer, MD;  Location: [facility];  Service: Gynecology;  Laterality: N/A;   Past Surgical History:  Procedure Laterality Date   ROBOTIC ASSISTED LAPAROSCOPIC HYSTERECTOMY AND SALPINGECTOMY Bilateral 05/17/2023   Procedure: XI ROBOTIC ASSISTED LAPAROSCOPIC HYSTERECTOMY AND SALPINGECTOMY;  Surgeon: Myna Hidalgo, DO;  Location: [facility];  Service: Gynecology;  Laterality: Bilateral;   TOOTH EXTRACTION     TUBAL LIGATION N/A 06/29/2020   Procedure: POST PARTUM TUBAL LIGATION;  Surgeon: Tereso Newcomer, MD;  Location: [facility];  Service: Gynecology;  Laterality: N/A;   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   Hypertension    Pre-diabetes    LMP 09/20/2022 (Approximate)   Opioid Risk Score:   Fall Risk Score:  `1  Depression screen PHQ 2/9     10/25/2023    2:50 PM 06/21/2023   12:20 PM 06/07/2023    2:31 PM 03/22/2023    9:40 AM 02/22/2023   12:59 PM 01/18/2023    1:14 PM 12/21/2022   11:23 AM  Depression screen PHQ 2/9  Decreased Interest 0 0 0 1 0 1 0  Down, Depressed, Hopeless 0 0 0 1 0 0 0  PHQ - 2 Score 0 0 0 2 0 1 0  Altered sleeping      2   Tired,  decreased energy      3   Change in appetite      0   Feeling bad or failure about yourself       0   Trouble concentrating      0   Moving slowly or fidgety/restless      0   Suicidal thoughts      0   PHQ-9 Score      6   Difficult doing work/chores      Not difficult at all      Review of Systems  Musculoskeletal:  Positive for back pain.       Left hip, left knee, right knee  All other systems reviewed and are negative.      Objective:   Physical Exam Positive tender points bilateral upper trapezius bilateral sternocostal bilateral hip right elbow bilateral knee bilateral low back for a total of 11 Motor strength is 5/5 bilateral deltoid, bicep, tricep, grip, hip flexor, knee extensor, ankle dorsiflexion plantarflexion Negative straight leg raising bilaterally  Sacral thrust (prone) : Positive Lateral compression: Positive over the greater trochanters FABER's: Positive at the groin Distraction (supine): Negative Thigh thrust test: Negative  Mild pain with lumbar extension.  She has normal range of motion lumbar flexion and extension Ambulates without assistive device no evidence of toe drag or knee instability Left knee no evidence of effusion minimal joint line tenderness she actually has more pain over the vastus medialis area.  Minimal pain over the patellar tendon.     Assessment & Plan:   #1.  History of widespread body pain.  We discussed her imaging studies which were not very impressive in terms of any significant pathology in the knee, lumbar spine areas.  We discussed that meniscal problems could cause similar pain in the knee and would not cause any abnormalities in the x-ray however she does not have a history of joint effusion and has pain that is occurring also when she is in bed.  Her hip exam does not show any signs of any intra-articular issues at this time she does have some tenderness over the greater trochanters. We discussed the significance of multiple  tender points, we discussed the increased incidence of fibromyalgia syndrome in patients with autoimmune disease and that fibromyalgia itself is considered by some autoimmune disease as well. We discussed there is no gold standard lab test for fibromyalgia syndrome and that it is mainly clinically based diagnosis. She would like to discuss this with her rheumatologist. We discussed to pathways either proceeding with further diagnostic imaging of her most painful areas namely left knee as well as bilateral hips We also discussed empiric trial of duloxetine or pregabalin.  She will follow-up in 1 month

## 2023-12-09 NOTE — Patient Instructions (Signed)
 Check out [employer] [employer] for Fibromyalgia info Myofascial Pain Syndrome and Fibromyalgia Myofascial pain syndrome and fibromyalgia are both pain disorders. You may feel this pain mainly in your muscles. Myofascial pain syndrome: Always has tender points in the muscles that will cause pain when pressed (trigger points). The pain may come and go. Usually affects your neck, upper back, and shoulder areas. The pain often moves into your arms and hands. Fibromyalgia: Has muscle pains and tenderness that come and go. Is often associated with tiredness (fatigue) and sleep problems. Has trigger points. Tends to be long-lasting (chronic), but is not life-threatening. Fibromyalgia and myofascial pain syndrome are not the same. However, they often occur together. If you have both conditions, each can make the other worse. Both are common and can cause enough pain and fatigue to make day-to-day activities difficult. Both can be hard to diagnose because their symptoms are common in many other conditions. What are the causes? The exact causes of these conditions are not known. What increases the risk? You are more likely to develop either of these conditions if: You have a family history of the condition. You are female. You have certain triggers, such as: Spine disorders. An injury (trauma) or other physical stressors. Being under a lot of stress. Medical conditions such as osteoarthritis, rheumatoid arthritis, or lupus. What are the signs or symptoms? Fibromyalgia The main symptom of fibromyalgia is widespread pain and tenderness in your muscles. Pain is sometimes described as stabbing, shooting, or burning. You may also have: Tingling or numbness. Sleep problems and fatigue. Problems with attention and concentration (fibro fog). Other symptoms may include: Bowel and bladder problems. Headaches. Vision problems. Sensitivity to odors and noises. Depression or mood  changes. Painful menstrual periods (dysmenorrhea). Dry skin or eyes. These symptoms can vary over time. Myofascial pain syndrome Symptoms of myofascial pain syndrome include: Tight, ropy bands of muscle. Uncomfortable sensations in muscle areas. These may include aching, cramping, burning, numbness, tingling, and weakness. Difficulty moving certain parts of the body freely (poor range of motion). How is this diagnosed? This condition may be diagnosed by your symptoms and medical history. You will also have a physical exam. In general: Fibromyalgia is diagnosed if you have pain, fatigue, and other symptoms for more than 3 months, and symptoms cannot be explained by another condition. Myofascial pain syndrome is diagnosed if you have trigger points in your muscles, and those trigger points are tender and cause pain elsewhere in your body (referred pain). How is this treated? Treatment for these conditions depends on the type that you have. For fibromyalgia, a healthy lifestyle is the most important treatment including aerobic and strength exercises. Different types of medicines are used to help treat pain and include: NSAIDs. Medicines for treating depression. Medicines that help control seizures. Medicines that relax the muscles. Treatment for myofascial pain syndrome includes: Pain medicines, such as NSAIDs. Cooling and stretching of muscles. Massage therapy with myofascial release technique. Trigger point injections. Treating these conditions often requires a team of health care providers. These may include: Your primary care provider. A physical therapist. Complementary health care providers, such as massage therapists or acupuncturists. A psychiatrist for cognitive behavioral therapy. Follow these instructions at home: Medicines Take over-the-counter and prescription medicines only as told by your health care provider. Ask your health care provider if the medicine prescribed to  you: Requires you to avoid driving or using machinery. Can cause constipation. You may need to take these actions to prevent or treat  constipation: Drink enough fluid to keep your urine pale yellow. Take over-the-counter or prescription medicines. Eat foods that are high in fiber, such as beans, whole grains, and fresh fruits and vegetables. Limit foods that are high in fat and processed sugars, such as fried or sweet foods. Lifestyle  Do exercises as told by your health care provider or physical therapist. Practice relaxation techniques to control your stress. You may want to try: Biofeedback. Visual imagery. Hypnosis. Muscle relaxation. Yoga. Meditation. Maintain a healthy lifestyle. This includes eating a healthy diet and getting enough sleep. Do not use any products that contain nicotine or tobacco. These products include cigarettes, chewing tobacco, and vaping devices, such as e-cigarettes. If you need help quitting, ask your health care provider. General instructions Talk to your health care provider about complementary treatments, such as acupuncture or massage. Do not do activities that stress or strain your muscles. This includes repetitive motions and heavy lifting. Keep all follow-up visits. This is important. Where to find support Consider joining a support group with others who are diagnosed with this condition. National Fibromyalgia Association: fmaware.org Where to find more information [place]. Pain Foundation: uspainfoundation.org Contact a health care provider if: You have new symptoms. Your symptoms get worse or your pain is severe. You have side effects from your medicines. You have trouble sleeping. Your condition is causing depression or anxiety. Get help right away if: You have thoughts of hurting yourself or others. Get help right away if you feel like you may hurt yourself or others, or have thoughts about taking your own life. Go to your nearest emergency  room or: Call 911. Call the National Suicide Prevention Lifeline at 651-875-0214 or 988. This is open 24 hours a day. Text the Crisis Text Line at (564) 296-2548. This information is not intended to replace advice given to you by your health care provider. Make sure you discuss any questions you have with your health care provider. Document Revised: 06/14/2022 Document Reviewed: 08/07/2021 Elsevier Patient Education  2024 [employer].

## 2023-12-11 ENCOUNTER — Other Ambulatory Visit: Payer: Self-pay | Admitting: Family Medicine

## 2023-12-11 DIAGNOSIS — E559 Vitamin D deficiency, unspecified: Secondary | ICD-10-CM

## 2023-12-12 ENCOUNTER — Ambulatory Visit (HOSPITAL_COMMUNITY): Admitting: Physical Therapy

## 2023-12-12 DIAGNOSIS — M25551 Pain in right hip: Secondary | ICD-10-CM

## 2023-12-12 DIAGNOSIS — M7071 Other bursitis of hip, right hip: Secondary | ICD-10-CM

## 2023-12-12 DIAGNOSIS — G8929 Other chronic pain: Secondary | ICD-10-CM

## 2023-12-12 DIAGNOSIS — M6281 Muscle weakness (generalized): Secondary | ICD-10-CM | POA: Diagnosis not present

## 2023-12-12 NOTE — Therapy (Signed)
 OUTPATIENT PHYSICAL THERAPY LOWER EXTREMITY TREATMENT   Patient Name: Gabriella Schmidt MRN: [id number] DOB:29-Jun-1981, 43 y.o., female Today's Date: 12/12/2023  END OF SESSION:  PT End of Session - 12/12/23 1059     Visit Number 6    Number of Visits 12    Date for PT Re-Evaluation 12/20/23    Authorization Type [facility]    Authorization Time Period No auth; no visit limit    Progress Note Due on Visit 10    PT Start Time 0856    PT Stop Time 0935    PT Time Calculation (min) 39 min                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   Hypertension    Pre-diabetes    Past Surgical History:  Procedure Laterality Date   ROBOTIC ASSISTED LAPAROSCOPIC HYSTERECTOMY AND SALPINGECTOMY Bilateral 05/17/2023   Procedure: XI ROBOTIC ASSISTED LAPAROSCOPIC HYSTERECTOMY AND SALPINGECTOMY;  Surgeon: Myna Hidalgo, DO;  Location: [facility];  Service: Gynecology;  Laterality: Bilateral;   TOOTH EXTRACTION     TUBAL LIGATION N/A 06/29/2020   Procedure: POST PARTUM TUBAL LIGATION;  Surgeon: Tereso Newcomer, MD;  Location: [facility];  Service: Gynecology;  Laterality: N/A;   Patient Active Problem List   Diagnosis Date Noted   Patellofemoral pain syndrome 08/30/2023   Right foot pain 06/10/2023   Dysmenorrhea 05/17/2023   Trochanteric bursitis of both hips 03/22/2023   Encounter for gynecological examination with Papanicolaou smear of cervix 12/14/2022   Hyperglycemia 10/19/2022   High risk medication use 09/28/2022   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/2022   Bilateral sacroiliitis (HCC) 07/27/2022   CRP elevated 07/27/2022   Positive ANA (antinuclear antibody) 07/27/2022   Pain of both breasts 07/07/2022   Pregnancy  examination or test, negative result 07/07/2022   Prolonged periods 07/07/2022   Menorrhagia with regular cycle 03/31/2022   Uterine cramping 03/31/2022   Dyspareunia, female 03/17/2022   Urinary incontinence 03/17/2022   Hematuria 03/17/2022   Overweight (BMI 25.0-29.9) 01/05/2022   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   School health examination 12/10/2021   Weight gain 11/23/2021   Fatigue 11/23/2021   Nausea 11/23/2021   Breast tenderness 11/23/2021   Mass of upper outer quadrant of left breast 11/23/2021   Mass of lower inner quadrant of right breast 11/23/2021   Proteinuria 11/23/2021   Urinary frequency 11/23/2021   Pregnancy test negative 11/23/2021   Anemia 09/02/2021   COVID-19 04/17/2021   Insomnia 01/14/2021   Back pain 01/14/2021   Liver function test abnormality 10/06/2020   Hyperlipidemia 10/06/2020   Dizziness 10/06/2020   Degenerative disc disease at L5-S1 level 10/06/2020   Asthma 09/01/2020   Chronic hypertension 01/31/2020   Depression with anxiety 01/31/2020    PCP: Gilmore Laroche  REFERRING PROVIDER: Claudette Laws  REFERRING DIAG:  Diagnosis  M25.562,G89.29 (ICD-10-CM) - Chronic pain of left knee  M70.61,M70.62 (ICD-10-CM) - Trochanteric bursitis of both hips    THERAPY DIAG:  Diagnosis  M25.562,G89.29 (ICD-10-CM) - Chronic pain of left knee  M70.61,M70.62 (ICD-10-CM) - Trochanteric bursitis of both hips    Rationale for Evaluation and Treatment: Rehabilitation  ONSET  DATE: 09/22/23  SUBJECTIVE:   SUBJECTIVE STATEMENT:   Pt reports it's too early to tell how she's feeling this morning. Started cleaning out closets at her house over the weekend and sat in the floor a lot; hip was killing her.  No reports of pain currently.    PERTINENT HISTORY: See above  PAIN:  Are you having pain? 0/10  PRECAUTIONS: None  RED FLAGS: None   WEIGHT BEARING RESTRICTIONS: No  FALLS:  Has patient fallen in last 6 months? No  LIVING  ENVIRONMENT: Lives with: lives with their family Lives in: House/apartment Stairs: Yes: Internal: 0 steps; and External: 9 steps; on right going up Has following equipment at home: None  OCCUPATION: goes to [employer]   PLOF: Independent  PATIENT GOALS: less pain, be able to walk more and go up and down steps easier.   NEXT MD VISIT: not scheduled  OBJECTIVE:  Note: Objective measures were completed at Evaluation unless otherwise noted.  DIAGNOSTIC FINDINGS: IMPRESSION: Minimal medial and patellofemoral compartment osteoarthritis.     Electronically Signed   By: Neita Garnet M.D.   MUSCLE LENGTH: Hamstrings: Right 160 deg; Left 145 deg   POSTURE: No Significant postural limitations  PALPATION: Tender to palpation of both the knee and hip   LOWER EXTREMITY ROM: WFL   LOWER EXTREMITY MMT:  MMT Right eval Left eval  Hip flexion 5 3+  Hip extension 5 3  Hip abduction 4- 3+  Hip adduction    Hip internal rotation    Hip external rotation    Knee flexion 4+ 3+  Knee extension 5 3+ give way due to pain   Ankle dorsiflexion 5 3  Ankle plantarflexion    Ankle inversion    Ankle eversion     (Blank rows = not tested)  FUNCTIONAL TESTS:  30 seconds chair stand test: 7 ;  16 is poor for age and sex;  64 is average  2 minute walk test: 446 ft  SLS:  RT: 15"  ,  LT:   12"                                                                                                                                 TREATMENT DATE:  12/12/23 Nustep seat 10 level 3 [place] 5 minutes LE only Standing: lateral step ups 4" 10X bilaterally  Forward step ups 4" 10X with power up  Lunge onto 4" step 10X no UE assist  Squats 10X cues for form  Vectors 10X5" each with 1 UE assist  Hip abduction 10X each Sit to stand into heelraise and 3# cane press up 2X10  12/07/23 Standing: lateral step ups 4" 10X bilaterally  Forward step ups 4" 10X  Lunge onto 4" step 10X no UE assist  Squats 10X  in good form  Vectors 2 sets 5X5" each with 1 UE assist  Side stepping with RTB around thighs 2RT on  20 foot line  Sit to stand into heelraise with RTB around thighs 2X10  12/05/23: Rockerboard 2 Rt/Lt and Df/Pf 2' each Sit to stand then heel raise 2x 10 (RTB around thigh 2nd set) Sidestep with theraband around thigh 2RT Squat front of chair 2x 10 C/o dizziness.  Given water, sat and BP taken: 134/82 mmHg HR 89 bpm Lunge to floor with foam 3 sets x 5 reps Vector stance 3x 5" Lateral step up 2in step 15x  12/02/23 Heel raise x 15 Rocker board x 2 minutes Step up 6" B x 10  Lateral step up 2" B x 10  Squat B x 15 Lt knee flexion with 3# x 10  Single leg stance B  x 3 Lunge x 10  Side step with green thera-band  x 1 Sit to stand x 15 Lunge x 10 B onto 6" step  Slant board 2 x 30" Leg press PL3 x 15 Nu step level 3 x 6:00  11/28/23 Nustep level 3 x 5 minutes seat at 9 Rocker board x 2 minutes.   Heel raise x 10 Squat x 10  Step up x 10 B Sit to stand x 10  Quad stretch 3 x 30 B Hamstring stretch on 12 stool 3x 30"  Lunge B x 10  Leg press 3Pl x 10 ITB stretch as wall x 2 B 30"   PATIENT EDUCATION:  Education details: HEP Person educated: Patient Education method: Chief Technology [employer] Education comprehension: verbalized understanding and returned demonstration  HOME EXERCISE PROGRAM: Access Code: [email or web address] URL: [email or web address] Date: 11/08/2023 Prepared by: Virgina Organ  Exercises - Sit to Stand  - 3 x daily - 7 x weekly - 1 sets - 10 reps - Standing Single Leg Heel Raise  - 2 x daily - 7 x weekly - 1 sets - 5 reps - Seated Long Arc Quad  - 3 x daily - 7 x weekly - 1 sets - 10 reps - 5" hold  11/23/23: - Supine Bridge with Resistance Band  - 1 x daily - 7 x weekly - 3 sets - 10 reps - Clam with Resistance  - 1 x daily - 7 x weekly - 3 sets - 10 reps  Date: 12/12/2023 Prepared by: Emeline Gins - Standing Hip Abduction with Counter  Support  - 2 x daily - 7 x weekly - 2 sets - 10 reps - Forward Lunge with Counter Support  - 2 x daily - 7 x weekly - 2 sets - 10 reps - Lateral Step Up  - 2 x daily - 7 x weekly - 2 sets - 10 reps - Runner's Step up with Arms Forward  - 2 x daily - 7 x weekly - 2 sets - 10 reps - Standing 3-Way Kick  - 2 x daily - 7 x weekly - 2 sets - 10 reps  ASSESSMENT:  CLINICAL IMPRESSION:  Began session on nustep for functional warmup/activity tolerance.  Continued with LE strength and stability. Progressed difficulty of sit to stands with added heelraise and press up combo.  Pt able to complete and maintain stability. Good form achieved with all exercises with minimal cues needed.  Updated HEP to include added therex this session.  PT verbalized fatigue at end of session today. Pt will continue to benefit from skilled therapy to progress towards goals.    OBJECTIVE IMPAIRMENTS: Abnormal gait, decreased activity tolerance, decreased balance, difficulty walking, decreased strength, and pain.   ACTIVITY LIMITATIONS: carrying, lifting, bending,  standing, squatting, stairs, and locomotion level  PARTICIPATION LIMITATIONS: meal prep, cleaning, and shopping   REHAB POTENTIAL: Good  CLINICAL DECISION MAKING: Evolving/moderate complexity  EVALUATION COMPLEXITY: Moderate   GOALS: Goals reviewed with patient? Yes  SHORT TERM GOALS: Target date: 11/29/2023 PT to be I in HEP in order to reduce her pain to no greater than a 5/10 in her RT LE  Baseline: Goal status: on going  2.  Pt strength to be increased by 1/2 grade to allow pt to go up and down a flight of steps reciprocally Baseline:  Goal status: on going  3.  PT to be able to single leg stance for at least  20   seconds to decrease risk of falls.  Baseline:  Goal status: on going   LONG TERM GOALS: Target date: 12/20/2023  PT to be I in an advanced HEP in order to reduce her pain to no greater than a 3/10  Baseline:  Goal status: on  going  2.  Pt strength to be increased by 1 grade to allow pt to squat to the floor and return without UE assist  Baseline:  Goal status: on going  3.  PT to be able to single leg stance for at least  30  seconds to decrease risk of falls.  Baseline:  Goal status: on going   PLAN:  PT FREQUENCY: 2x/week  PT DURATION: 6 weeks  PLANNED INTERVENTIONS: 97110-Therapeutic exercises, 97530- Therapeutic activity, 97112- Neuromuscular re-education, 97535- Self Care, and 16109- Manual therapy  PLAN FOR NEXT SESSION:   Continue with skilled PT to progress pt functional ability  Lurena Nida, PTA/CLT [facility] Outpatient Rehabilitation [facility] Ph: [phone]   12/12/2023, 11:03 AM

## 2023-12-14 ENCOUNTER — Ambulatory Visit (HOSPITAL_COMMUNITY): Admitting: Physical Therapy

## 2023-12-14 DIAGNOSIS — M25551 Pain in right hip: Secondary | ICD-10-CM

## 2023-12-14 DIAGNOSIS — M6281 Muscle weakness (generalized): Secondary | ICD-10-CM | POA: Diagnosis not present

## 2023-12-14 DIAGNOSIS — M7071 Other bursitis of hip, right hip: Secondary | ICD-10-CM

## 2023-12-14 DIAGNOSIS — G8929 Other chronic pain: Secondary | ICD-10-CM

## 2023-12-14 NOTE — Therapy (Signed)
 OUTPATIENT PHYSICAL THERAPY LOWER EXTREMITY TREATMENT   Patient Name: Gabriella Schmidt MRN: [id number] DOB:August 22, 1981, 43 y.o., female Today's Date: 12/14/2023  END OF SESSION:  PT End of Session - 12/14/23 1152     Visit Number 7    Number of Visits 12    Date for PT Re-Evaluation 12/20/23    Authorization Type [facility]    Authorization Time Period No auth; no visit limit    Progress Note Due on Visit 10    PT Start Time 1151    PT Stop Time 1235    PT Time Calculation (min) 44 min                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   Hypertension    Pre-diabetes    Past Surgical History:  Procedure Laterality Date   ROBOTIC ASSISTED LAPAROSCOPIC HYSTERECTOMY AND SALPINGECTOMY Bilateral 05/17/2023   Procedure: XI ROBOTIC ASSISTED LAPAROSCOPIC HYSTERECTOMY AND SALPINGECTOMY;  Surgeon: Myna Hidalgo, DO;  Location: [facility];  Service: Gynecology;  Laterality: Bilateral;   TOOTH EXTRACTION     TUBAL LIGATION N/A 06/29/2020   Procedure: POST PARTUM TUBAL LIGATION;  Surgeon: Tereso Newcomer, MD;  Location: [facility];  Service: Gynecology;  Laterality: N/A;   Patient Active Problem List   Diagnosis Date Noted   Patellofemoral pain syndrome 08/30/2023   Right foot pain 06/10/2023   Dysmenorrhea 05/17/2023   Trochanteric bursitis of both hips 03/22/2023   Encounter for gynecological examination with Papanicolaou smear of cervix 12/14/2022   Hyperglycemia 10/19/2022   High risk medication use 09/28/2022   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/2022   Bilateral sacroiliitis (HCC) 07/27/2022   CRP elevated 07/27/2022   Positive ANA (antinuclear antibody) 07/27/2022   Pain of both breasts 07/07/2022   Pregnancy  examination or test, negative result 07/07/2022   Prolonged periods 07/07/2022   Menorrhagia with regular cycle 03/31/2022   Uterine cramping 03/31/2022   Dyspareunia, female 03/17/2022   Urinary incontinence 03/17/2022   Hematuria 03/17/2022   Overweight (BMI 25.0-29.9) 01/05/2022   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   School health examination 12/10/2021   Weight gain 11/23/2021   Fatigue 11/23/2021   Nausea 11/23/2021   Breast tenderness 11/23/2021   Mass of upper outer quadrant of left breast 11/23/2021   Mass of lower inner quadrant of right breast 11/23/2021   Proteinuria 11/23/2021   Urinary frequency 11/23/2021   Pregnancy test negative 11/23/2021   Anemia 09/02/2021   COVID-19 04/17/2021   Insomnia 01/14/2021   Back pain 01/14/2021   Liver function test abnormality 10/06/2020   Hyperlipidemia 10/06/2020   Dizziness 10/06/2020   Degenerative disc disease at L5-S1 level 10/06/2020   Asthma 09/01/2020   Chronic hypertension 01/31/2020   Depression with anxiety 01/31/2020    PCP: Gilmore Laroche  REFERRING PROVIDER: Claudette Laws  REFERRING DIAG:  Diagnosis  M25.562,G89.29 (ICD-10-CM) - Chronic pain of left knee  M70.61,M70.62 (ICD-10-CM) - Trochanteric bursitis of both hips    THERAPY DIAG:  Diagnosis  M25.562,G89.29 (ICD-10-CM) - Chronic pain of left knee  M70.61,M70.62 (ICD-10-CM) - Trochanteric bursitis of both hips    Rationale for Evaluation and Treatment: Rehabilitation  ONSET  DATE: 09/22/23  SUBJECTIVE:   SUBJECTIVE STATEMENT:   Pt reports she's not too sore today. No pain since last appt and when does have pain it's not like it had been before.     PERTINENT HISTORY: See above  PAIN:  Are you having pain? 0/10  PRECAUTIONS: None  RED FLAGS: None   WEIGHT BEARING RESTRICTIONS: No  FALLS:  Has patient fallen in last 6 months? No  LIVING ENVIRONMENT: Lives with: lives with their family Lives in: House/apartment Stairs: Yes:  Internal: 0 steps; and External: 9 steps; on right going up Has following equipment at home: None  OCCUPATION: goes to [employer]   PLOF: Independent  PATIENT GOALS: less pain, be able to walk more and go up and down steps easier.   NEXT MD VISIT: not scheduled  OBJECTIVE:  Note: Objective measures were completed at Evaluation unless otherwise noted.  DIAGNOSTIC FINDINGS: IMPRESSION: Minimal medial and patellofemoral compartment osteoarthritis.     Electronically Signed   By: Neita Garnet M.D.   MUSCLE LENGTH: Hamstrings: Right 160 deg; Left 145 deg   POSTURE: No Significant postural limitations  PALPATION: Tender to palpation of both the knee and hip   LOWER EXTREMITY ROM: WFL   LOWER EXTREMITY MMT:  MMT Right eval Left eval  Hip flexion 5 3+  Hip extension 5 3  Hip abduction 4- 3+  Hip adduction    Hip internal rotation    Hip external rotation    Knee flexion 4+ 3+  Knee extension 5 3+ give way due to pain   Ankle dorsiflexion 5 3  Ankle plantarflexion    Ankle inversion    Ankle eversion     (Blank rows = not tested)  FUNCTIONAL TESTS:  30 seconds chair stand test: 7 ;  16 is poor for age and sex;  66 is average  2 minute walk test: 446 ft  SLS:  RT: 15"  ,  LT:   12"                                                                                                                                 TREATMENT DATE:  12/14/23 Nustep seat 10 level 4 [place] 5 minutes LE only Standing: lateral step ups 6" 10X2 bilaterally  Forward step ups 6" 10X2 with power up  Lunge onto 4" step 10X2 no UE assist  Squats 10X2 cues for form  Vectors 10X5" each with 1 UE assist  Hip abduction 10X2 each Sit to stand into heelraise and 3# cane press up X10  12/12/23 Nustep seat 10 level 3 [place] 5 minutes LE only Standing: lateral step ups 4" 10X bilaterally  Forward step ups 4" 10X with power up  Lunge onto 4" step 10X no UE assist  Squats 10X cues for  form  Vectors 10X5" each with 1 UE assist  Hip abduction 10X each Sit to stand into heelraise and  3# cane press up 2X10  12/07/23 Standing: lateral step ups 4" 10X bilaterally  Forward step ups 4" 10X  Lunge onto 4" step 10X no UE assist  Squats 10X in good form  Vectors 2 sets 5X5" each with 1 UE assist  Side stepping with RTB around thighs 2RT on 20 foot line  Sit to stand into heelraise with RTB around thighs 2X10  12/05/23: Rockerboard 2 Rt/Lt and Df/Pf 2' each Sit to stand then heel raise 2x 10 (RTB around thigh 2nd set) Sidestep with theraband around thigh 2RT Squat front of chair 2x 10 C/o dizziness.  Given water, sat and BP taken: 134/82 mmHg HR 89 bpm Lunge to floor with foam 3 sets x 5 reps Vector stance 3x 5" Lateral step up 2in step 15x  12/02/23 Heel raise x 15 Rocker board x 2 minutes Step up 6" B x 10  Lateral step up 2" B x 10  Squat B x 15 Lt knee flexion with 3# x 10  Single leg stance B  x 3 Lunge x 10  Side step with green thera-band  x 1 Sit to stand x 15 Lunge x 10 B onto 6" step  Slant board 2 x 30" Leg press PL3 x 15 Nu step level 3 x 6:00  11/28/23 Nustep level 3 x 5 minutes seat at 9 Rocker board x 2 minutes.   Heel raise x 10 Squat x 10  Step up x 10 B Sit to stand x 10  Quad stretch 3 x 30 B Hamstring stretch on 12 stool 3x 30"  Lunge B x 10  Leg press 3Pl x 10 ITB stretch as wall x 2 B 30"   PATIENT EDUCATION:  Education details: HEP Person educated: Patient Education method: Chief Technology Officer Education comprehension: verbalized understanding and returned demonstration  HOME EXERCISE PROGRAM: Access Code: [email or web address] URL: [email or web address] Date: 11/08/2023 Prepared by: Virgina Organ  Exercises - Sit to Stand  - 3 x daily - 7 x weekly - 1 sets - 10 reps - Standing Single Leg Heel Raise  - 2 x daily - 7 x weekly - 1 sets - 5 reps - Seated Long Arc Quad  - 3 x daily - 7 x weekly - 1 sets - 10 reps - 5"  hold  11/23/23: - Supine Bridge with Resistance Band  - 1 x daily - 7 x weekly - 3 sets - 10 reps - Clam with Resistance  - 1 x daily - 7 x weekly - 3 sets - 10 reps  Date: 12/12/2023 Prepared by: Emeline Gins - Standing Hip Abduction with Counter Support  - 2 x daily - 7 x weekly - 2 sets - 10 reps - Forward Lunge with Counter Support  - 2 x daily - 7 x weekly - 2 sets - 10 reps - Lateral Step Up  - 2 x daily - 7 x weekly - 2 sets - 10 reps - Runner's Step up with Arms Forward  - 2 x daily - 7 x weekly - 2 sets - 10 reps - Standing 3-Way Kick  - 2 x daily - 7 x weekly - 2 sets - 10 reps  ASSESSMENT:  CLINICAL IMPRESSION:  Continued with LE strength and stability. Increased to 2 sets of most exercises without difficulty.  Step up also increased to 6" height.   Pt able to complete and maintain stability.  No cues needed today for form.  PT verbalized fatigue at end of session  today but not as fatigued as last session. No pain reported at any time during session. Pt will continue to benefit from skilled therapy to progress towards goals.    OBJECTIVE IMPAIRMENTS: Abnormal gait, decreased activity tolerance, decreased balance, difficulty walking, decreased strength, and pain.   ACTIVITY LIMITATIONS: carrying, lifting, bending, standing, squatting, stairs, and locomotion level  PARTICIPATION LIMITATIONS: meal prep, cleaning, and shopping   REHAB POTENTIAL: Good  CLINICAL DECISION MAKING: Evolving/moderate complexity  EVALUATION COMPLEXITY: Moderate   GOALS: Goals reviewed with patient? Yes  SHORT TERM GOALS: Target date: 11/29/2023 PT to be I in HEP in order to reduce her pain to no greater than a 5/10 in her RT LE  Baseline: Goal status: on going  2.  Pt strength to be increased by 1/2 grade to allow pt to go up and down a flight of steps reciprocally Baseline:  Goal status: on going  3.  PT to be able to single leg stance for at least  20   seconds to decrease risk of falls.   Baseline:  Goal status: on going   LONG TERM GOALS: Target date: 12/20/2023  PT to be I in an advanced HEP in order to reduce her pain to no greater than a 3/10  Baseline:  Goal status: on going  2.  Pt strength to be increased by 1 grade to allow pt to squat to the floor and return without UE assist  Baseline:  Goal status: on going  3.  PT to be able to single leg stance for at least  30  seconds to decrease risk of falls.  Baseline:  Goal status: on going   PLAN:  PT FREQUENCY: 2x/week  PT DURATION: 6 weeks  PLANNED INTERVENTIONS: 97110-Therapeutic exercises, 97530- Therapeutic activity, 97112- Neuromuscular re-education, 97535- Self Care, and 08657- Manual therapy  PLAN FOR NEXT SESSION:   Continue with skilled PT to progress pt functional ability  Lurena Nida, PTA/CLT [facility] Outpatient Rehabilitation [facility] Ph: [phone]   12/14/2023, 12:34 PM

## 2023-12-19 ENCOUNTER — Ambulatory Visit (HOSPITAL_COMMUNITY): Admitting: Physical Therapy

## 2023-12-19 DIAGNOSIS — M25551 Pain in right hip: Secondary | ICD-10-CM

## 2023-12-19 DIAGNOSIS — M7071 Other bursitis of hip, right hip: Secondary | ICD-10-CM

## 2023-12-19 DIAGNOSIS — M6281 Muscle weakness (generalized): Secondary | ICD-10-CM | POA: Diagnosis not present

## 2023-12-19 DIAGNOSIS — G8929 Other chronic pain: Secondary | ICD-10-CM

## 2023-12-19 NOTE — Therapy (Signed)
 OUTPATIENT PHYSICAL THERAPY LOWER EXTREMITY TREATMENT Progress Note Reporting Period 11/08/23 to 12/19/23  See note below for Objective Data and Assessment of Progress/Goals.      Patient Name: Gabriella Schmidt MRN: [id number] DOB:01-09-1981, 43 y.o., female Today's Date: 12/19/2023  END OF SESSION:  PT End of Session - 12/19/23 1103     Visit Number 8    Number of Visits 12    Date for PT Re-Evaluation 12/20/23    Authorization Type [facility]    Authorization Time Period No auth; no visit limit    Progress Note Due on Visit 10    PT Start Time 1103    PT Stop Time 1141    PT Time Calculation (min) 38 min                 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   Hypertension    Pre-diabetes    Past Surgical History:  Procedure Laterality Date   ROBOTIC ASSISTED LAPAROSCOPIC HYSTERECTOMY AND SALPINGECTOMY Bilateral 05/17/2023   Procedure: XI ROBOTIC ASSISTED LAPAROSCOPIC HYSTERECTOMY AND SALPINGECTOMY;  Surgeon: Myna Hidalgo, DO;  Location: [facility];  Service: Gynecology;  Laterality: Bilateral;   TOOTH EXTRACTION     TUBAL LIGATION N/A 06/29/2020   Procedure: POST PARTUM TUBAL LIGATION;  Surgeon: Tereso Newcomer, MD;  Location: [facility];  Service: Gynecology;  Laterality: N/A;   Patient Active Problem List   Diagnosis Date Noted   Patellofemoral pain syndrome 08/30/2023   Right foot pain 06/10/2023   Dysmenorrhea 05/17/2023   Trochanteric bursitis of both hips 03/22/2023   Encounter for gynecological examination with Papanicolaou smear of cervix 12/14/2022   Hyperglycemia 10/19/2022   High risk medication use 09/28/2022   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/2022   Bilateral sacroiliitis (HCC) 07/27/2022    CRP elevated 07/27/2022   Positive ANA (antinuclear antibody) 07/27/2022   Pain of both breasts 07/07/2022   Pregnancy examination or test, negative result 07/07/2022   Prolonged periods 07/07/2022   Menorrhagia with regular cycle 03/31/2022   Uterine cramping 03/31/2022   Dyspareunia, female 03/17/2022   Urinary incontinence 03/17/2022   Hematuria 03/17/2022   Overweight (BMI 25.0-29.9) 01/05/2022   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   School health examination 12/10/2021   Weight gain 11/23/2021   Fatigue 11/23/2021   Nausea 11/23/2021   Breast tenderness 11/23/2021   Mass of upper outer quadrant of left breast 11/23/2021   Mass of lower inner quadrant of right breast 11/23/2021   Proteinuria 11/23/2021   Urinary frequency 11/23/2021   Pregnancy test negative 11/23/2021   Anemia 09/02/2021   COVID-19 04/17/2021   Insomnia 01/14/2021   Back pain 01/14/2021   Liver function test abnormality 10/06/2020   Hyperlipidemia 10/06/2020   Dizziness 10/06/2020   Degenerative disc disease at L5-S1 level 10/06/2020   Asthma 09/01/2020   Chronic hypertension 01/31/2020   Depression with anxiety 01/31/2020    PCP: Gilmore Laroche  REFERRING PROVIDER: Claudette Laws  REFERRING DIAG:  Diagnosis  M25.562,G89.29 (ICD-10-CM) - Chronic pain of left knee  M70.61,M70.62 (ICD-10-CM) - Trochanteric bursitis of both hips    THERAPY DIAG:  Diagnosis  M25.562,G89.29 (ICD-10-CM) - Chronic pain of  left knee  M70.61,M70.62 (ICD-10-CM) - Trochanteric bursitis of both hips    Rationale for Evaluation and Treatment: Rehabilitation  ONSET DATE: 09/22/23  SUBJECTIVE:   SUBJECTIVE STATEMENT:   Pt states pain in Lt hip today around 8/10.  States she done some shopping on Saturday and was outside blowing bubbles with her youngest yesterday and thinks that flaired it up.       PERTINENT HISTORY: See above  PAIN:  Are you having pain? Yes, 8/10  Lt hip  PRECAUTIONS: None  RED  FLAGS: None   WEIGHT BEARING RESTRICTIONS: No  FALLS:  Has patient fallen in last 6 months? No  LIVING ENVIRONMENT: Lives with: lives with their family Lives in: House/apartment Stairs: Yes: Internal: 0 steps; and External: 9 steps; on right going up Has following equipment at home: None  OCCUPATION: goes to [employer]   PLOF: Independent  PATIENT GOALS: less pain, be able to walk more and go up and down steps easier.   NEXT MD VISIT: not scheduled  OBJECTIVE:  Note: Objective measures were completed at Evaluation unless otherwise noted.  DIAGNOSTIC FINDINGS: IMPRESSION: Minimal medial and patellofemoral compartment osteoarthritis.     Electronically Signed   By: Neita Garnet M.D.   MUSCLE LENGTH: Hamstrings: Right 160 deg; Left 145 deg   POSTURE: No Significant postural limitations  PALPATION: Tender to palpation of both the knee and hip   LOWER EXTREMITY ROM: WFL   LOWER EXTREMITY MMT:  MMT Right eval Left eval Rt 12/19/23 Lt 12/19/23  Hip flexion 5 3+ 5 4+  Hip extension 5 3 5 4   Hip abduction 4- 3+ 5 4+  Hip adduction      Hip internal rotation      Hip external rotation      Knee flexion 4+ 3+ 5 5  Knee extension 5 3+ give way due to pain  5 5  Ankle dorsiflexion 5 3 5 5   Ankle plantarflexion      Ankle inversion      Ankle eversion       (Blank rows = not tested)  FUNCTIONAL TESTS:  30 seconds chair stand test: 7 ;  16 is poor for age and sex;  94 is average  2 minute walk test: 446 ft  SLS:  RT: 15"  ,  LT:   12"                                                                                                                                 TREATMENT DATE:  12/19/23 Nustep seat 10 level 4; 5 minutes LE only Standing: Hip abduction 10X2 each GTB Functional squats 2X10 lateral step ups 6" 10X2 bilaterally  Forward step ups 6" 10X2 with power up  Wall slides 10X5" holds Functional testing: 30 seconds chair stand test: 9  ( was  7 ;  16 is  poor for age and sex;  71 is average)  SLS:  30" on both (was RT: 15",  LT:   12") MMT (see above) PATIENT EDUCATION:  Education details: HEP Person educated: Patient Education method: Chief Technology Officer Education comprehension: verbalized understanding and returned demonstration  HOME EXERCISE PROGRAM: Access Code: [email or web address] URL: [email or web address] Date: 11/08/2023 Prepared by: Virgina Organ  Exercises - Sit to Stand  - 3 x daily - 7 x weekly - 1 sets - 10 reps - Standing Single Leg Heel Raise  - 2 x daily - 7 x weekly - 1 sets - 5 reps - Seated Long Arc Quad  - 3 x daily - 7 x weekly - 1 sets - 10 reps - 5" hold  11/23/23: - Supine Bridge with Resistance Band  - 1 x daily - 7 x weekly - 3 sets - 10 reps - Clam with Resistance  - 1 x daily - 7 x weekly - 3 sets - 10 reps  Date: 12/12/2023 Prepared by: Emeline Gins - Standing Hip Abduction with Counter Support  - 2 x daily - 7 x weekly - 2 sets - 10 reps - Forward Lunge with Counter Support  - 2 x daily - 7 x weekly - 2 sets - 10 reps - Lateral Step Up  - 2 x daily - 7 x weekly - 2 sets - 10 reps - Runner's Step up with Arms Forward  - 2 x daily - 7 x weekly - 2 sets - 10 reps - Standing 3-Way Kick  - 2 x daily - 7 x weekly - 2 sets - 10 reps  ASSESSMENT:  CLINICAL IMPRESSION:  Progress note completed this session due to certification period ending tomorrow.  Pt has made great improvements overall.  No pain in Rt LE but continues to have intermittent pain in Lt hip to knee.  Pt is compliant with HEP.  Pt has met all STG's and progressing towards LTG's.  Pt has 3 visits remaining to focus in on functional strengthening and reduction of pain.  Began wall slides with full 5" count/holds to help increase quad eccentric control and functional strength.  No cues needed today for form.   Pt will continue to benefit from skilled therapy to progress towards unmet goals.    OBJECTIVE IMPAIRMENTS: Abnormal gait,  decreased activity tolerance, decreased balance, difficulty walking, decreased strength, and pain.   ACTIVITY LIMITATIONS: carrying, lifting, bending, standing, squatting, stairs, and locomotion level  PARTICIPATION LIMITATIONS: meal prep, cleaning, and shopping   REHAB POTENTIAL: Good  CLINICAL DECISION MAKING: Evolving/moderate complexity  EVALUATION COMPLEXITY: Moderate   GOALS: Goals reviewed with patient? Yes  SHORT TERM GOALS: Target date: 11/29/2023 PT to be I in HEP in order to reduce her pain to no greater than a 5/10 in her RT LE  Baseline: Goal status: MET (pain is in Lt not Rt)  2.  Pt strength to be increased by 1/2 grade to allow pt to go up and down a flight of steps reciprocally Baseline: Goal status: MET  3.  PT to be able to single leg stance for at least  20   seconds to decrease risk of falls.  Baseline:  Goal status: MET   LONG TERM GOALS: Target date: 12/20/2023  PT to be I in an advanced HEP in order to reduce her pain to no greater than a 3/10 in Lt LE Baseline:  Goal status: on going  2.  Pt strength to be increased by 1 grade to allow pt to squat to the floor and  return without UE assist  Baseline:  Goal status: on going  3.  PT to be able to single leg stance for at least  30  seconds to decrease risk of falls.  Baseline:  Goal status: MET   PLAN:  PT FREQUENCY: 2x/week  PT DURATION: 6 weeks  PLANNED INTERVENTIONS: 97110-Therapeutic exercises, 97530- Therapeutic activity, 97112- Neuromuscular re-education, 97535- Self Care, and 19147- Manual therapy  PLAN FOR NEXT SESSION:   Continue with skilled PT to progress pt functional ability  Lurena Nida, PTA/CLT [facility] Ph: [phone]   12/19/2023, 11:04 AM

## 2023-12-20 NOTE — Addendum Note (Signed)
 Addended by: Bella Kennedy on: 12/20/2023 10:41 AM   Modules accepted: Orders
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
## 2023-12-21 ENCOUNTER — Ambulatory Visit (HOSPITAL_COMMUNITY): Attending: Physical Medicine & Rehabilitation | Admitting: Physical Therapy

## 2023-12-21 DIAGNOSIS — M7072 Other bursitis of hip, left hip: Secondary | ICD-10-CM | POA: Insufficient documentation

## 2023-12-21 DIAGNOSIS — G8929 Other chronic pain: Secondary | ICD-10-CM | POA: Insufficient documentation

## 2023-12-21 DIAGNOSIS — Z79899 Other long term (current) drug therapy: Secondary | ICD-10-CM | POA: Diagnosis present

## 2023-12-21 DIAGNOSIS — M25561 Pain in right knee: Secondary | ICD-10-CM | POA: Diagnosis present

## 2023-12-21 DIAGNOSIS — M222X1 Patellofemoral disorders, right knee: Secondary | ICD-10-CM | POA: Insufficient documentation

## 2023-12-21 DIAGNOSIS — M25551 Pain in right hip: Secondary | ICD-10-CM | POA: Diagnosis present

## 2023-12-21 DIAGNOSIS — M7071 Other bursitis of hip, right hip: Secondary | ICD-10-CM | POA: Insufficient documentation

## 2023-12-21 DIAGNOSIS — M45A7 Non-radiographic axial spondyloarthritis of lumbosacral region: Secondary | ICD-10-CM | POA: Diagnosis present

## 2023-12-21 DIAGNOSIS — M6281 Muscle weakness (generalized): Secondary | ICD-10-CM | POA: Insufficient documentation

## 2023-12-21 NOTE — Therapy (Signed)
 12/21/2023, 11:40 AM  OUTPATIENT PHYSICAL THERAPY LOWER EXTREMITY TREATMENT  Patient Name: Gabriella Schmidt MRN: [id number] DOB:[birth year], 43 y.o., female Today's Date: 12/21/2023  END OF SESSION:  PT End of Session - 12/21/23 1140     Visit Number 9    Number of Visits 12    Date for PT Re-Evaluation 01/03/24    Authorization Type [facility]    Authorization Time Period No auth; no visit limit    Progress Note Due on Visit 12    PT Start Time 1100    PT Stop Time 1140    PT Time Calculation (min) 40 min                  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   Hypertension    Pre-diabetes    Past Surgical History:  Procedure Laterality Date   ROBOTIC ASSISTED LAPAROSCOPIC HYSTERECTOMY AND SALPINGECTOMY Bilateral 05/17/2023   Procedure: XI ROBOTIC ASSISTED LAPAROSCOPIC HYSTERECTOMY AND SALPINGECTOMY;  Surgeon: Myna Hidalgo, DO;  Location: [facility];  Service: Gynecology;  Laterality: Bilateral;   TOOTH EXTRACTION     TUBAL LIGATION N/A 06/29/2020   Procedure: POST PARTUM TUBAL LIGATION;  Surgeon: Tereso Newcomer, MD;  Location: [facility];  Service: Gynecology;  Laterality: N/A;   Patient Active Problem List   Diagnosis Date Noted   Patellofemoral pain syndrome 08/30/2023   Right foot pain 06/10/2023   Dysmenorrhea 05/17/2023   Trochanteric bursitis of both hips 03/22/2023   Encounter for gynecological examination with Papanicolaou smear of cervix 12/14/2022   Hyperglycemia 10/19/2022   High risk medication use 09/28/2022   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/2022   Bilateral sacroiliitis (HCC) 07/27/2022   CRP elevated 07/27/2022   Positive ANA (antinuclear antibody) 07/27/2022   Pain of both breasts  07/07/2022   Pregnancy examination or test, negative result 07/07/2022   Prolonged periods 07/07/2022   Menorrhagia with regular cycle 03/31/2022   Uterine cramping 03/31/2022   Dyspareunia, female 03/17/2022   Urinary incontinence 03/17/2022   Hematuria 03/17/2022   Overweight (BMI 25.0-29.9) 01/05/2022   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   School health examination 12/10/2021   Weight gain 11/23/2021   Fatigue 11/23/2021   Nausea 11/23/2021   Breast tenderness 11/23/2021   Mass of upper outer quadrant of left breast 11/23/2021   Mass of lower inner quadrant of right breast 11/23/2021   Proteinuria 11/23/2021   Urinary frequency 11/23/2021   Pregnancy test negative 11/23/2021   Anemia 09/02/2021   COVID-19 04/17/2021   Insomnia 01/14/2021   Back pain 01/14/2021   Liver function test abnormality 10/06/2020   Hyperlipidemia 10/06/2020   Dizziness 10/06/2020   Degenerative disc disease at L5-S1 level 10/06/2020   Asthma 09/01/2020   Chronic hypertension 01/31/2020   Depression with anxiety 01/31/2020    PCP: Gilmore Laroche  REFERRING PROVIDER: Claudette Laws  REFERRING DIAG:  Diagnosis  M25.562,G89.29 (ICD-10-CM) - Chronic pain of left knee  M70.61,M70.62 (ICD-10-CM) - Trochanteric bursitis of both hips    THERAPY DIAG:  Diagnosis  M25.562,G89.29 (ICD-10-CM) - Chronic pain of left knee  M70.61,M70.62 (ICD-10-CM) - Trochanteric bursitis of both hips    Rationale for Evaluation  and Treatment: Rehabilitation  ONSET DATE: 09/22/23  SUBJECTIVE:   SUBJECTIVE STATEMENT:   Pt states that her pain is doing okay today.         PERTINENT HISTORY: See above  PAIN:  Are you having pain? Yes, 5/10  Lt hip  PRECAUTIONS: None  RED FLAGS: None   WEIGHT BEARING RESTRICTIONS: No  FALLS:  Has patient fallen in last 6 months? No  LIVING ENVIRONMENT: Lives with: lives with their family Lives in: House/apartment Stairs: Yes: Internal: 0 steps; and External: 9  steps; on right going up Has following equipment at home: None  OCCUPATION: goes to [employer]   PLOF: Independent  PATIENT GOALS: less pain, be able to walk more and go up and down steps easier.   NEXT MD VISIT: not scheduled  OBJECTIVE:  Note: Objective measures were completed at Evaluation unless otherwise noted.  DIAGNOSTIC FINDINGS: IMPRESSION: Minimal medial and patellofemoral compartment osteoarthritis.     Electronically Signed   By: Neita Garnet M.D.   MUSCLE LENGTH: Hamstrings: Right 160 deg; Left 145 deg   POSTURE: No Significant postural limitations  PALPATION: Tender to palpation of both the knee and hip   LOWER EXTREMITY ROM: WFL   LOWER EXTREMITY MMT:  MMT Right eval Left eval Rt 12/19/23 Lt 12/19/23  Hip flexion 5 3+ 5 4+  Hip extension 5 3 5 4   Hip abduction 4- 3+ 5 4+  Hip adduction      Hip internal rotation      Hip external rotation      Knee flexion 4+ 3+ 5 5  Knee extension 5 3+ give way due to pain  5 5  Ankle dorsiflexion 5 3 5 5   Ankle plantarflexion      Ankle inversion      Ankle eversion       (Blank rows = not tested)  FUNCTIONAL TESTS:  30 seconds chair stand test: 7 ;  16 is poor for age and sex;  71 is average  2 minute walk test: 446 ft  SLS:  RT: 15"  ,  LT:   12"                                                                                                                                 TREATMENT DATE:  12/21/23:   Wall squats 10" x 5 Heel raise x 15 Sit to stand x 10 Squat x 10 Leg press 4 Pl x 15 Piriformis stretch 3 x 30" B Side lying ITB band stretch 3 x 30" B (top leg goes back and off edge of bed) Quadriped on elbows hip extension x 2 sets of 5 B  Fire hydrant 2 sets of 5 B Nustep level 4 x 7 minutes   12/19/23 Nustep seat 10 level 4; 5 minutes LE only Standing: Hip abduction 10X2 each GTB Functional squats 2X10 lateral step ups 6" 10X2 bilaterally  Forward step  ups 6" 10X2 with power up  Wall slides  10X5" holds Functional testing: 30 seconds chair stand test: 9  ( was  7 ;  16 is poor for age and sex;  38 is average)  SLS:  30" on both (was RT: 15",  LT:   12") MMT (see above) PATIENT EDUCATION:  Education details: HEP Person educated: Patient Education method: Chief Technology Officer Education comprehension: verbalized understanding and returned demonstration  HOME EXERCISE PROGRAM: Access Code: [email or web address] URL: [email or web address] Date: 11/08/2023 Prepared by: Virgina Organ  Exercises - Sit to Stand  - 3 x daily - 7 x weekly - 1 sets - 10 reps - Standing Single Leg Heel Raise  - 2 x daily - 7 x weekly - 1 sets - 5 reps - Seated Long Arc Quad  - 3 x daily - 7 x weekly - 1 sets - 10 reps - 5" hold  11/23/23: - Supine Bridge with Resistance Band  - 1 x daily - 7 x weekly - 3 sets - 10 reps - Clam with Resistance  - 1 x daily - 7 x weekly - 3 sets - 10 reps  Date: 12/12/2023 Prepared by: Emeline Gins - Standing Hip Abduction with Counter Support  - 2 x daily - 7 x weekly - 2 sets - 10 reps - Forward Lunge with Counter Support  - 2 x daily - 7 x weekly - 2 sets - 10 reps - Lateral Step Up  - 2 x daily - 7 x weekly - 2 sets - 10 reps - Runner's Step up with Arms Forward  - 2 x daily - 7 x weekly - 2 sets - 10 reps - Standing 3-Way Kick  - 2 x daily - 7 x weekly - 2 sets - 10 reps                        12/21/23                   - Supine Piriformis Stretch Pulling Heel to Hip  - 1 x daily - 7 x weekly - 1 sets - 3 reps - 30 seconds  hold          - Sidelying ITB Stretch  - 1 x daily - 7 x weekly - 1 sets - 3 reps - 30 seconds  hold          - Quadruped on Forearms Hip Extension  - 1 x daily - 7 x weekly - 1 sets - 10 reps - 5" hold ASSESSMENT:  CLINICAL IMPRESSION: Therapist updated HEP with stretches and hip extension.  Therapist gave pt Maisie Fus stretch but no tightness was noted by therapist or pt therefore dismissed this stretch.  Pt needed verbal cuing to keep  abdominals engaged with quadriped hip extension.    Pt will continue to benefit from skilled therapy to progress towards unmet goals.    OBJECTIVE IMPAIRMENTS: Abnormal gait, decreased activity tolerance, decreased balance, difficulty walking, decreased strength, and pain.   ACTIVITY LIMITATIONS: carrying, lifting, bending, standing, squatting, stairs, and locomotion level  PARTICIPATION LIMITATIONS: meal prep, cleaning, and shopping   REHAB POTENTIAL: Good  CLINICAL DECISION MAKING: Evolving/moderate complexity  EVALUATION COMPLEXITY: Moderate   GOALS: Goals reviewed with patient? Yes  SHORT TERM GOALS: Target date: 11/29/2023 PT to be I in HEP in order to reduce her pain to no greater than a 5/10 in her RT LE  Baseline: Goal status:  MET (pain is in Lt not Rt)  2.  Pt strength to be increased by 1/2 grade to allow pt to go up and down a flight of steps reciprocally Baseline: Goal status: MET  3.  PT to be able to single leg stance for at least  20   seconds to decrease risk of falls.  Baseline:  Goal status: MET   LONG TERM GOALS: Target date: 12/20/2023  PT to be I in an advanced HEP in order to reduce her pain to no greater than a 3/10 in Lt LE Baseline:  Goal status: on going  2.  Pt strength to be increased by 1 grade to allow pt to squat to the floor and return without UE assist  Baseline:  Goal status: on going  3.  PT to be able to single leg stance for at least  30  seconds to decrease risk of falls.  Baseline:  Goal status: MET   PLAN:  PT FREQUENCY: 2x/week  PT DURATION: 6 weeks  PLANNED INTERVENTIONS: 97110-Therapeutic exercises, 97530- Therapeutic activity, [id number]- Neuromuscular re-education, 97535- Self Care, and 69629- Manual therapy  PLAN FOR NEXT SESSION:   Continue with skilled PT to progress pt functional ability  Virgina Organ, PT CLT [phone]    12/21/2023, 11:40 AM

## 2023-12-25 ENCOUNTER — Other Ambulatory Visit: Payer: Self-pay | Admitting: Family Medicine

## 2023-12-25 DIAGNOSIS — E559 Vitamin D deficiency, unspecified: Secondary | ICD-10-CM

## 2023-12-25 MED ORDER — VITAMIN D (ERGOCALCIFEROL) 1.25 MG (50000 UNIT) PO CAPS: 50000.0000 [IU] | ORAL_CAPSULE | ORAL | 2 refills | Status: AC

## 2023-12-26 ENCOUNTER — Other Ambulatory Visit (HOSPITAL_COMMUNITY): Payer: Self-pay | Admitting: Adult Health

## 2023-12-26 ENCOUNTER — Inpatient Hospital Stay (HOSPITAL_COMMUNITY): Admission: RE | Admit: 2023-12-26 | Source: Ambulatory Visit

## 2023-12-26 ENCOUNTER — Encounter (HOSPITAL_COMMUNITY): Payer: Self-pay

## 2023-12-26 ENCOUNTER — Encounter (HOSPITAL_COMMUNITY): Admitting: Physical Therapy

## 2023-12-26 DIAGNOSIS — Z1231 Encounter for screening mammogram for malignant neoplasm of breast: Secondary | ICD-10-CM

## 2023-12-27 ENCOUNTER — Ambulatory Visit: Admitting: Adult Health

## 2023-12-28 ENCOUNTER — Ambulatory Visit (HOSPITAL_COMMUNITY)

## 2023-12-28 DIAGNOSIS — M7071 Other bursitis of hip, right hip: Secondary | ICD-10-CM

## 2023-12-28 DIAGNOSIS — G8929 Other chronic pain: Secondary | ICD-10-CM

## 2023-12-28 DIAGNOSIS — M25551 Pain in right hip: Secondary | ICD-10-CM

## 2023-12-28 DIAGNOSIS — M6281 Muscle weakness (generalized): Secondary | ICD-10-CM

## 2023-12-28 NOTE — Therapy (Signed)
 12/28/2023, 11:50 AM  OUTPATIENT PHYSICAL THERAPY LOWER EXTREMITY TREATMENT & DISCHARGE  PHYSICAL THERAPY DISCHARGE SUMMARY  Visits from Start of Care: 10  Current functional level related to goals / functional outcomes: Independent w/ functional mobility and maintenance with HEP performance; improved pain response to activity/exercises; able to tolerate increased activity with reduce pain reaction   Remaining deficits: Pain with excessive activity; some residual strength deficits with which HEP continued to progress for improving functional strength with independence   Education / Equipment: HEP education; use of [employer] pool/aquatics as needed   Patient agrees to discharge. Patient goals were met. Patient is being discharged due to meeting the stated rehab goals.   Patient Name: Gabriella Schmidt MRN: [id number] DOB:[birth year], 43 y.o., female Today's Date: 12/28/2023  END OF SESSION:  PT End of Session - 12/28/23 1027     Visit Number 10    Number of Visits 12    Date for PT Re-Evaluation 01/03/24    Authorization Type [facility]    PT Start Time 1020    PT Stop Time 1100    PT Time Calculation (min) 40 min    Activity Tolerance Patient tolerated treatment well    Behavior During Therapy [facility] for tasks assessed/performed                   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   Hypertension    Pre-diabetes    Past Surgical History:  Procedure Laterality Date   ROBOTIC ASSISTED LAPAROSCOPIC HYSTERECTOMY AND SALPINGECTOMY Bilateral 05/17/2023   Procedure: XI ROBOTIC ASSISTED LAPAROSCOPIC HYSTERECTOMY AND SALPINGECTOMY;  Surgeon: Myna Hidalgo, DO;  Location: [facility];  Service: Gynecology;  Laterality: Bilateral;   TOOTH EXTRACTION     TUBAL LIGATION  N/A 06/29/2020   Procedure: POST PARTUM TUBAL LIGATION;  Surgeon: Tereso Newcomer, MD;  Location: [facility];  Service: Gynecology;  Laterality: N/A;   Patient Active Problem List   Diagnosis Date Noted   Patellofemoral pain syndrome 08/30/2023   Right foot pain 06/10/2023   Dysmenorrhea 05/17/2023   Trochanteric bursitis of both hips 03/22/2023   Encounter for gynecological examination with Papanicolaou smear of cervix 12/14/2022   Hyperglycemia 10/19/2022   High risk medication use 09/28/2022   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/2022   Bilateral sacroiliitis (HCC) 07/27/2022   CRP elevated 07/27/2022   Positive ANA (antinuclear antibody) 07/27/2022   Pain of both breasts 07/07/2022   Pregnancy examination or test, negative result 07/07/2022   Prolonged periods 07/07/2022   Menorrhagia with regular cycle 03/31/2022   Uterine cramping 03/31/2022   Dyspareunia, female 03/17/2022   Urinary incontinence 03/17/2022   Hematuria 03/17/2022   Overweight (BMI 25.0-29.9) 01/05/2022   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   School health examination 12/10/2021   Weight gain 11/23/2021   Fatigue 11/23/2021   Nausea 11/23/2021   Breast tenderness 11/23/2021   Mass of upper outer quadrant of left breast 11/23/2021   Mass of lower inner quadrant of right breast 11/23/2021   Proteinuria 11/23/2021   Urinary frequency 11/23/2021   Pregnancy test negative 11/23/2021   Anemia 09/02/2021   COVID-19 04/17/2021   Insomnia 01/14/2021   Back  pain 01/14/2021   Liver function test abnormality 10/06/2020   Hyperlipidemia 10/06/2020   Dizziness 10/06/2020   Degenerative disc disease at L5-S1 level 10/06/2020   Asthma 09/01/2020   Chronic hypertension 01/31/2020   Depression with anxiety 01/31/2020    PCP: Gilmore Laroche  REFERRING PROVIDER: Claudette Laws  REFERRING DIAG:  Diagnosis  M25.562,G89.29 (ICD-10-CM) - Chronic pain of left knee   M70.61,M70.62 (ICD-10-CM) - Trochanteric bursitis of both hips    THERAPY DIAG:  Diagnosis  M25.562,G89.29 (ICD-10-CM) - Chronic pain of left knee  M70.61,M70.62 (ICD-10-CM) - Trochanteric bursitis of both hips    Rationale for Evaluation and Treatment: Rehabilitation  ONSET DATE: 09/22/23  SUBJECTIVE:   SUBJECTIVE STATEMENT:   Pt states she's enjoyed doing her exercises at home and with her husband. Ready for discharge.    PERTINENT HISTORY: See above  PAIN:  Are you having pain? Yes, 5/10  Lt hip  PRECAUTIONS: None  RED FLAGS: None   WEIGHT BEARING RESTRICTIONS: No  FALLS:  Has patient fallen in last 6 months? No  LIVING ENVIRONMENT: Lives with: lives with their family Lives in: House/apartment Stairs: Yes: Internal: 0 steps; and External: 9 steps; on right going up Has following equipment at home: None  OCCUPATION: goes to [employer]   PLOF: Independent  PATIENT GOALS: less pain, be able to walk more and go up and down steps easier.   NEXT MD VISIT: not scheduled  OBJECTIVE:  Note: Objective measures were completed at Evaluation unless otherwise noted.  DIAGNOSTIC FINDINGS: IMPRESSION: Minimal medial and patellofemoral compartment osteoarthritis.     Electronically Signed   By: Neita Garnet M.D.   MUSCLE LENGTH: Hamstrings: Right 160 deg; Left 145 deg   POSTURE: No Significant postural limitations  PALPATION: Tender to palpation of both the knee and hip   LOWER EXTREMITY ROM: [facility]   LOWER EXTREMITY MMT:  MMT Right eval Left eval Rt 12/19/23 Lt 12/19/23 Lt 12/28/23  Hip flexion 5 3+ 5 4+ 5  Hip extension 5 3 5 4  4+  Hip abduction 4- 3+ 5 4+ 5  Hip adduction       Hip internal rotation       Hip external rotation       Knee flexion 4+ 3+ 5 5 5   Knee extension 5 3+ give way due to pain  5 5 5   Ankle dorsiflexion 5 3 5 5 5   Ankle plantarflexion       Ankle inversion       Ankle eversion        (Blank rows = not tested)  FUNCTIONAL  TESTS:  30 seconds chair stand test: 7 ;  16 is poor for age and sex;  54 is average  2 minute walk test: 446 ft  SLS:  RT: 15"  ,  LT:   12"  TREATMENT DATE:  12/28/23 Wall squats w/ ball against wall  LTR, DKTC mobility interventions Balance interventions Education on progressing as tolerated Aquatic education as tolerated  12/21/23:   Wall squats 10" x 5 Heel raise x 15 Sit to stand x 10 Squat x 10 Leg press 4 Pl x 15 Piriformis stretch 3 x 30" B Side lying ITB band stretch 3 x 30" B (top leg goes back and off edge of bed) Quadriped on elbows hip extension x 2 sets of 5 B  Fire hydrant 2 sets of 5 B Nustep level 4 x 7 minutes   12/19/23 Nustep seat 10 level 4; 5 minutes LE only Standing: Hip abduction 10X2 each GTB Functional squats 2X10 lateral step ups 6" 10X2 bilaterally  Forward step ups 6" 10X2 with power up  Wall slides 10X5" holds Functional testing: 30 seconds chair stand test: 9  ( was  7 ;  16 is poor for age and sex;  85 is average)  SLS:  30" on both (was RT: 15",  LT:   12") MMT (see above) PATIENT EDUCATION:  Education details: HEP Person educated: Patient Education method: Chief Technology Officer Education comprehension: verbalized understanding and returned demonstration  HOME EXERCISE PROGRAM: Access Code: [email or web address] URL: [email or web address] Date: 11/08/2023 Prepared by: Virgina Organ  Exercises - Sit to Stand  - 3 x daily - 7 x weekly - 1 sets - 10 reps - Standing Single Leg Heel Raise  - 2 x daily - 7 x weekly - 1 sets - 5 reps - Seated Long Arc Quad  - 3 x daily - 7 x weekly - 1 sets - 10 reps - 5" hold  11/23/23: - Supine Bridge with Resistance Band  - 1 x daily - 7 x weekly - 3 sets - 10 reps - Clam with Resistance  - 1 x daily - 7 x weekly - 3 sets - 10 reps  Date: 12/12/2023 Prepared by: Emeline Gins - Standing Hip Abduction with Counter Support  - 2 x daily - 7 x weekly - 2 sets - 10 reps - Forward Lunge with Counter Support  - 2 x daily - 7 x weekly - 2 sets - 10 reps - Lateral Step Up  - 2 x daily - 7 x weekly - 2 sets - 10 reps - Runner's Step up with Arms Forward  - 2 x daily - 7 x weekly - 2 sets - 10 reps - Standing 3-Way Kick  - 2 x daily - 7 x weekly - 2 sets - 10 reps                        12/21/23                   - Supine Piriformis Stretch Pulling Heel to Hip  - 1 x daily - 7 x weekly - 1 sets - 3 reps - 30 seconds  hold          - Sidelying ITB Stretch  - 1 x daily - 7 x weekly - 1 sets - 3 reps - 30 seconds  hold          - Quadruped on Forearms Hip Extension  - 1 x daily - 7 x weekly - 1 sets - 10 reps - 5" hold ASSESSMENT:  CLINICAL IMPRESSION: Demonstrates improved functional mobility and understanding with HEP compliance. Recommend continued compliance with HEP with independence as well as functional  progression of activity as tolerated. Pt expressed understanding with HEP and mobility interventions in agree ance with discharge.     OBJECTIVE IMPAIRMENTS: Abnormal gait, decreased activity tolerance, decreased balance, difficulty walking, decreased strength, and pain.   ACTIVITY LIMITATIONS: carrying, lifting, bending, standing, squatting, stairs, and locomotion level  PARTICIPATION LIMITATIONS: meal prep, cleaning, and shopping   REHAB POTENTIAL: Good  CLINICAL DECISION MAKING: Evolving/moderate complexity  EVALUATION COMPLEXITY: Moderate   GOALS: Goals reviewed with patient? Yes  SHORT TERM GOALS: Target date: 11/29/2023 PT to be I in HEP in order to reduce her pain to no greater than a 5/10 in her RT LE  Baseline: Goal status: MET (pain is in Lt not Rt)  2.  Pt strength to be increased by 1/2 grade to allow pt to go up and down a flight of steps reciprocally Baseline: Goal status: MET  3.  PT to be able to single leg stance for at least  20    seconds to decrease risk of falls.  Baseline:  Goal status: MET   LONG TERM GOALS: Target date: 12/20/2023  PT to be I in an advanced HEP in order to reduce her pain to no greater than a 3/10 in Lt LE Baseline:  Goal status: MET  2.  Pt strength to be increased by 1 grade to allow pt to squat to the floor and return without UE assist  Baseline:  Goal status: MET  3.  PT to be able to single leg stance for at least  30  seconds to decrease risk of falls.  Baseline:  Goal status: MET   PLAN (PRIOR)  PT FREQUENCY: 2x/week  PT DURATION: 6 weeks  PLANNED INTERVENTIONS: 97110-Therapeutic exercises, 97530- Therapeutic activity, [id number]- Neuromuscular re-education, 97535- Self Care, and 16109- Manual therapy  PLAN FOR NEXT SESSION:   Discharge  Placido Sou PT, DPT [facility] [phone] office  12/28/2023, 11:50 AM

## 2023-12-30 ENCOUNTER — Ambulatory Visit (INDEPENDENT_AMBULATORY_CARE_PROVIDER_SITE_OTHER): Admitting: Adult Health

## 2023-12-30 ENCOUNTER — Encounter: Payer: Self-pay | Admitting: Adult Health

## 2023-12-30 VITALS — BP 120/76 | HR 85 | Ht 65.0 in | Wt 171.0 lb

## 2023-12-30 DIAGNOSIS — R1032 Left lower quadrant pain: Secondary | ICD-10-CM | POA: Diagnosis not present

## 2023-12-30 DIAGNOSIS — N941 Unspecified dyspareunia: Secondary | ICD-10-CM

## 2023-12-30 DIAGNOSIS — Z90710 Acquired absence of both cervix and uterus: Secondary | ICD-10-CM | POA: Insufficient documentation

## 2023-12-30 NOTE — Progress Notes (Signed)
  Subjective:     Patient ID: Gabriella Schmidt, female   DOB: 11-Jul-1981, 43 y.o.   MRN: [id number]  HPI Gabriella Schmidt is a 43 year old white female, married, sp hysterectomy in complaining of LLQ pain, on and off, first noticed in January. Does have pain with sex too.  PCP is Sharen Hones NP  Review of Systems +LLQ pain, on and off, first noticed in January + pain with sex too.   Reviewed past medical,surgical, social and family history. Reviewed medications and allergies.  Objective:   Physical Exam BP 120/76 (BP Location: Right Arm, Patient Position: Sitting, Cuff Size: Normal)   Pulse 85   Ht 5\' 5"  (1.651 m)   Wt 171 lb (77.6 kg)   LMP 09/20/2022 (Approximate)   BMI 28.46 kg/m     Skin warm and dry.Pelvic: external genitalia is normal in appearance no lesions, vagina: pink,urethra has no lesions or masses noted, cervix and uterus is absent,adnexa: no masses, has LLQ tenderness noted. Bladder is non tender and no masses felt. Fall  risk is low  Upstream - 12/30/23 1040       Pregnancy Intention Screening   Does the patient want to become pregnant in the next year? N/A    Does the patient's partner want to become pregnant in the next year? N/A    Would the patient like to discuss contraceptive options today? No      Contraception Wrap Up   Current Method Female Sterilization   hyst   End Method Female Sterilization   hyst   Contraception Counseling Provided No            Examination chaperoned by Malachy Mood LPN  Assessment:     1. LLQ pain (Primary) +LLQ pain, on and off, first noticed in January Will get pelvic US to assess ovaries, could be a cyst - US PELVIC COMPLETE WITH TRANSVAGINAL; Future  2. S/P hysterectomy Had RAL hyst 05/07/23  3. Dyspareunia in female +pain with sex    Plan:     Return in about  2 weeks for pelvic US

## 2024-01-02 ENCOUNTER — Encounter: Payer: Self-pay | Admitting: *Deleted

## 2024-01-02 ENCOUNTER — Other Ambulatory Visit: Payer: Self-pay | Admitting: Physical Medicine & Rehabilitation

## 2024-01-03 ENCOUNTER — Other Ambulatory Visit: Payer: Self-pay | Admitting: Internal Medicine

## 2024-01-03 ENCOUNTER — Encounter: Payer: Self-pay | Admitting: Internal Medicine

## 2024-01-03 ENCOUNTER — Ambulatory Visit (INDEPENDENT_AMBULATORY_CARE_PROVIDER_SITE_OTHER): Admitting: Internal Medicine

## 2024-01-03 VITALS — BP 101/68 | HR 73 | Resp 14 | Ht 65.0 in | Wt 170.0 lb

## 2024-01-03 DIAGNOSIS — Z79899 Other long term (current) drug therapy: Secondary | ICD-10-CM

## 2024-01-03 DIAGNOSIS — M222X1 Patellofemoral disorders, right knee: Secondary | ICD-10-CM

## 2024-01-03 DIAGNOSIS — M45A7 Non-radiographic axial spondyloarthritis of lumbosacral region: Secondary | ICD-10-CM

## 2024-01-03 MED ORDER — COSENTYX SENSOREADY (300 MG) 150 MG/ML ~~LOC~~ SOAJ: 300.0000 mg | SUBCUTANEOUS | 0 refills | Status: DC

## 2024-01-03 NOTE — Patient Instructions (Signed)
 I recommend checking out the [place] [facility] [place] patient-centered guide for fibromyalgia and chronic pain management: [email or web address]

## 2024-01-03 NOTE — Telephone Encounter (Signed)
 Last Fill: 09/01/2023  Labs: 08/30/2023 Sedimentation rate of 41 is increased and CRP of 20.6 is increased consistent with ongoing inflammation.Blood count and kidney function are normal.  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 Consider increasing Cosentyx to 300mg  monthly   Okay to refill Cosentyx?

## 2024-01-04 ENCOUNTER — Ambulatory Visit (HOSPITAL_COMMUNITY)
Admission: RE | Admit: 2024-01-04 | Discharge: 2024-01-04 | Disposition: A | Discharge status: Home / Self Care | Source: Ambulatory Visit | Attending: Adult Health | Admitting: Adult Health

## 2024-01-04 DIAGNOSIS — Z1231 Encounter for screening mammogram for malignant neoplasm of breast: Secondary | ICD-10-CM | POA: Diagnosis present

## 2024-01-04 LAB — CBC WITH DIFFERENTIAL/PLATELET
Absolute Lymphocytes: 2218 {cells}/uL (ref 850–3900)
Absolute Monocytes: 554 {cells}/uL (ref 200–950)
Basophils Absolute: 62 {cells}/uL (ref 0–200)
Basophils Relative: 0.8 %
Eosinophils Absolute: 162 {cells}/uL (ref 15–500)
Eosinophils Relative: 2.1 %
HCT: 35.8 % (ref 35.0–45.0)
Hemoglobin: 11.6 g/dL — ABNORMAL LOW (ref 11.7–15.5)
MCH: 27.9 pg (ref 27.0–33.0)
MCHC: 32.4 g/dL (ref 32.0–36.0)
MCV: 86.1 fL (ref 80.0–100.0)
MPV: 10.4 fL (ref 7.5–12.5)
Monocytes Relative: 7.2 %
Neutro Abs: 4705 {cells}/uL (ref 1500–7800)
Neutrophils Relative %: 61.1 %
Platelets: 334 10*3/uL (ref 140–400)
RBC: 4.16 10*6/uL (ref 3.80–5.10)
RDW: 13.5 % (ref 11.0–15.0)
Total Lymphocyte: 28.8 %
WBC: 7.7 10*3/uL (ref 3.8–10.8)

## 2024-01-04 LAB — COMPREHENSIVE METABOLIC PANEL WITH GFR
AG Ratio: 1.5 (calc) (ref 1.0–2.5)
ALT: 15 U/L (ref 6–29)
AST: 14 U/L (ref 10–30)
Albumin: 4.5 g/dL (ref 3.6–5.1)
Alkaline phosphatase (APISO): 79 U/L (ref 31–125)
BUN: 14 mg/dL (ref 7–25)
CO2: 26 mmol/L (ref 20–32)
Calcium: 8.9 mg/dL (ref 8.6–10.2)
Chloride: 106 mmol/L (ref 98–110)
Creat: 0.81 mg/dL (ref 0.50–0.99)
Globulin: 3 g/dL (ref 1.9–3.7)
Glucose, Bld: 95 mg/dL (ref 65–99)
Potassium: 4.4 mmol/L (ref 3.5–5.3)
Sodium: 140 mmol/L (ref 135–146)
Total Bilirubin: 0.5 mg/dL (ref 0.2–1.2)
Total Protein: 7.5 g/dL (ref 6.1–8.1)
eGFR: 94 mL/min/{1.73_m2} (ref >=60)

## 2024-01-04 LAB — C-REACTIVE PROTEIN: CRP: 14.5 mg/L — ABNORMAL HIGH (ref <8.0)

## 2024-01-04 LAB — SEDIMENTATION RATE: Sed Rate: 46 mm/h — ABNORMAL HIGH (ref 0–20)

## 2024-01-05 ENCOUNTER — Ambulatory Visit (HOSPITAL_COMMUNITY)

## 2024-01-10 ENCOUNTER — Other Ambulatory Visit: Payer: Self-pay | Admitting: Internal Medicine

## 2024-01-10 DIAGNOSIS — M45A7 Non-radiographic axial spondyloarthritis of lumbosacral region: Secondary | ICD-10-CM

## 2024-01-10 NOTE — Telephone Encounter (Signed)
 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

## 2024-01-12 ENCOUNTER — Ambulatory Visit (INDEPENDENT_AMBULATORY_CARE_PROVIDER_SITE_OTHER)

## 2024-01-12 DIAGNOSIS — R1032 Left lower quadrant pain: Secondary | ICD-10-CM

## 2024-01-12 NOTE — Progress Notes (Signed)
 PELVIC US  TA/TV:normal vaginal cuff,normal ovaries,left ovary appears mobile,unable to slide right ovary   Chaperone Deadra Everts

## 2024-01-13 ENCOUNTER — Encounter: Payer: Self-pay | Admitting: Physical Medicine & Rehabilitation

## 2024-01-13 ENCOUNTER — Encounter: Attending: Physical Medicine & Rehabilitation | Admitting: Physical Medicine & Rehabilitation

## 2024-01-13 VITALS — BP 111/75 | HR 71 | Ht 65.0 in | Wt 168.0 lb

## 2024-01-13 DIAGNOSIS — M797 Fibromyalgia: Secondary | ICD-10-CM | POA: Insufficient documentation

## 2024-01-13 MED ORDER — GABAPENTIN 400 MG PO CAPS: 400.0000 mg | ORAL_CAPSULE | Freq: Two times a day (BID) | ORAL | 2 refills | Status: DC

## 2024-01-13 NOTE — Progress Notes (Signed)
 Subjective:    Patient ID: Gabriella Schmidt, female    DOB: 1981-07-31, 43 y.o.   MRN: [id number]  HPI  Seen by rhumatology , for the diagnosis of axial spondylarthritis    Tylenol  #3 once a day on average working well   Hip and knee pain improved after PT, did a lot of squats, leg press.  We discussed the importance of continuing these exercises on a daily basis or at least every other day.  We also discussed importance of daily walking with a goal of 150 minutes/week.  We discussed that this could be performed as little as 5 to 10 minutes 3 times a day to start with. While patient still states her pain currently is 6 out of 10 generally it has improved and has not interfered with enjoyment of life or relations with others.  Her main pain areas are the upper back and neck area low back hips left greater than right knee and both feet.  Her pain does improve with therapy exercise medication and rest. Pain Inventory Average Pain 8 Pain Right Now 6 My pain is constant, sharp, burning, dull, and aching  In the last 24 hours, has pain interfered with the following? General activity 4 Relation with others 0 Enjoyment of life 0 What TIME of day is your pain at its worst? morning , daytime, evening, and night Sleep (in general) Fair  Pain is worse with: walking, bending, inactivity, standing, and some activites Pain improves with: rest, heat/ice, therapy/exercise, and medication Relief from Meds: 5  Family History  Problem Relation Age of Onset   Hypertension Mother    Skin cancer Mother    Polycystic ovary syndrome Mother    Hypertension Father    Cardiomyopathy Father    Lymphoma Father    Obesity Brother    Obesity Brother    Obesity Brother    Hypertension Maternal Grandmother    Hypertension Maternal Grandfather    Healthy Daughter    Social History   Socioeconomic History   Marital status: Married    Spouse name: Gabriella Schmidt   Number of children: 1   Years of  education: Not on file   Highest education level: Some college, no degree  Occupational History   Not on file  Tobacco Use   Smoking status: Former    Current packs/day: 0.00    Average packs/day: 0.5 packs/day for 13.0 years (6.5 ttl pk-yrs)    Types: Cigarettes    Start date: 01/2000    Quit date: 01/2013    Years since quitting: 10.9    Passive exposure: Past   Smokeless tobacco: Never  Vaping Use   Vaping status: Former   Quit date: 09/20/2018  Substance and Sexual Activity   Alcohol use: Not Currently   Drug use: Never   Sexual activity: Yes    Birth control/protection: Surgical    Comment: Hysterectomy  Other Topics Concern   Not on file  Social History Narrative   Not on file   Social Drivers of Health   Financial Resource Strain: Low Risk  (10/20/2023)   Overall Financial Resource Strain (CARDIA)    Difficulty of Paying Living Expenses: Not hard at all  Food Insecurity: No Food Insecurity (10/20/2023)   Hunger Vital Sign    Worried About Running Out of Food in the Last Year: Never true    Ran Out of Food in the Last Year: Never true  Transportation Needs: No Transportation Needs (10/20/2023)   PRAPARE -  Administrator, Civil Service (Medical): No    Lack of Transportation (Non-Medical): No  Physical Activity: Insufficiently Active (10/20/2023)   Exercise Vital Sign    Days of Exercise per Week: 3 days    Minutes of Exercise per Session: 20 min  Stress: No Stress Concern Present (10/20/2023)   [employer] of Occupational Health - Occupational Stress Questionnaire    Feeling of Stress : Only a little  Social Connections: Socially Integrated (10/20/2023)   Social Connection and Isolation Panel [NHANES]    Frequency of Communication with Friends and Family: More than three times a week    Frequency of Social Gatherings with Friends and Family: More than three times a week    Attends Religious Services: More than 4 times per year    Active Member of  Clubs or Organizations: Yes    Attends Banker Meetings: 1 to 4 times per year    Marital Status: Married   Past Surgical History:  Procedure Laterality Date   ROBOTIC ASSISTED LAPAROSCOPIC HYSTERECTOMY AND SALPINGECTOMY Bilateral 05/17/2023   Procedure: XI ROBOTIC ASSISTED LAPAROSCOPIC HYSTERECTOMY AND SALPINGECTOMY;  Surgeon: Keene Pastures, DO;  Location: [facility];  Service: Gynecology;  Laterality: Bilateral;   TOOTH EXTRACTION     TUBAL LIGATION N/A 06/29/2020   Procedure: POST PARTUM TUBAL LIGATION;  Surgeon: Julianne Octave, MD;  Location: [facility];  Service: Gynecology;  Laterality: N/A;   Past Surgical History:  Procedure Laterality Date   ROBOTIC ASSISTED LAPAROSCOPIC HYSTERECTOMY AND SALPINGECTOMY Bilateral 05/17/2023   Procedure: XI ROBOTIC ASSISTED LAPAROSCOPIC HYSTERECTOMY AND SALPINGECTOMY;  Surgeon: Keene Pastures, DO;  Location: [facility];  Service: Gynecology;  Laterality: Bilateral;   TOOTH EXTRACTION     TUBAL LIGATION N/A 06/29/2020   Procedure: POST PARTUM TUBAL LIGATION;  Surgeon: Julianne Octave, MD;  Location: [facility];  Service: Gynecology;  Laterality: N/A;   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   Hypertension    Pre-diabetes    BP 111/75   Pulse 71   Ht 5\' 5"  (1.651 m)   Wt 168 lb (76.2 kg)   LMP 09/20/2022 (Approximate)   SpO2 97%   BMI 27.96 kg/m   Opioid Risk Score:   Fall Risk Score:  `1  Depression screen [facility] 2/9     12/09/2023   11:37 AM 10/25/2023    2:50 PM 06/21/2023   12:20 PM 06/07/2023    2:31 PM 03/22/2023    9:40 AM 02/22/2023   12:59 PM 01/18/2023    1:14 PM  Depression screen PHQ 2/9  Decreased Interest 0 0 0 0 1 0 1  Down, Depressed, Hopeless 0 0 0 0 1 0 0  PHQ - 2 Score 0 0 0 0 2 0 1  Altered  sleeping       2  Tired, decreased energy       3  Change in appetite       0  Feeling bad or failure about yourself        0  Trouble concentrating       0  Moving slowly or fidgety/restless       0  Suicidal thoughts  0  PHQ-9 Score       6  Difficult doing work/chores       Not difficult at all      Review of Systems  Musculoskeletal:  Positive for back pain and gait problem.       Right shoulder pain B/L knee pain   All other systems reviewed and are negative.      Objective:   Physical Exam  Tender points Bilateral Upper trap Left hip Left knee, over the vastus medialis muscle belly RIght elbow   Motor strength is 5/5 bilateral deltoid bicep tricep grip hip flexor knee extensor ankle dorsiflexor and plantar flexor negative straight leg raising bilaterally Hands and wrist without evidence of deformity.  No tenderness to palpation no evidence of synovitis.  Knees show no evidence of effusion no joint line tenderness to palpation no patellar tenderness.        Assessment & Plan:  #1.  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  According to the patient rheumatology expects that it may take a total of 6 months prior to full effect.  Would like to re eval pt in 4 more months Cont Diclofenac  50mg  qhs to help with nocturnal pain Cont T#4 one per day  No need for spine injection at this time  2.  Fibromyalgia syndrome which occurs frequently with inflammatory arthritis.  She is on Zoloft  and without weaning her off we could not start Cymbalta. She is on gabapentin  which has been used in some trials for fibromyalgia syndrome usually at higher doses.  I think it is reasonable to increase this to 400 mg twice daily. I will see her back in 6 months or sooner if need be.  In addition she is taking trazodone  50 nightly Have reiterated goals of 150 minutes/week of aerobic exercise such as walking Continue home exercise program for  PT

## 2024-01-13 NOTE — Patient Instructions (Signed)
 Start walking per day

## 2024-01-16 ENCOUNTER — Encounter: Payer: Self-pay | Admitting: Family Medicine

## 2024-01-16 ENCOUNTER — Ambulatory Visit (INDEPENDENT_AMBULATORY_CARE_PROVIDER_SITE_OTHER): Admitting: Family Medicine

## 2024-01-16 VITALS — BP 106/70 | HR 65 | Resp 16 | Ht 65.0 in | Wt 170.0 lb

## 2024-01-16 DIAGNOSIS — N3000 Acute cystitis without hematuria: Secondary | ICD-10-CM | POA: Diagnosis not present

## 2024-01-16 DIAGNOSIS — R1032 Left lower quadrant pain: Secondary | ICD-10-CM | POA: Diagnosis not present

## 2024-01-16 NOTE — Patient Instructions (Addendum)
 I appreciate the opportunity to provide care to you today!   Left-Sided Abdominal Pain Please stop by [facility]  for an abdominal and pelvic ultrasound or CT scan to evaluate for gastrointestinal, renal, or musculoskeletal causes. Pending Urinalysis (to rule out urinary tract infection or kidney stones)  Symptom Management: Recommend acetaminophen  for pain management if needed, avoiding NSAIDs until gastrointestinal causes are ruled out. Recommend adequate hydration and small, frequent meals if associated with digestive discomfort.  Monitoring and Follow-Up: Monitor for red flag symptoms, including: Severe worsening pain Fever Blood in urine or stool Unintentional weight loss Persistent vomiting Patient instructed to seek urgent care if any of these symptoms occur.  Avoid gas-producing foods (eg,beans, onions, celery, carrots, raisins, bananas, apricots, prunes, wheat germ, pretzels, bagels,  cabbage, legumes, onions, broccoli, brussel sprouts, wheat, and potatoes)    Referrals today-  GI    Please continue to a heart-healthy diet and increase your physical activities. Try to exercise for at least five days a week.    It was a pleasure to see you and I look forward to continuing to work together on your health and well-being. Please do not hesitate to call the office if you need care or have questions about your care.  In case of emergency, please visit the Emergency Department for urgent care, or contact our clinic at [phone] to schedule an appointment. We're here to help you!   Have a wonderful day and week. With Gratitude, Vinessa Macconnell MSN, FNP-BC

## 2024-01-16 NOTE — Assessment & Plan Note (Signed)
 Pending labs from the last visit  The patient has chronic conditions present since January 2025.  She reports intermittent pain lasting approximately one minute, describing it as similar to menstrual cramps, which improves with breathing techniques.  She denies fever, nausea, vomiting, or blood in the stool.  The patient has a history of hysterectomy with removal of the uterus and cervix, and her ovaries were noted to be stable on a recent transvaginal ultrasound. A urinalysis was negative for UTI. Imaging studies of the abdomen will be obtained to rule out abdominal pathology, and a referral to Gastroenterology will be placed for collaborative care.  The patient is advised to use acetaminophen  for pain management if needed, avoiding NSAIDs until gastrointestinal causes are ruled out.  She is encouraged to maintain adequate hydration and eat small, frequent meals if digestive discomfort is present.  She was educated on monitoring for red flag symptoms, including severe worsening pain, fever, blood in urine or stool, unintentional weight loss, and persistent vomiting, and was instructed to seek urgent care if any of these symptoms occur.  Additionally, she was advised to avoid gas-producing foods such as beans, onions, celery, carrots, raisins, bananas, apricots, prunes, wheat germ, pretzels, bagels, cabbage, legumes, broccoli, Brussels sprouts, wheat, and potatoes.

## 2024-01-16 NOTE — Assessment & Plan Note (Deleted)
 Pending labs from the last visit  The patient has chronic conditions present since January 2025.  She reports intermittent pain lasting approximately one minute, describing it as similar to menstrual cramps, which improves with breathing techniques.  She denies fever, nausea, vomiting, or blood in the stool.  The patient has a history of hysterectomy with removal of the uterus and cervix, and her ovaries were noted to be stable on a recent transvaginal ultrasound. A urinalysis was negative for UTI. Imaging studies of the abdomen will be obtained to rule out abdominal pathology, and a referral to Gastroenterology will be placed for collaborative care.  The patient is advised to use acetaminophen  for pain management if needed, avoiding NSAIDs until gastrointestinal causes are ruled out.  She is encouraged to maintain adequate hydration and eat small, frequent meals if digestive discomfort is present.  She was educated on monitoring for red flag symptoms, including severe worsening pain, fever, blood in urine or stool, unintentional weight loss, and persistent vomiting, and was instructed to seek urgent care if any of these symptoms occur.  Additionally, she was advised to avoid gas-producing foods such as beans, onions, celery, carrots, raisins, bananas, apricots, prunes, wheat germ, pretzels, bagels, cabbage, legumes, broccoli, Brussels sprouts, wheat, and potatoes.

## 2024-01-16 NOTE — Progress Notes (Signed)
 Established Patient Office Visit  Subjective:  Patient ID: Gabriella Schmidt, female    DOB: 30-Jan-1981  Age: 43 y.o. MRN: [id number]  CC:  Chief Complaint  Patient presents with   Abdominal Pain    Has been having left sided abdominal pain. Saw her OBGYN and they said its not related to her ovaries and to see her pcp. States its been going on since January.     HPI Gabriella Schmidt is a 43 y.o. female with presents with complains of left lower quadrant abdominal pain. For the details of today's visit, please refer to the assessment and plan.   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   Hypertension    Pre-diabetes     Past Surgical History:  Procedure Laterality Date   ROBOTIC ASSISTED LAPAROSCOPIC HYSTERECTOMY AND SALPINGECTOMY Bilateral 05/17/2023   Procedure: XI ROBOTIC ASSISTED LAPAROSCOPIC HYSTERECTOMY AND SALPINGECTOMY;  Surgeon: Keene Pastures, DO;  Location: [facility];  Service: Gynecology;  Laterality: Bilateral;   TOOTH EXTRACTION     TUBAL LIGATION N/A 06/29/2020   Procedure: POST PARTUM TUBAL LIGATION;  Surgeon: Julianne Octave, MD;  Location: [facility];  Service: Gynecology;  Laterality: N/A;    Family History  Problem Relation Age of Onset   Hypertension Mother    Skin cancer Mother    Polycystic ovary syndrome Mother    Hypertension Father    Cardiomyopathy Father    Lymphoma Father    Obesity Brother    Obesity Brother    Obesity Brother    Hypertension Maternal Grandmother    Hypertension Maternal Grandfather    Healthy Daughter     Social History   Socioeconomic History   Marital status: Married    Spouse name: Staisha Vicary   Number of children: 1   Years of education: Not on file   Highest education level: Some college, no degree   Occupational History   Not on file  Tobacco Use   Smoking status: Former    Current packs/day: 0.00    Average packs/day: 0.5 packs/day for 13.0 years (6.5 ttl pk-yrs)    Types: Cigarettes    Start date: 01/2000    Quit date: 01/2013    Years since quitting: 11.0    Passive exposure: Past   Smokeless tobacco: Never  Vaping Use   Vaping status: Former   Quit date: 09/20/2018  Substance and Sexual Activity   Alcohol use: Not Currently   Drug use: Never   Sexual activity: Yes    Birth control/protection: Surgical    Comment: Hysterectomy  Other Topics Concern   Not on file  Social History Narrative   Not on file   Social Drivers of Health   Financial Resource Strain: Low Risk  (10/20/2023)   Overall Financial Resource Strain (CARDIA)    Difficulty of Paying Living Expenses: Not hard at all  Food Insecurity: No Food Insecurity (10/20/2023)   Hunger Vital Sign    Worried About Running Out of Food in the Last Year: Never true    Ran Out of Food in the Last Year: Never true  Transportation Needs: No Transportation Needs (10/20/2023)   PRAPARE - Transportation    Lack  of Transportation (Medical): No    Lack of Transportation (Non-Medical): No  Physical Activity: Insufficiently Active (10/20/2023)   Exercise Vital Sign    Days of Exercise per Week: 3 days    Minutes of Exercise per Session: 20 min  Stress: No Stress Concern Present (10/20/2023)   [employer] of Occupational Health - Occupational Stress Questionnaire    Feeling of Stress : Only a little  Social Connections: Socially Integrated (10/20/2023)   Social Connection and Isolation Panel [NHANES]    Frequency of Communication with Friends and Family: More than three times a week    Frequency of Social Gatherings with Friends and Family: More than three times a week    Attends Religious Services: More than 4 times per year    Active Member of [employer] or Organizations: Yes    Attends Banker Meetings: 1 to 4  times per year    Marital Status: Married  Catering manager Violence: Not At Risk (12/14/2022)   Humiliation, Afraid, Rape, and Kick questionnaire    Fear of Current or Ex-Partner: No    Emotionally Abused: No    Physically Abused: No    Sexually Abused: No    Outpatient Medications Prior to Visit  Medication Sig Dispense Refill   acetaminophen -codeine  (TYLENOL  #4) 300-60 MG tablet Take 1 tablet by mouth every 8 (eight) hours as needed for moderate pain (pain score 4-6). 30 tablet 1   acetaminophen -codeine  (TYLENOL  #4) 300-60 MG tablet Take 1 tablet by mouth every 4 (four) hours as needed for moderate pain (pain score 4-6). 30 tablet 2   albuterol  (VENTOLIN  HFA) 108 (90 Base) MCG/ACT inhaler Inhale 2 puffs into the lungs every 6 (six) hours as needed for wheezing or shortness of breath. 8 g 2   diclofenac  (VOLTAREN ) 50 MG EC tablet TAKE 1 TO 2 TABLETS BY MOUTH DAILY AS NEEDED 60 tablet 1   diphenhydrAMINE  (BENADRYL ) 25 MG tablet Take 50 mg by mouth at bedtime as needed for allergies or sleep.     gabapentin  (NEURONTIN ) 400 MG capsule Take 1 capsule (400 mg total) by mouth 2 (two) times daily. 60 capsule 2   Multiple Vitamin (MULTIVITAMIN) tablet Take 1 tablet by mouth daily.     pyridOXINE (VITAMIN B6) 100 MG tablet Take 100 mg by mouth daily.     Secukinumab , 300 MG Dose, (COSENTYX  SENSOREADY, 300 MG,) 150 MG/ML SOAJ Inject 2 mLs (300 mg total) into the skin every 28 (twenty-eight) days. **dose increase** 6 mL 0   sertraline  (ZOLOFT ) 100 MG tablet TAKE 2 TABLETS(200 MG) BY MOUTH DAILY 180 tablet 1   traZODone  (DESYREL ) 150 MG tablet TAKE 1 TABLET(150 MG) BY MOUTH AT BEDTIME AS NEEDED FOR SLEEP 90 tablet 0   traZODone  (DESYREL ) 50 MG tablet Take 1 tablet (50 mg total) by mouth at bedtime. 90 tablet 0   Vitamin D , Ergocalciferol , (DRISDOL ) 1.25 MG (50000 UNIT) CAPS capsule Take 1 capsule (50,000 Units total) by mouth every 7 (seven) days. 27 capsule 2   No facility-administered medications  prior to visit.    Allergies  Allergen Reactions   Latex Rash    ROS Review of Systems  Constitutional:  Negative for chills and fever.  Eyes:  Negative for visual disturbance.  Respiratory:  Negative for chest tightness and shortness of breath.   Gastrointestinal:  Positive for abdominal pain. Negative for blood in stool, constipation, diarrhea, nausea, rectal pain and vomiting.  Neurological:  Negative for dizziness and headaches.  Objective:    Physical Exam Abdominal:     General: Abdomen is flat.     Palpations: There is no hepatomegaly or mass.     Tenderness: There is abdominal tenderness in the left lower quadrant.     Hernia: There is no hernia in the umbilical area.     BP 106/70   Pulse 65   Resp 16   Ht 5\' 5"  (1.651 m)   Wt 170 lb (77.1 kg)   LMP 09/20/2022 (Approximate)   SpO2 95%   BMI 28.29 kg/m  Wt Readings from Last 3 Encounters:  01/16/24 170 lb (77.1 kg)  01/13/24 168 lb (76.2 kg)  01/03/24 170 lb (77.1 kg)    Lab Results  Component Value Date   TSH 1.150 06/03/2023   Lab Results  Component Value Date   WBC 7.7 01/03/2024   HGB 11.6 (L) 01/03/2024   HCT 35.8 01/03/2024   MCV 86.1 01/03/2024   PLT 334 01/03/2024   Lab Results  Component Value Date   NA 140 01/03/2024   K 4.4 01/03/2024   CO2 26 01/03/2024   GLUCOSE 95 01/03/2024   BUN 14 01/03/2024   CREATININE 0.81 01/03/2024   BILITOT 0.5 01/03/2024   ALKPHOS 92 06/03/2023   AST 14 01/03/2024   ALT 15 01/03/2024   PROT 7.5 01/03/2024   ALBUMIN 4.1 06/03/2023   CALCIUM 8.9 01/03/2024   ANIONGAP 10 05/13/2023   EGFR 94 01/03/2024   Lab Results  Component Value Date   CHOL 188 06/03/2023   Lab Results  Component Value Date   HDL 51 06/03/2023   Lab Results  Component Value Date   LDLCALC 116 (H) 06/03/2023   Lab Results  Component Value Date   TRIG 116 06/03/2023   Lab Results  Component Value Date   CHOLHDL 3.7 06/03/2023   Lab Results  Component  Value Date   HGBA1C 5.7 (H) 06/03/2023      Assessment & Plan:  Left lower quadrant abdominal pain Assessment & Plan: Pending labs from the last visit  The patient has chronic conditions present since January 2025.  She reports intermittent pain lasting approximately one minute, describing it as similar to menstrual cramps, which improves with breathing techniques.  She denies fever, nausea, vomiting, or blood in the stool.  The patient has a history of hysterectomy with removal of the uterus and cervix, and her ovaries were noted to be stable on a recent transvaginal ultrasound. A urinalysis was negative for UTI. Imaging studies of the abdomen will be obtained to rule out abdominal pathology, and a referral to Gastroenterology will be placed for collaborative care.  The patient is advised to use acetaminophen  for pain management if needed, avoiding NSAIDs until gastrointestinal causes are ruled out.  She is encouraged to maintain adequate hydration and eat small, frequent meals if digestive discomfort is present.  She was educated on monitoring for red flag symptoms, including severe worsening pain, fever, blood in urine or stool, unintentional weight loss, and persistent vomiting, and was instructed to seek urgent care if any of these symptoms occur.  Additionally, she was advised to avoid gas-producing foods such as beans, onions, celery, carrots, raisins, bananas, apricots, prunes, wheat germ, pretzels, bagels, cabbage, legumes, broccoli, Brussels sprouts, wheat, and potatoes.  Orders: -     CT ABDOMEN PELVIS WO CONTRAST -     Ambulatory referral to Gastroenterology -     Urinalysis -     Urine Culture  Acute  cystitis without hematuria -     Urine Culture  Note: This chart has been completed using Engineer, civil (consulting) software, and while attempts have been made to ensure accuracy, certain words and phrases may not be transcribed as intended.    Follow-up: No follow-ups on file.    Malin Sambrano, FNP

## 2024-01-17 LAB — URINALYSIS
Bilirubin, UA: NEGATIVE
Glucose, UA: NEGATIVE
Leukocytes,UA: NEGATIVE
Nitrite, UA: NEGATIVE
Protein,UA: NEGATIVE
RBC, UA: NEGATIVE
Specific Gravity, UA: >1.03 — ABNORMAL HIGH (ref 1.005–1.030)
Urobilinogen, Ur: 0.2 mg/dL (ref 0.2–1.0)
pH, UA: 6 (ref 5.0–7.5)

## 2024-01-18 LAB — URINE CULTURE

## 2024-01-19 ENCOUNTER — Encounter: Payer: Self-pay | Admitting: *Deleted

## 2024-01-26 ENCOUNTER — Other Ambulatory Visit: Payer: Self-pay | Admitting: Family Medicine

## 2024-01-26 DIAGNOSIS — G4700 Insomnia, unspecified: Secondary | ICD-10-CM

## 2024-02-01 ENCOUNTER — Ambulatory Visit (HOSPITAL_COMMUNITY)

## 2024-02-02 ENCOUNTER — Ambulatory Visit (HOSPITAL_COMMUNITY)
Admission: RE | Admit: 2024-02-02 | Discharge: 2024-02-02 | Disposition: A | Discharge status: Home / Self Care | Source: Ambulatory Visit | Attending: Family Medicine | Admitting: Family Medicine

## 2024-02-02 DIAGNOSIS — R1032 Left lower quadrant pain: Secondary | ICD-10-CM | POA: Insufficient documentation

## 2024-02-08 ENCOUNTER — Ambulatory Visit (INDEPENDENT_AMBULATORY_CARE_PROVIDER_SITE_OTHER): Admitting: Internal Medicine

## 2024-02-08 ENCOUNTER — Other Ambulatory Visit: Payer: Self-pay | Admitting: *Deleted

## 2024-02-08 ENCOUNTER — Encounter: Payer: Self-pay | Admitting: Internal Medicine

## 2024-02-08 ENCOUNTER — Encounter: Payer: Self-pay | Admitting: *Deleted

## 2024-02-08 VITALS — BP 114/71 | HR 81 | Temp 97.5°F | Ht 65.0 in | Wt 173.1 lb

## 2024-02-08 DIAGNOSIS — R1032 Left lower quadrant pain: Secondary | ICD-10-CM | POA: Diagnosis not present

## 2024-02-08 DIAGNOSIS — R140 Abdominal distension (gaseous): Secondary | ICD-10-CM | POA: Diagnosis not present

## 2024-02-08 DIAGNOSIS — R7982 Elevated C-reactive protein (CRP): Secondary | ICD-10-CM

## 2024-02-08 MED ORDER — DICYCLOMINE HCL 10 MG PO CAPS: 10.0000 mg | ORAL_CAPSULE | Freq: Four times a day (QID) | ORAL | 11 refills | Status: AC

## 2024-02-08 MED ORDER — PEG 3350-KCL-NA BICARB-NACL 420 G PO SOLR: 4000.0000 mL | Freq: Once | ORAL | 0 refills | Status: AC

## 2024-02-08 NOTE — Patient Instructions (Addendum)
 We will schedule you for colonoscopy to further evaluate your symptoms.  I am going to send in dicyclomine to take up to 4 times daily for your abdominal pain.  I am going to print you off a low FODMAP diet which may give you some insight on certain foods to avoid that have been attributed to increased bloating.  It was very nice meeting you today.  Dr. Mordechai April

## 2024-02-08 NOTE — Progress Notes (Signed)
 Primary Care Physician:  Zarwolo, Gloria, FNP Primary Gastroenterologist:  Dr. Mordechai April  Chief Complaint  Patient presents with   New Patient (Initial Visit)    Patient here today due to LLQ pain ongoing since January 2025. Had Ct abd/pelvis done on 02/02/2024.Patient reports gas pains are severe and  make her double over in pain. Denies any issues with constipation.    HPI:   Gabriella Schmidt is a 43 y.o. female who presents to clinic today by referral from her PCP Gloria Zarwolo for evaluation.  Patient states for approximately 5 months she has had chronic left lower quadrant abdominal pain.  Always there, dull achy type pain.  1-2 times a day this will turn into severe debilitating pain that last approximately 45 seconds and then goes away.  She describes this as a "gas bubble."  No melena hematochezia.  States she has a normal bowel movement once a day, [place] chart 2/3.  Does note associated abdominal bloating as well.  States she is bloated all day long.  Reports she had a colonoscopy when she was a teenager which she believes was WNL.  No family history of colorectal malignancy.  No family history of inflammatory bowel disease.  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  She had a pelvic ultrasound which was unremarkable, status post hysterectomy.  Underwent CT abdomen pelvis without contrast which I personally reviewed which was also unremarkable.    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   Hypertension    Pre-diabetes     Past Surgical History:  Procedure Laterality Date   ROBOTIC ASSISTED LAPAROSCOPIC HYSTERECTOMY AND SALPINGECTOMY Bilateral 05/17/2023    Procedure: XI ROBOTIC ASSISTED LAPAROSCOPIC HYSTERECTOMY AND SALPINGECTOMY;  Surgeon: Keene Pastures, DO;  Location: [facility];  Service: Gynecology;  Laterality: Bilateral;   TOOTH EXTRACTION     TUBAL LIGATION N/A 06/29/2020   Procedure: POST PARTUM TUBAL LIGATION;  Surgeon: Julianne Octave, MD;  Location: [facility];  Service: Gynecology;  Laterality: N/A;    Current Outpatient Medications  Medication Sig Dispense Refill   acetaminophen -codeine  (TYLENOL  #4) 300-60 MG tablet Take 1 tablet by mouth every 4 (four) hours as needed for moderate pain (pain score 4-6). 30 tablet 2   albuterol  (VENTOLIN  HFA) 108 (90 Base) MCG/ACT inhaler Inhale 2 puffs into the lungs every 6 (six) hours as needed for wheezing or shortness of breath. 8 g 2   diclofenac  (VOLTAREN ) 50 MG EC tablet TAKE 1 TO 2 TABLETS BY MOUTH DAILY AS NEEDED 60 tablet 1   diphenhydrAMINE  (BENADRYL ) 25 MG tablet Take 50 mg by mouth at bedtime as needed for allergies or sleep.     gabapentin  (NEURONTIN ) 400 MG capsule Take 1 capsule (400 mg total) by mouth 2 (two) times daily. 60 capsule 2   Multiple Vitamin (MULTIVITAMIN) tablet Take 1 tablet by mouth daily.     Secukinumab , 300 MG Dose, (COSENTYX  SENSOREADY, 300 MG,) 150 MG/ML SOAJ Inject 2 mLs (300 mg total) into the skin every 28 (twenty-eight) days. **dose increase** 6 mL 0   sertraline  (ZOLOFT ) 100 MG tablet TAKE 2 TABLETS(200 MG) BY MOUTH  DAILY 180 tablet 1   traZODone  (DESYREL ) 150 MG tablet TAKE 1 TABLET(150 MG) BY MOUTH AT BEDTIME AS NEEDED FOR SLEEP 90 tablet 0   traZODone  (DESYREL ) 50 MG tablet TAKE 1 TABLET(50 MG) BY MOUTH AT BEDTIME 90 tablet 0   Vitamin D , Ergocalciferol , (DRISDOL ) 1.25 MG (50000 UNIT) CAPS capsule Take 1 capsule (50,000 Units total) by mouth every 7 (seven) days. 27 capsule 2   pyridOXINE (VITAMIN B6) 100 MG tablet Take 100 mg by mouth daily. (Patient not taking: Reported on 02/08/2024)     No current facility-administered medications for this visit.     Allergies as of 02/08/2024 - Review Complete 02/08/2024  Allergen Reaction Noted   Latex Rash 01/31/2020    Family History  Problem Relation Age of Onset   Hypertension Mother    Skin cancer Mother    Polycystic ovary syndrome Mother    Hypertension Father    Cardiomyopathy Father    Lymphoma Father    Obesity Brother    Obesity Brother    Obesity Brother    Hypertension Maternal Grandmother    Hypertension Maternal Grandfather    Healthy Daughter     Social History   Socioeconomic History   Marital status: Married    Spouse name: Phyllis Abelson   Number of children: 1   Years of education: Not on file   Highest education level: Some college, no degree  Occupational History   Not on file  Tobacco Use   Smoking status: Former    Current packs/day: 0.00    Average packs/day: 0.5 packs/day for 13.0 years (6.5 ttl pk-yrs)    Types: Cigarettes    Start date: 01/2000    Quit date: 01/2013    Years since quitting: 11.0    Passive exposure: Past   Smokeless tobacco: Never  Vaping Use   Vaping status: Former   Quit date: 09/20/2018  Substance and Sexual Activity   Alcohol use: Not Currently   Drug use: Never   Sexual activity: Yes    Birth control/protection: Surgical    Comment: Hysterectomy  Other Topics Concern   Not on file  Social History Narrative   Not on file   Social Drivers of Health   Financial Resource Strain: Low Risk  (10/20/2023)   Overall Financial Resource Strain (CARDIA)    Difficulty of Paying Living Expenses: Not hard at all  Food Insecurity: No Food Insecurity (10/20/2023)   Hunger Vital Sign    Worried About Running Out of Food in the Last Year: Never true    Ran Out of Food in the Last Year: Never true  Transportation Needs: No Transportation Needs (10/20/2023)   PRAPARE - Administrator, Civil Service (Medical): No    Lack of Transportation (Non-Medical): No  Physical Activity: Insufficiently Active (10/20/2023)   Exercise  Vital Sign    Days of Exercise per Week: 3 days    Minutes of Exercise per Session: 20 min  Stress: No Stress Concern Present (10/20/2023)   [employer] of Occupational Health - Occupational Stress Questionnaire    Feeling of Stress : Only a little  Social Connections: Socially Integrated (10/20/2023)   Social Connection and Isolation Panel [NHANES]    Frequency of Communication with Friends and Family: More than three times a week    Frequency of Social Gatherings with Friends and Family: More than three times a week    Attends Religious Services: More than 4 times per year  Active Member of Clubs or Organizations: Yes    Attends Banker Meetings: 1 to 4 times per year    Marital Status: Married  Catering manager Violence: Not At Risk (12/14/2022)   Humiliation, Afraid, Rape, and Kick questionnaire    Fear of Current or Ex-Partner: No    Emotionally Abused: No    Physically Abused: No    Sexually Abused: No    Subjective: Review of Systems  Constitutional:  Negative for chills and fever.  HENT:  Negative for congestion and hearing loss.   Eyes:  Negative for blurred vision and double vision.  Respiratory:  Negative for cough and shortness of breath.   Cardiovascular:  Negative for chest pain and palpitations.  Gastrointestinal:  Positive for abdominal pain. Negative for blood in stool, constipation, diarrhea, heartburn, melena and vomiting.  Genitourinary:  Negative for dysuria and urgency.  Musculoskeletal:  Negative for joint pain and myalgias.  Skin:  Negative for itching and rash.  Neurological:  Negative for dizziness and headaches.  Psychiatric/Behavioral:  Negative for depression. The patient is not nervous/anxious.        Objective: BP 114/71 (BP Location: Left Arm, Patient Position: Sitting, Cuff Size: Normal)   Pulse 81   Temp (!) 97.5 F (36.4 C) (Temporal)   Ht 5\' 5"  (1.651 m)   Wt 173 lb 1.6 oz (78.5 kg)   LMP 09/20/2022 (Approximate)    BMI 28.81 kg/m  Physical Exam Constitutional:      Appearance: Normal appearance.  HENT:     Head: Normocephalic and atraumatic.  Eyes:     Extraocular Movements: Extraocular movements intact.     Conjunctiva/sclera: Conjunctivae normal.  Cardiovascular:     Rate and Rhythm: Normal rate and regular rhythm.  Pulmonary:     Effort: Pulmonary effort is normal.     Breath sounds: Normal breath sounds.  Abdominal:     General: Bowel sounds are normal.     Palpations: Abdomen is soft.     Tenderness: There is abdominal tenderness.  Musculoskeletal:        General: No swelling. Normal range of motion.     Cervical back: Normal range of motion and neck supple.  Skin:    General: Skin is warm and dry.     Coloration: Skin is not jaundiced.  Neurological:     General: No focal deficit present.     Mental Status: She is alert and oriented to person, place, and time.  Psychiatric:        Mood and Affect: Mood normal.        Behavior: Behavior normal.      Assessment/Plan:  1.  Left lower quadrant abdominal pain-etiology unclear.  Pelvic ultrasound reassuring.  CT abdomen pelvis unremarkable although was noncontrasted study.  Given chronicity and severity of symptoms, will schedule for colonoscopy to further evaluate.  Also has chronically elevated ESR/CRP likely related to her rheumatological disease though need to rule out inflammatory bowel disease.  The risks including infection, bleed, or perforation as well as benefits, limitations, alternatives and imponderables have been reviewed with the patient. Questions have been answered. All parties agreeable.  Will start on dicyclomine.  Counseled on side effect profile and patient is agreeable.    2.  Abdominal bloating-colonoscopy as above.  Will print off low FODMAP diet.  Thank you Gloria Zarwolo for the kind referral.  02/08/2024 9:35 AM   Disclaimer: This note was dictated with voice recognition software. Similar sounding  words can  inadvertently be transcribed and may not be corrected upon review.

## 2024-02-08 NOTE — H&P (View-Only) (Signed)
 Primary Care Physician:  Zarwolo, Gloria, FNP Primary Gastroenterologist:  Dr. Mordechai April  Chief Complaint  Patient presents with   New Patient (Initial Visit)    Patient here today due to LLQ pain ongoing since January 2025. Had Ct abd/pelvis done on 02/02/2024.Patient reports gas pains are severe and  make her double over in pain. Denies any issues with constipation.    HPI:   Gabriella Schmidt is a 43 y.o. female who presents to clinic today by referral from her PCP Gloria Zarwolo for evaluation.  Patient states for approximately 5 months she has had chronic left lower quadrant abdominal pain.  Always there, dull achy type pain.  1-2 times a day this will turn into severe debilitating pain that last approximately 45 seconds and then goes away.  She describes this as a "gas bubble."  No melena hematochezia.  States she has a normal bowel movement once a day, [place] chart 2/3.  Does note associated abdominal bloating as well.  States she is bloated all day long.  Reports she had a colonoscopy when she was a teenager which she believes was WNL.  No family history of colorectal malignancy.  No family history of inflammatory bowel disease.  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  She had a pelvic ultrasound which was unremarkable, status post hysterectomy.  Underwent CT abdomen pelvis without contrast which I personally reviewed which was also unremarkable.    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   Hypertension    Pre-diabetes     Past Surgical History:  Procedure Laterality Date   ROBOTIC ASSISTED LAPAROSCOPIC HYSTERECTOMY AND SALPINGECTOMY Bilateral 05/17/2023    Procedure: XI ROBOTIC ASSISTED LAPAROSCOPIC HYSTERECTOMY AND SALPINGECTOMY;  Surgeon: Keene Pastures, DO;  Location: [facility];  Service: Gynecology;  Laterality: Bilateral;   TOOTH EXTRACTION     TUBAL LIGATION N/A 06/29/2020   Procedure: POST PARTUM TUBAL LIGATION;  Surgeon: Julianne Octave, MD;  Location: [facility];  Service: Gynecology;  Laterality: N/A;    Current Outpatient Medications  Medication Sig Dispense Refill   acetaminophen -codeine  (TYLENOL  #4) 300-60 MG tablet Take 1 tablet by mouth every 4 (four) hours as needed for moderate pain (pain score 4-6). 30 tablet 2   albuterol  (VENTOLIN  HFA) 108 (90 Base) MCG/ACT inhaler Inhale 2 puffs into the lungs every 6 (six) hours as needed for wheezing or shortness of breath. 8 g 2   diclofenac  (VOLTAREN ) 50 MG EC tablet TAKE 1 TO 2 TABLETS BY MOUTH DAILY AS NEEDED 60 tablet 1   diphenhydrAMINE  (BENADRYL ) 25 MG tablet Take 50 mg by mouth at bedtime as needed for allergies or sleep.     gabapentin  (NEURONTIN ) 400 MG capsule Take 1 capsule (400 mg total) by mouth 2 (two) times daily. 60 capsule 2   Multiple Vitamin (MULTIVITAMIN) tablet Take 1 tablet by mouth daily.     Secukinumab , 300 MG Dose, (COSENTYX  SENSOREADY, 300 MG,) 150 MG/ML SOAJ Inject 2 mLs (300 mg total) into the skin every 28 (twenty-eight) days. **dose increase** 6 mL 0   sertraline  (ZOLOFT ) 100 MG tablet TAKE 2 TABLETS(200 MG) BY MOUTH  DAILY 180 tablet 1   traZODone  (DESYREL ) 150 MG tablet TAKE 1 TABLET(150 MG) BY MOUTH AT BEDTIME AS NEEDED FOR SLEEP 90 tablet 0   traZODone  (DESYREL ) 50 MG tablet TAKE 1 TABLET(50 MG) BY MOUTH AT BEDTIME 90 tablet 0   Vitamin D , Ergocalciferol , (DRISDOL ) 1.25 MG (50000 UNIT) CAPS capsule Take 1 capsule (50,000 Units total) by mouth every 7 (seven) days. 27 capsule 2   pyridOXINE (VITAMIN B6) 100 MG tablet Take 100 mg by mouth daily. (Patient not taking: Reported on 02/08/2024)     No current facility-administered medications for this visit.     Allergies as of 02/08/2024 - Review Complete 02/08/2024  Allergen Reaction Noted   Latex Rash 01/31/2020    Family History  Problem Relation Age of Onset   Hypertension Mother    Skin cancer Mother    Polycystic ovary syndrome Mother    Hypertension Father    Cardiomyopathy Father    Lymphoma Father    Obesity Brother    Obesity Brother    Obesity Brother    Hypertension Maternal Grandmother    Hypertension Maternal Grandfather    Healthy Daughter     Social History   Socioeconomic History   Marital status: Married    Spouse name: Phyllis Abelson   Number of children: 1   Years of education: Not on file   Highest education level: Some college, no degree  Occupational History   Not on file  Tobacco Use   Smoking status: Former    Current packs/day: 0.00    Average packs/day: 0.5 packs/day for 13.0 years (6.5 ttl pk-yrs)    Types: Cigarettes    Start date: 01/2000    Quit date: 01/2013    Years since quitting: 11.0    Passive exposure: Past   Smokeless tobacco: Never  Vaping Use   Vaping status: Former   Quit date: 09/20/2018  Substance and Sexual Activity   Alcohol use: Not Currently   Drug use: Never   Sexual activity: Yes    Birth control/protection: Surgical    Comment: Hysterectomy  Other Topics Concern   Not on file  Social History Narrative   Not on file   Social Drivers of Health   Financial Resource Strain: Low Risk  (10/20/2023)   Overall Financial Resource Strain (CARDIA)    Difficulty of Paying Living Expenses: Not hard at all  Food Insecurity: No Food Insecurity (10/20/2023)   Hunger Vital Sign    Worried About Running Out of Food in the Last Year: Never true    Ran Out of Food in the Last Year: Never true  Transportation Needs: No Transportation Needs (10/20/2023)   PRAPARE - Administrator, Civil Service (Medical): No    Lack of Transportation (Non-Medical): No  Physical Activity: Insufficiently Active (10/20/2023)   Exercise  Vital Sign    Days of Exercise per Week: 3 days    Minutes of Exercise per Session: 20 min  Stress: No Stress Concern Present (10/20/2023)   [employer] of Occupational Health - Occupational Stress Questionnaire    Feeling of Stress : Only a little  Social Connections: Socially Integrated (10/20/2023)   Social Connection and Isolation Panel [NHANES]    Frequency of Communication with Friends and Family: More than three times a week    Frequency of Social Gatherings with Friends and Family: More than three times a week    Attends Religious Services: More than 4 times per year  Active Member of Clubs or Organizations: Yes    Attends Banker Meetings: 1 to 4 times per year    Marital Status: Married  Catering manager Violence: Not At Risk (12/14/2022)   Humiliation, Afraid, Rape, and Kick questionnaire    Fear of Current or Ex-Partner: No    Emotionally Abused: No    Physically Abused: No    Sexually Abused: No    Subjective: Review of Systems  Constitutional:  Negative for chills and fever.  HENT:  Negative for congestion and hearing loss.   Eyes:  Negative for blurred vision and double vision.  Respiratory:  Negative for cough and shortness of breath.   Cardiovascular:  Negative for chest pain and palpitations.  Gastrointestinal:  Positive for abdominal pain. Negative for blood in stool, constipation, diarrhea, heartburn, melena and vomiting.  Genitourinary:  Negative for dysuria and urgency.  Musculoskeletal:  Negative for joint pain and myalgias.  Skin:  Negative for itching and rash.  Neurological:  Negative for dizziness and headaches.  Psychiatric/Behavioral:  Negative for depression. The patient is not nervous/anxious.        Objective: BP 114/71 (BP Location: Left Arm, Patient Position: Sitting, Cuff Size: Normal)   Pulse 81   Temp (!) 97.5 F (36.4 C) (Temporal)   Ht 5\' 5"  (1.651 m)   Wt 173 lb 1.6 oz (78.5 kg)   LMP 09/20/2022 (Approximate)    BMI 28.81 kg/m  Physical Exam Constitutional:      Appearance: Normal appearance.  HENT:     Head: Normocephalic and atraumatic.  Eyes:     Extraocular Movements: Extraocular movements intact.     Conjunctiva/sclera: Conjunctivae normal.  Cardiovascular:     Rate and Rhythm: Normal rate and regular rhythm.  Pulmonary:     Effort: Pulmonary effort is normal.     Breath sounds: Normal breath sounds.  Abdominal:     General: Bowel sounds are normal.     Palpations: Abdomen is soft.     Tenderness: There is abdominal tenderness.  Musculoskeletal:        General: No swelling. Normal range of motion.     Cervical back: Normal range of motion and neck supple.  Skin:    General: Skin is warm and dry.     Coloration: Skin is not jaundiced.  Neurological:     General: No focal deficit present.     Mental Status: She is alert and oriented to person, place, and time.  Psychiatric:        Mood and Affect: Mood normal.        Behavior: Behavior normal.      Assessment/Plan:  1.  Left lower quadrant abdominal pain-etiology unclear.  Pelvic ultrasound reassuring.  CT abdomen pelvis unremarkable although was noncontrasted study.  Given chronicity and severity of symptoms, will schedule for colonoscopy to further evaluate.  Also has chronically elevated ESR/CRP likely related to her rheumatological disease though need to rule out inflammatory bowel disease.  The risks including infection, bleed, or perforation as well as benefits, limitations, alternatives and imponderables have been reviewed with the patient. Questions have been answered. All parties agreeable.  Will start on dicyclomine.  Counseled on side effect profile and patient is agreeable.    2.  Abdominal bloating-colonoscopy as above.  Will print off low FODMAP diet.  Thank you Gloria Zarwolo for the kind referral.  02/08/2024 9:35 AM   Disclaimer: This note was dictated with voice recognition software. Similar sounding  words can  inadvertently be transcribed and may not be corrected upon review.

## 2024-02-10 ENCOUNTER — Encounter: Payer: Self-pay | Admitting: Family Medicine

## 2024-02-10 ENCOUNTER — Ambulatory Visit: Payer: Self-pay | Admitting: Family Medicine

## 2024-02-13 ENCOUNTER — Other Ambulatory Visit: Payer: Self-pay | Admitting: Family Medicine

## 2024-02-13 DIAGNOSIS — G4700 Insomnia, unspecified: Secondary | ICD-10-CM

## 2024-02-16 ENCOUNTER — Other Ambulatory Visit: Payer: Self-pay | Admitting: Physical Medicine & Rehabilitation

## 2024-02-16 ENCOUNTER — Telehealth: Payer: Self-pay | Admitting: Registered Nurse

## 2024-02-16 MED ORDER — ACETAMINOPHEN-CODEINE 300-60 MG PO TABS: 1.0000 | ORAL_TABLET | ORAL | 2 refills | Status: DC | PRN

## 2024-02-16 NOTE — Telephone Encounter (Signed)
 PMP was Reviewed.  Dr Sharl Davies note was reviewed.  T&C # 4 e-scribed to pharmacy.

## 2024-02-17 ENCOUNTER — Encounter: Payer: Self-pay | Admitting: Internal Medicine

## 2024-02-19 DIAGNOSIS — K589 Irritable bowel syndrome without diarrhea: Secondary | ICD-10-CM

## 2024-02-19 HISTORY — DX: Irritable bowel syndrome, unspecified: K58.9

## 2024-02-21 ENCOUNTER — Other Ambulatory Visit: Payer: Self-pay | Admitting: Medical Genetics

## 2024-02-23 ENCOUNTER — Ambulatory Visit: Admitting: Family Medicine

## 2024-02-28 ENCOUNTER — Ambulatory Visit (HOSPITAL_COMMUNITY)
Admission: RE | Admit: 2024-02-28 | Discharge: 2024-02-28 | Disposition: A | Discharge status: Home / Self Care | Source: Ambulatory Visit | Attending: Internal Medicine | Admitting: Internal Medicine

## 2024-02-28 ENCOUNTER — Ambulatory Visit (HOSPITAL_COMMUNITY): Admitting: Anesthesiology

## 2024-02-28 ENCOUNTER — Other Ambulatory Visit: Payer: Self-pay

## 2024-02-28 ENCOUNTER — Telehealth (INDEPENDENT_AMBULATORY_CARE_PROVIDER_SITE_OTHER): Payer: Self-pay

## 2024-02-28 ENCOUNTER — Encounter (HOSPITAL_COMMUNITY)
Admission: RE | Disposition: A | Discharge status: Home / Self Care | Payer: Self-pay | Source: Ambulatory Visit | Attending: Internal Medicine

## 2024-02-28 ENCOUNTER — Encounter (HOSPITAL_COMMUNITY): Payer: Self-pay | Admitting: Internal Medicine

## 2024-02-28 DIAGNOSIS — F419 Anxiety disorder, unspecified: Secondary | ICD-10-CM | POA: Diagnosis not present

## 2024-02-28 DIAGNOSIS — Z87891 Personal history of nicotine dependence: Secondary | ICD-10-CM | POA: Insufficient documentation

## 2024-02-28 DIAGNOSIS — Z79899 Other long term (current) drug therapy: Secondary | ICD-10-CM | POA: Diagnosis not present

## 2024-02-28 DIAGNOSIS — R7982 Elevated C-reactive protein (CRP): Secondary | ICD-10-CM | POA: Insufficient documentation

## 2024-02-28 DIAGNOSIS — M45A7 Non-radiographic axial spondyloarthritis of lumbosacral region: Secondary | ICD-10-CM | POA: Diagnosis not present

## 2024-02-28 DIAGNOSIS — R1032 Left lower quadrant pain: Secondary | ICD-10-CM

## 2024-02-28 DIAGNOSIS — F32A Depression, unspecified: Secondary | ICD-10-CM | POA: Insufficient documentation

## 2024-02-28 DIAGNOSIS — Z90710 Acquired absence of both cervix and uterus: Secondary | ICD-10-CM | POA: Diagnosis not present

## 2024-02-28 DIAGNOSIS — R700 Elevated erythrocyte sedimentation rate: Secondary | ICD-10-CM | POA: Diagnosis not present

## 2024-02-28 DIAGNOSIS — I10 Essential (primary) hypertension: Secondary | ICD-10-CM | POA: Insufficient documentation

## 2024-02-28 DIAGNOSIS — R140 Abdominal distension (gaseous): Secondary | ICD-10-CM | POA: Insufficient documentation

## 2024-02-28 HISTORY — PX: COLONOSCOPY: SHX5424

## 2024-02-28 SURGERY — COLONOSCOPY
Anesthesia: General

## 2024-02-28 MED ORDER — PROPOFOL 500 MG/50ML IV EMUL
INTRAVENOUS | Status: DC | PRN
  Administered 2024-02-28: 100 mg via INTRAVENOUS
  Administered 2024-02-28: 150 ug/kg/min via INTRAVENOUS

## 2024-02-28 MED ORDER — LACTATED RINGERS IV SOLN: INTRAVENOUS | Status: DC

## 2024-02-28 NOTE — Telephone Encounter (Signed)
#  16 at the front desk for her to pick up at [facility] location.

## 2024-02-28 NOTE — Telephone Encounter (Signed)
 Per Dr. Mordechai April: Can you do me a huge favor and leave samples of Linzess 145 mcg daily at the front desk for this patient? not sure if she will pick up today or tomorrow. She just woke up from colonoscopy so prob tomorrow.

## 2024-02-28 NOTE — Anesthesia Preprocedure Evaluation (Addendum)
 Anesthesia Evaluation  Patient identified by MRN, date of birth, ID band Patient awake    Reviewed: Allergy & Precautions, H&P , NPO status , Patient's Chart, lab work & pertinent test results, reviewed documented beta blocker date and time   Airway Mallampati: II  TM Distance: >3 FB Neck ROM: full    Dental no notable dental hx. (+) Dental Advisory Given, Teeth Intact   Pulmonary asthma , former smoker   Pulmonary exam normal breath sounds clear to auscultation       Cardiovascular Exercise Tolerance: Good hypertension, Normal cardiovascular exam Rhythm:regular Rate:Normal     Neuro/Psych  PSYCHIATRIC DISORDERS Anxiety Depression    negative neurological ROS  negative psych ROS   GI/Hepatic negative GI ROS, Neg liver ROS,,,  Endo/Other  Pre diabetes  Renal/GU negative Renal ROS  negative genitourinary   Musculoskeletal  (+) Arthritis , Osteoarthritis,    Abdominal   Peds  Hematology   Anesthesia Other Findings   Reproductive/Obstetrics negative OB ROS                             Anesthesia Physical Anesthesia Plan  ASA: 2  Anesthesia Plan: General   Post-op Pain Management: Minimal or no pain anticipated   Induction:   PONV Risk Score and Plan: Propofol  infusion  Airway Management Planned: Nasal Cannula and Natural Airway  Additional Equipment: None  Intra-op Plan:   Post-operative Plan:   Informed Consent: I have reviewed the patients History and Physical, chart, labs and discussed the procedure including the risks, benefits and alternatives for the proposed anesthesia with the patient or authorized representative who has indicated his/her understanding and acceptance.     Dental Advisory Given  Plan Discussed with: CRNA  Anesthesia Plan Comments:        Anesthesia Quick Evaluation

## 2024-02-28 NOTE — Telephone Encounter (Signed)
 Per Dr. Mordechai April patient aware to pick up at the [facility] location.

## 2024-02-28 NOTE — Interval H&P Note (Signed)
 History and Physical Interval Note:  02/28/2024 12:21 PM  Gabriella Schmidt  has presented today for surgery, with the diagnosis of LLQ pain, bloating.  The various methods of treatment have been discussed with the patient and family. After consideration of risks, benefits and other options for treatment, the patient has consented to  Procedure(s) with comments: COLONOSCOPY (N/A) - 130pm, asa 2 as a surgical intervention.  The patient's history has been reviewed, patient examined, no change in status, stable for surgery.  I have reviewed the patient's chart and labs.  Questions were answered to the patient's satisfaction.     Vinetta Greening

## 2024-02-28 NOTE — Transfer of Care (Signed)
 Immediate Anesthesia Transfer of Care Note  Patient: Gabriella Schmidt  Procedure(s) Performed: COLONOSCOPY  Patient Location: Endoscopy Unit  Anesthesia Type:General  Level of Consciousness: drowsy  Airway & Oxygen Therapy: Patient Spontanous Breathing  Post-op Assessment: Report given to RN and Post -op Vital signs reviewed and stable  Post vital signs: Reviewed and stable  Last Vitals:  Vitals Value Taken Time  BP 89/58 02/28/24 1306  Temp 36.5 C 02/28/24 1303  Pulse 82 02/28/24 1306  Resp 17 02/28/24 1306  SpO2 94 % 02/28/24 1306    Last Pain:  Vitals:   02/28/24 1303  TempSrc: Oral  PainSc:       Patients Stated Pain Goal: 6 (02/28/24 1234)  Complications: No notable events documented.

## 2024-02-28 NOTE — Anesthesia Postprocedure Evaluation (Signed)
 Anesthesia Post Note  Patient: Gabriella Schmidt  Procedure(s) Performed: COLONOSCOPY  Patient location during evaluation: Endoscopy Anesthesia Type: General Level of consciousness: awake and alert Pain management: pain level controlled Vital Signs Assessment: post-procedure vital signs reviewed and stable Respiratory status: spontaneous breathing, nonlabored ventilation and respiratory function stable Cardiovascular status: blood pressure returned to baseline and stable Postop Assessment: no apparent nausea or vomiting Anesthetic complications: no   There were no known notable events for this encounter.   Last Vitals:  Vitals:   02/28/24 1306 02/28/24 1308  BP: (!) 89/58 (!) 99/54  Pulse: 82   Resp: 17   Temp:    SpO2: 94%     Last Pain:  Vitals:   02/28/24 1303  TempSrc: Oral  PainSc:                  Sandy Crumb

## 2024-02-28 NOTE — Op Note (Signed)
 [facility] Patient Name: Gabriella Schmidt Procedure Date: 02/28/2024 12:17 PM MRN: [id number] Date of Birth: [birth year] Attending MD: Rolando Cliche. Mordechai April , [place], [id number] CSN: [id number] Age: 43 Admit Type: Outpatient Procedure:                Colonoscopy Indications:              Abdominal pain in the left lower quadrant, bloating Providers:                Rolando Cliche. Mordechai April, DO, Ashley Goins, Tammy Vaught,                            RN, Annell Barrow Referring MD:              Medicines:                See the Anesthesia note for documentation of the                            administered medications Complications:            No immediate complications. Estimated Blood Loss:     Estimated blood loss: none. Procedure:                Pre-Anesthesia Assessment:                           - The anesthesia plan was to use monitored                            anesthesia care (MAC).                           After obtaining informed consent, the colonoscope                            was passed under direct vision. Throughout the                            procedure, the patient's blood pressure, pulse, and                            oxygen saturations were monitored continuously. The                            [id number]-HQ190L ([id number]) scope was introduced through                            the anus and advanced to the the terminal ileum,                            with identification of the appendiceal orifice and                            IC valve. The colonoscopy was performed without                            difficulty.  The patient tolerated the procedure                            well. The quality of the bowel preparation was                            evaluated using the BBPS [facility] Bowel Preparation                            Scale) with scores of: Right Colon = 1 (portion of                            mucosa seen, but other areas not well seen due to                             staining, residual stool and/or opaque liquid),                            Transverse Colon = 1 (portion of mucosa seen, but                            other areas not well seen due to staining, residual                            stool and/or opaque liquid) and Left Colon = 1                            (portion of mucosa seen, but other areas not well                            seen due to staining, residual stool and/or opaque                            liquid). The total BBPS score equals 3. The quality                            of the bowel preparation was inadequate. Scope In: 12:46:03 PM Scope Out: 1:01:28 PM Scope Withdrawal Time: 0 hours 9 minutes 44 seconds  Total Procedure Duration: 0 hours 15 minutes 25 seconds  Findings:      The terminal ileum appeared normal.      Liquid stool was found in the entire colon, precluding visualization.       Lavage of the area was performed using copious amounts of sterile water,       resulting in incomplete clearance with fair visualization.      The exam was otherwise without abnormality. Impression:               - Preparation of the colon was inadequate.                           - The examined portion of the ileum was normal.                           -  Stool in the entire examined colon.                           - The examination was otherwise normal.                           - No specimens collected. Moderate Sedation:      Per Anesthesia Care Recommendation:           - Patient has a contact number available for                            emergencies. The signs and symptoms of potential                            delayed complications were discussed with the                            patient. Return to normal activities tomorrow.                            Written discharge instructions were provided to the                            patient.                           - Resume previous diet.                           - Continue  present medications.                           - Return to GI clinic in 6 weeks.                           - Prep not adequate for detection of small                            lesions/polyps, though for today's evaluation of                            abdominal pain will likely suffice. Needs screening                            colonoscopy at age 43 with extended bowel prep.                           - Bowel prep does raise the question of component                            of IBS C as potential cause of patient's symptoms                            as she reports she completed entire bowel prep.  Consider trial of Linzess. Procedure Code(s):        --- Professional ---                           [phone], Colonoscopy, flexible; diagnostic, including                            collection of specimen(s) by brushing or washing,                            when performed (separate procedure) Diagnosis Code(s):        --- Professional ---                           R10.32, Left lower quadrant pain CPT copyright 2022 [employer]. All rights reserved. The codes documented in this report are preliminary and upon coder review may  be revised to meet current compliance requirements. Rolando Cliche. Mordechai April, DO Rolando Cliche. Mordechai April, DO 02/28/2024 1:08:04 PM This report has been signed electronically. Number of Addenda: 0

## 2024-02-28 NOTE — Discharge Instructions (Addendum)
  Colonoscopy Discharge Instructions  Read the instructions outlined below and refer to this sheet in the next few weeks. These discharge instructions provide you with general information on caring for yourself after you leave the hospital. Your doctor may also give you specific instructions. While your treatment has been planned according to the most current medical practices available, unavoidable complications occasionally occur.   ACTIVITY You may resume your regular activity, but move at a slower pace for the next 24 hours.  Take frequent rest periods for the next 24 hours.  Walking will help get rid of the air and reduce the bloated feeling in your belly (abdomen).  No driving for 24 hours (because of the medicine (anesthesia) used during the test).   Do not sign any important legal documents or operate any machinery for 24 hours (because of the anesthesia used during the test).  NUTRITION Drink plenty of fluids.  You may resume your normal diet as instructed by your doctor.  Begin with a light meal and progress to your normal diet. Heavy or fried foods are harder to digest and may make you feel sick to your stomach (nauseated).  Avoid alcoholic beverages for 24 hours or as instructed.  MEDICATIONS You may resume your normal medications unless your doctor tells you otherwise.  WHAT YOU CAN EXPECT TODAY Some feelings of bloating in the abdomen.  Passage of more gas than usual.  Spotting of blood in your stool or on the toilet paper.  IF YOU HAD POLYPS REMOVED DURING THE COLONOSCOPY: No aspirin  products for 7 days or as instructed.  No alcohol for 7 days or as instructed.  Eat a soft diet for the next 24 hours.  FINDING OUT THE RESULTS OF YOUR TEST Not all test results are available during your visit. If your test results are not back during the visit, make an appointment with your caregiver to find out the results. Do not assume everything is normal if you have not heard from your  caregiver or the medical facility. It is important for you to follow up on all of your test results.  SEEK IMMEDIATE MEDICAL ATTENTION IF: You have more than a spotting of blood in your stool.  Your belly is swollen (abdominal distention).  You are nauseated or vomiting.  You have a temperature over 101.  You have abdominal pain or discomfort that is severe or gets worse throughout the day.   Overall, your colon appeared very healthy.  I did not see any active inflammation indicative of underlying inflammatory bowel disease such as Crohn's disease or ulcerative colitis throughout your colon or end portion of your small bowel.    Your colon was not entirely cleaned out. I would recommend repeat colonoscopy at age 44 for screening purposes.  Follow up in GI office in 6 weeks. Message sent to office  Pick up Linzess samples in office  I hope you have a great rest of your week!  Rolando Cliche. Mordechai April, D.O. Gastroenterology and Hepatology [facility]

## 2024-02-29 ENCOUNTER — Encounter (HOSPITAL_COMMUNITY): Payer: Self-pay | Admitting: Internal Medicine

## 2024-02-29 NOTE — Telephone Encounter (Signed)
 Patient picked up medication per Cordella Deter from the front desk on 02/28/2024.

## 2024-03-07 ENCOUNTER — Other Ambulatory Visit (HOSPITAL_COMMUNITY)

## 2024-03-13 ENCOUNTER — Other Ambulatory Visit (HOSPITAL_COMMUNITY)
Admission: RE | Admit: 2024-03-13 | Discharge: 2024-03-13 | Disposition: A | Discharge status: Home / Self Care | Payer: Self-pay | Source: Ambulatory Visit | Attending: Oncology | Admitting: Oncology

## 2024-03-21 ENCOUNTER — Telehealth: Payer: Self-pay

## 2024-03-21 NOTE — Telephone Encounter (Signed)
 Dr Cindie can you please send in a Rx for Linzess 145 mcg to this pt's pharmacy. Please advise
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
## 2024-03-23 LAB — GENECONNECT MOLECULAR SCREEN: Genetic Analysis Overall Interpretation: NEGATIVE

## 2024-04-02 ENCOUNTER — Encounter: Payer: Self-pay | Admitting: Internal Medicine

## 2024-04-02 DIAGNOSIS — M45A7 Non-radiographic axial spondyloarthritis of lumbosacral region: Secondary | ICD-10-CM

## 2024-04-02 DIAGNOSIS — Z79899 Other long term (current) drug therapy: Secondary | ICD-10-CM

## 2024-04-02 MED ORDER — COSENTYX SENSOREADY (300 MG) 150 MG/ML ~~LOC~~ SOAJ: 300.0000 mg | SUBCUTANEOUS | 0 refills | Status: DC

## 2024-04-02 NOTE — Telephone Encounter (Signed)
 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

## 2024-04-04 LAB — CMP14+EGFR
ALT: 19 IU/L (ref 0–32)
AST: 20 IU/L (ref 0–40)
Albumin: 4.2 g/dL (ref 3.9–4.9)
Alkaline Phosphatase: 84 IU/L (ref 44–121)
BUN/Creatinine Ratio: 14 (ref 9–23)
BUN: 13 mg/dL (ref 6–24)
Bilirubin Total: 0.7 mg/dL (ref 0.0–1.2)
CO2: 21 mmol/L (ref 20–29)
Calcium: 8.8 mg/dL (ref 8.7–10.2)
Chloride: 103 mmol/L (ref 96–106)
Creatinine, Ser: 0.91 mg/dL (ref 0.57–1.00)
Globulin, Total: 3 g/dL (ref 1.5–4.5)
Glucose: 85 mg/dL (ref 70–99)
Potassium: 4.5 mmol/L (ref 3.5–5.2)
Sodium: 140 mmol/L (ref 134–144)
Total Protein: 7.2 g/dL (ref 6.0–8.5)
eGFR: 82 mL/min/1.73 (ref >59)

## 2024-04-04 LAB — LIPID PANEL
Chol/HDL Ratio: 4.7 ratio — ABNORMAL HIGH (ref 0.0–4.4)
Cholesterol, Total: 202 mg/dL — ABNORMAL HIGH (ref 100–199)
HDL: 43 mg/dL (ref >39)
LDL Chol Calc (NIH): 128 mg/dL — ABNORMAL HIGH (ref 0–99)
Triglycerides: 175 mg/dL — ABNORMAL HIGH (ref 0–149)
VLDL Cholesterol Cal: 31 mg/dL (ref 5–40)

## 2024-04-04 LAB — CBC WITH DIFFERENTIAL/PLATELET
Basophils Absolute: 0.1 x10E3/uL (ref 0.0–0.2)
Basos: 1 %
EOS (ABSOLUTE): 0.1 x10E3/uL (ref 0.0–0.4)
Eos: 2 %
Hematocrit: 39.8 % (ref 34.0–46.6)
Hemoglobin: 12.8 g/dL (ref 11.1–15.9)
Immature Grans (Abs): 0 x10E3/uL (ref 0.0–0.1)
Immature Granulocytes: 0 %
Lymphocytes Absolute: 2.6 x10E3/uL (ref 0.7–3.1)
Lymphs: 37 %
MCH: 27.9 pg (ref 26.6–33.0)
MCHC: 32.2 g/dL (ref 31.5–35.7)
MCV: 87 fL (ref 79–97)
Monocytes Absolute: 0.6 x10E3/uL (ref 0.1–0.9)
Monocytes: 8 %
Neutrophils Absolute: 3.8 x10E3/uL (ref 1.4–7.0)
Neutrophils: 52 %
Platelets: 315 x10E3/uL (ref 150–450)
RBC: 4.59 x10E6/uL (ref 3.77–5.28)
RDW: 13.2 % (ref 11.7–15.4)
WBC: 7.1 x10E3/uL (ref 3.4–10.8)

## 2024-04-04 LAB — TSH+FREE T4
Free T4: 0.99 ng/dL (ref 0.82–1.77)
TSH: 0.973 u[IU]/mL (ref 0.450–4.500)

## 2024-04-04 LAB — HEMOGLOBIN A1C
Est. average glucose Bld gHb Est-mCnc: 114 mg/dL
Hgb A1c MFr Bld: 5.6 % (ref 4.8–5.6)

## 2024-04-04 LAB — VITAMIN D 25 HYDROXY (VIT D DEFICIENCY, FRACTURES): Vit D, 25-Hydroxy: 47.3 ng/mL (ref 30.0–100.0)

## 2024-04-05 ENCOUNTER — Other Ambulatory Visit: Payer: Self-pay | Admitting: Internal Medicine

## 2024-04-05 ENCOUNTER — Encounter: Payer: Self-pay | Admitting: Internal Medicine

## 2024-04-05 ENCOUNTER — Ambulatory Visit: Attending: Internal Medicine | Admitting: Internal Medicine

## 2024-04-05 VITALS — BP 106/69 | HR 78 | Resp 16 | Ht 65.0 in | Wt 169.2 lb

## 2024-04-05 DIAGNOSIS — M45A7 Non-radiographic axial spondyloarthritis of lumbosacral region: Secondary | ICD-10-CM | POA: Diagnosis not present

## 2024-04-05 DIAGNOSIS — M222X1 Patellofemoral disorders, right knee: Secondary | ICD-10-CM | POA: Diagnosis not present

## 2024-04-05 DIAGNOSIS — Z79899 Other long term (current) drug therapy: Secondary | ICD-10-CM

## 2024-04-05 MED ORDER — COSENTYX SENSOREADY (300 MG) 150 MG/ML ~~LOC~~ SOAJ: 300.0000 mg | SUBCUTANEOUS | 2 refills | Status: DC

## 2024-04-05 MED ORDER — LINACLOTIDE 145 MCG PO CAPS: 145.0000 ug | ORAL_CAPSULE | Freq: Every day | ORAL | 3 refills | Status: DC

## 2024-04-05 NOTE — Telephone Encounter (Signed)
 Sent message in secure chat regarding a Rx for Linzess 

## 2024-04-06 ENCOUNTER — Encounter: Payer: Self-pay | Admitting: Family Medicine

## 2024-04-06 ENCOUNTER — Ambulatory Visit (INDEPENDENT_AMBULATORY_CARE_PROVIDER_SITE_OTHER): Admitting: Family Medicine

## 2024-04-06 VITALS — BP 107/70 | HR 75 | Resp 16 | Ht 65.0 in | Wt 169.4 lb

## 2024-04-06 DIAGNOSIS — F419 Anxiety disorder, unspecified: Secondary | ICD-10-CM | POA: Diagnosis not present

## 2024-04-06 DIAGNOSIS — R7301 Impaired fasting glucose: Secondary | ICD-10-CM | POA: Diagnosis not present

## 2024-04-06 DIAGNOSIS — E559 Vitamin D deficiency, unspecified: Secondary | ICD-10-CM

## 2024-04-06 DIAGNOSIS — I10 Essential (primary) hypertension: Secondary | ICD-10-CM

## 2024-04-06 DIAGNOSIS — E7849 Other hyperlipidemia: Secondary | ICD-10-CM

## 2024-04-06 DIAGNOSIS — G479 Sleep disorder, unspecified: Secondary | ICD-10-CM

## 2024-04-06 DIAGNOSIS — E038 Other specified hypothyroidism: Secondary | ICD-10-CM

## 2024-04-06 LAB — SEDIMENTATION RATE: Sed Rate: 28 mm/h — ABNORMAL HIGH (ref 0–20)

## 2024-04-06 LAB — C-REACTIVE PROTEIN: CRP: 12.6 mg/L — ABNORMAL HIGH (ref <8.0)

## 2024-04-06 MED ORDER — HYDROXYZINE PAMOATE 25 MG PO CAPS: 25.0000 mg | ORAL_CAPSULE | Freq: Three times a day (TID) | ORAL | 1 refills | Status: DC | PRN

## 2024-04-06 NOTE — Patient Instructions (Addendum)
 I appreciate the opportunity to provide care to you today!    Follow up:  4 months  Labs: please stop by the lab 2-3 days before your appt  to get your blood drawn (CBC, CMP, TSH, Lipid profile, HgA1c, Vit D)   Sleep Disturbance Start taking Hydroxyzine  25 mg as needed for sleep. You may increase the dose to 50 mg nightly after two weeks if tolerated and additional benefit is needed. Alternatively, if sensitivity or side effects occur, you may reduce the dose by taking half of a 25 mg tablet (12.5 mg).  Monitor for daytime drowsiness or other side effects, and follow up if sleep issues persist.  Please monitor for side effects, including:  Drowsiness or grogginess the next day Dizziness or lightheadedness Dry mouth Changes in mood or alertness Follow up if side effects become bothersome or if sleep disturbances persist.    Here are some foods to avoid or reduce in your diet to help manage cholesterol levels:  Fried Foods:Deep-fried items such as french fries, fried chicken, and fried snacks are high in unhealthy fats and can raise LDL (bad) cholesterol levels. Processed Meats:Foods like bacon, sausage, hot dogs, and deli meats are often high in saturated fat and cholesterol. Full-Fat Dairy Products:Whole milk, full-fat yogurt, butter, cream, and cheese are rich in saturated fats, which can increase cholesterol levels. Baked Goods and Sweets:Pastries, cakes, cookies, and donuts often contain trans fats and added sugars, which can raise LDL cholesterol and lower HDL (good) cholesterol. Red Meat:Beef, lamb, and pork are high in saturated fat. Lean cuts or plant-based protein alternatives are better options. Lard and Shortening:Used in some baked goods, lard and shortening are high in trans fats and should be avoided. Fast Food:Many fast food items are cooked with unhealthy oils and contain high amounts of saturated and trans fats. Processed Snacks:Chips, crackers, and certain microwave  popcorns can contain trans fats and high levels of unhealthy oils. Shellfish:While nutritious in other ways, some shellfish like shrimp, lobster, and crab are high in cholesterol. They should be consumed in moderation. Coconut and Palm Oils:these oils are high in saturated fat and can raise cholesterol levels when used in cooking or baking.     Please follow up if your symptoms worsen or fail to improve.     Please continue to a heart-healthy diet and increase your physical activities. Try to exercise for at least five days a week.    It was a pleasure to see you and I look forward to continuing to work together on your health and well-being. Please do not hesitate to call the office if you need care or have questions about your care.  In case of emergency, please visit the Emergency Department for urgent care, or contact our clinic at [phone] to schedule an appointment. We're here to help you!   Have a wonderful day and week. With Gratitude, Heyli Min MSN, FNP-BC

## 2024-04-06 NOTE — Progress Notes (Signed)
 Established Patient Office Visit  Subjective:  Patient ID: Gabriella Schmidt, female    DOB: 12-31-1980  Age: 43 y.o. MRN: [id number]  CC:  Chief Complaint  Patient presents with   Follow-up    4 month follow up. Still having trouble falling asleep everynight     HPI Gabriella Schmidt is a 43 y.o. female with past medical history of  Hypertension, sleep disturbance  depression and anxiety  presents for f/u of  chronic medical conditions.  For the details of today's visit, please refer to the assessment and plan.    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   Hypertension    IBS (irritable bowel syndrome) 02/2024   Pre-diabetes     Past Surgical History:  Procedure Laterality Date   COLONOSCOPY N/A 02/28/2024   Procedure: COLONOSCOPY;  Surgeon: Cindie Carlin POUR, DO;  Location: [facility];  Service: Endoscopy;  Laterality: N/A;  130pm, asa 2   ROBOTIC ASSISTED LAPAROSCOPIC HYSTERECTOMY AND SALPINGECTOMY Bilateral 05/17/2023   Procedure: XI ROBOTIC ASSISTED LAPAROSCOPIC HYSTERECTOMY AND SALPINGECTOMY;  Surgeon: Marilynn Nest, DO;  Location: [facility];  Service: Gynecology;  Laterality: Bilateral;   TOOTH EXTRACTION     TUBAL LIGATION N/A 06/29/2020   Procedure: POST PARTUM TUBAL LIGATION;  Surgeon: Herchel Gloris LABOR, MD;  Location: [facility];  Service: Gynecology;  Laterality: N/A;    Family History  Problem Relation Age of Onset   Hypertension Mother    Skin cancer Mother    Polycystic ovary syndrome Mother    Hypertension Father    Cardiomyopathy Father    Lymphoma Father    Obesity Brother    Obesity Brother    Obesity Brother    Hypertension Maternal Grandmother    Hypertension Maternal Grandfather    Healthy Daughter     Social History   Socioeconomic  History   Marital status: Married    Spouse name: Rory Montel   Number of children: 1   Years of education: Not on file   Highest education level: Bachelor's degree (e.g., BA, AB, BS)  Occupational History   Not on file  Tobacco Use   Smoking status: Former    Current packs/day: 0.00    Average packs/day: 0.5 packs/day for 13.0 years (6.5 ttl pk-yrs)    Types: Cigarettes    Start date: 01/2000    Quit date: 01/2013    Years since quitting: 11.2    Passive exposure: Past   Smokeless tobacco: Never  Vaping Use   Vaping status: Former   Quit date: 09/20/2018  Substance and Sexual Activity   Alcohol use: Not Currently   Drug use: Never   Sexual activity: Yes    Birth control/protection: Surgical    Comment: Hysterectomy  Other Topics Concern   Not on file  Social History Narrative   Not on file   Social Drivers of Health   Financial Resource Strain: Low Risk  (04/02/2024)   Overall Financial Resource Strain (CARDIA)    Difficulty of Paying Living Expenses: Not hard at all  Food Insecurity: No Food Insecurity (04/02/2024)   Hunger Vital Sign    Worried About Running Out of Food in the Last Year:  Never true    Ran Out of Food in the Last Year: Never true  Transportation Needs: No Transportation Needs (04/02/2024)   PRAPARE - Administrator, Civil Service (Medical): No    Lack of Transportation (Non-Medical): No  Physical Activity: Sufficiently Active (04/02/2024)   Exercise Vital Sign    Days of Exercise per Week: 3 days    Minutes of Exercise per Session: 60 min  Stress: Stress Concern Present (04/02/2024)   [employer] of Occupational Health - Occupational Stress Questionnaire    Feeling of Stress: Rather much  Social Connections: Moderately Integrated (04/02/2024)   Social Connection and Isolation Panel    Frequency of Communication with Friends and Family: More than three times a week    Frequency of Social Gatherings with Friends and Family: More than  three times a week    Attends Religious Services: More than 4 times per year    Active Member of [employer] or Organizations: No    Attends Banker Meetings: Not on file    Marital Status: Married  Catering manager Violence: Not At Risk (12/14/2022)   Humiliation, Afraid, Rape, and Kick questionnaire    Fear of Current or Ex-Partner: No    Emotionally Abused: No    Physically Abused: No    Sexually Abused: No    Outpatient Medications Prior to Visit  Medication Sig Dispense Refill   acetaminophen -codeine  (TYLENOL  #4) 300-60 MG tablet Take 1 tablet by mouth every 4 (four) hours as needed for moderate pain (pain score 4-6). 30 tablet 2   albuterol  (VENTOLIN  HFA) 108 (90 Base) MCG/ACT inhaler Inhale 2 puffs into the lungs every 6 (six) hours as needed for wheezing or shortness of breath. 8 g 2   diclofenac  (VOLTAREN ) 50 MG EC tablet TAKE 1 TO 2 TABLETS BY MOUTH DAILY AS NEEDED 60 tablet 1   dicyclomine  (BENTYL ) 10 MG capsule Take 1 capsule (10 mg total) by mouth in the morning, at noon, in the evening, and at bedtime. 120 capsule 11   gabapentin  (NEURONTIN ) 400 MG capsule Take 1 capsule (400 mg total) by mouth 2 (two) times daily. 60 capsule 2   linaclotide  (LINZESS ) 145 MCG CAPS capsule Take 1 capsule (145 mcg total) by mouth daily before breakfast. 90 capsule 3   Multiple Vitamin (MULTIVITAMIN) tablet Take 1 tablet by mouth daily.     Secukinumab , 300 MG Dose, (COSENTYX  SENSOREADY, 300 MG,) 150 MG/ML SOAJ Inject 2 mLs (300 mg total) into the skin every 28 (twenty-eight) days. 2 mL 2   sertraline  (ZOLOFT ) 100 MG tablet TAKE 2 TABLETS(200 MG) BY MOUTH DAILY 180 tablet 1   traZODone  (DESYREL ) 150 MG tablet TAKE 1 TABLET(150 MG) BY MOUTH AT BEDTIME AS NEEDED FOR SLEEP 90 tablet 0   traZODone  (DESYREL ) 50 MG tablet TAKE 1 TABLET(50 MG) BY MOUTH AT BEDTIME 90 tablet 0   Vitamin D , Ergocalciferol , (DRISDOL ) 1.25 MG (50000 UNIT) CAPS capsule Take 1 capsule (50,000 Units total) by mouth every  7 (seven) days. 27 capsule 2   diphenhydrAMINE  (BENADRYL ) 25 MG tablet Take 50 mg by mouth at bedtime as needed for allergies or sleep.     pyridOXINE (VITAMIN B6) 100 MG tablet Take 100 mg by mouth daily.     No facility-administered medications prior to visit.    Allergies  Allergen Reactions   Latex Rash    ROS Review of Systems  Constitutional:  Negative for chills and fever.  Eyes:  Negative for visual  disturbance.  Respiratory:  Negative for chest tightness and shortness of breath.   Neurological:  Negative for dizziness and headaches.  Psychiatric/Behavioral:  Positive for sleep disturbance.       Objective:    Physical Exam HENT:     Head: Normocephalic.     Mouth/Throat:     Mouth: Mucous membranes are moist.  Cardiovascular:     Rate and Rhythm: Normal rate.     Heart sounds: Normal heart sounds.  Pulmonary:     Effort: Pulmonary effort is normal.     Breath sounds: Normal breath sounds.  Neurological:     Mental Status: She is alert.     BP 107/70   Pulse 75   Resp 16   Ht 5' 5 (1.651 m)   Wt 169 lb 6.4 oz (76.8 kg)   LMP 09/20/2022 (Approximate)   SpO2 96%   BMI 28.19 kg/m  Wt Readings from Last 3 Encounters:  04/06/24 169 lb 6.4 oz (76.8 kg)  04/05/24 169 lb 3.2 oz (76.7 kg)  02/08/24 173 lb 1.6 oz (78.5 kg)    Lab Results  Component Value Date   TSH 0.973 04/03/2024   Lab Results  Component Value Date   WBC 7.1 04/03/2024   HGB 12.8 04/03/2024   HCT 39.8 04/03/2024   MCV 87 04/03/2024   PLT 315 04/03/2024   Lab Results  Component Value Date   NA 140 04/03/2024   K 4.5 04/03/2024   CO2 21 04/03/2024   GLUCOSE 85 04/03/2024   BUN 13 04/03/2024   CREATININE 0.91 04/03/2024   BILITOT 0.7 04/03/2024   ALKPHOS 84 04/03/2024   AST 20 04/03/2024   ALT 19 04/03/2024   PROT 7.2 04/03/2024   ALBUMIN 4.2 04/03/2024   CALCIUM 8.8 04/03/2024   ANIONGAP 10 05/13/2023   EGFR 82 04/03/2024   Lab Results  Component Value Date   CHOL  202 (H) 04/03/2024   Lab Results  Component Value Date   HDL 43 04/03/2024   Lab Results  Component Value Date   LDLCALC 128 (H) 04/03/2024   Lab Results  Component Value Date   TRIG 175 (H) 04/03/2024   Lab Results  Component Value Date   CHOLHDL 4.7 (H) 04/03/2024   Lab Results  Component Value Date   HGBA1C 5.6 04/03/2024      Assessment & Plan:  Chronic hypertension Assessment & Plan: Controlled without medication A low-sodium diet of less than 2300 mg daily is recommended, along with increased physical activity of moderate intensity, aiming for 150 minutes weekly. The patient is encouraged to continue with these lifestyle modifications to help manage their blood pressure effectively.  BP Readings from Last 3 Encounters:  04/06/24 107/70  04/05/24 106/69  02/28/24 (!) 99/54      Sleep disturbance Assessment & Plan:  Encouraged the patient to continue taking Trazodone  200 mg daily (150 mg in the morning and 50 mg at bedtime) as prescribed. Will initiate therapy with Hydroxyzine  25 mg as needed for ongoing difficulty with sleep initiation. Reviewed healthy sleep hygiene practices, including maintaining a consistent bedtime routine, limiting screen time before bed, and creating a comfortable sleep environment. Discussed potential side effects and adverse effects of both medications. All questions were addressed and answered thoroughly.    Anxiety Assessment & Plan: The patient reports difficulty shutting her mind down before bedtime, which interferes with her ability to fall asleep. Encouraged the patient to continue all other components of her current treatment regimen  as prescribed. Will initiate therapy with Hydroxyzine  25 mg as needed for sleep difficulty. Discussed alternatives, risks, benefits, and potential side effects of the medication. Informed consent was obtained for all medications and treatments. The patient was actively involved in the  decision-making and treatment planning process.   Orders: -     hydrOXYzine  Pamoate; Take 1 capsule (25 mg total) by mouth every 8 (eight) hours as needed.  Dispense: 90 capsule; Refill: 1  IFG (impaired fasting glucose) -     Hemoglobin A1c  Vitamin D  deficiency -     VITAMIN D  25 Hydroxy (Vit-D Deficiency, Fractures)  TSH (thyroid-stimulating hormone deficiency) -     TSH + free T4  Other hyperlipidemia -     Lipid panel -     CMP14+EGFR -     CBC with Differential/Platelet   Note: This chart has been completed using Engineer, civil (consulting) software, and while attempts have been made to ensure accuracy, certain words and phrases may not be transcribed as intended.   Follow-up: Return in about 4 months (around 08/07/2024).   Erian Lariviere, FNP

## 2024-04-08 ENCOUNTER — Ambulatory Visit: Payer: Self-pay | Admitting: Family Medicine

## 2024-04-08 DIAGNOSIS — G479 Sleep disorder, unspecified: Secondary | ICD-10-CM | POA: Insufficient documentation

## 2024-04-08 DIAGNOSIS — F419 Anxiety disorder, unspecified: Secondary | ICD-10-CM | POA: Insufficient documentation

## 2024-04-08 NOTE — Assessment & Plan Note (Addendum)
 The patient reports difficulty shutting her mind down before bedtime, which interferes with her ability to fall asleep. Encouraged the patient to continue all other components of her current treatment regimen as prescribed. Will initiate therapy with Hydroxyzine  25 mg as needed for sleep difficulty. Discussed alternatives, risks, benefits, and potential side effects of the medication. Informed consent was obtained for all medications and treatments. The patient was actively involved in the decision-making and treatment planning process.

## 2024-04-08 NOTE — Assessment & Plan Note (Signed)
  Encouraged the patient to continue taking Trazodone  200 mg daily (150 mg in the morning and 50 mg at bedtime) as prescribed. Will initiate therapy with Hydroxyzine  25 mg as needed for ongoing difficulty with sleep initiation. Reviewed healthy sleep hygiene practices, including maintaining a consistent bedtime routine, limiting screen time before bed, and creating a comfortable sleep environment. Discussed potential side effects and adverse effects of both medications. All questions were addressed and answered thoroughly.

## 2024-04-08 NOTE — Assessment & Plan Note (Signed)
 Controlled without medication A low-sodium diet of less than 2300 mg daily is recommended, along with increased physical activity of moderate intensity, aiming for 150 minutes weekly. The patient is encouraged to continue with these lifestyle modifications to help manage their blood pressure effectively.  BP Readings from Last 3 Encounters:  04/06/24 107/70  04/05/24 106/69  02/28/24 (!) 99/54

## 2024-04-12 ENCOUNTER — Ambulatory Visit: Admitting: Internal Medicine

## 2024-04-13 ENCOUNTER — Encounter: Payer: Self-pay | Admitting: Physical Medicine & Rehabilitation

## 2024-04-13 MED ORDER — ACETAMINOPHEN-CODEINE 300-60 MG PO TABS
1.0000 | ORAL_TABLET | Freq: Three times a day (TID) | ORAL | 2 refills | Status: DC | PRN
Start: 2024-04-13 — End: 2024-06-11

## 2024-04-19 ENCOUNTER — Ambulatory Visit: Admitting: Internal Medicine

## 2024-04-19 ENCOUNTER — Other Ambulatory Visit: Payer: Self-pay | Admitting: Family Medicine

## 2024-04-19 DIAGNOSIS — F418 Other specified anxiety disorders: Secondary | ICD-10-CM

## 2024-04-20 ENCOUNTER — Ambulatory Visit: Admitting: Internal Medicine

## 2024-04-20 ENCOUNTER — Encounter: Payer: Self-pay | Admitting: Internal Medicine

## 2024-04-20 VITALS — BP 97/65 | HR 71 | Temp 97.8°F | Ht 65.0 in | Wt 169.4 lb

## 2024-04-20 DIAGNOSIS — R140 Abdominal distension (gaseous): Secondary | ICD-10-CM

## 2024-04-20 DIAGNOSIS — R1032 Left lower quadrant pain: Secondary | ICD-10-CM

## 2024-04-20 DIAGNOSIS — K581 Irritable bowel syndrome with constipation: Secondary | ICD-10-CM | POA: Diagnosis not present

## 2024-04-20 MED ORDER — LINACLOTIDE 145 MCG PO CAPS
145.0000 ug | ORAL_CAPSULE | Freq: Every day | ORAL | 3 refills | Status: AC
Start: 2024-04-20 — End: 2025-04-20

## 2024-04-20 NOTE — Patient Instructions (Signed)
 Happy to hear that you are doing better.  Continue on Linzess  145 mcg daily.  I sent this to your mail-in pharmacy.  Continue dicyclomine .  Follow-up in 3 to 4 months.  It was very nice seeing you again today.  Dr. Cindie

## 2024-04-20 NOTE — Progress Notes (Signed)
 Primary Care Physician:  Zarwolo, Gloria, FNP Primary Gastroenterologist:  Dr. Cindie  Chief Complaint  Patient presents with   Follow-up    Pt arrives for follow up. Pt states she is still having the LLQ pain. Linzess  was $300 at [facility] and would like med sent to mail order. Has had Linzess  and it did work ok.     HPI:   Gabriella Schmidt is a 43 y.o. female who presents to clinic today for follow up visit.   Patient states for approximately 5 months she has had chronic left lower quadrant abdominal pain.  Always there, dull achy type pain.  1-2 times a day this will turn into severe debilitating pain that last approximately 45 seconds and then goes away.  She describes this as a gas bubble.  No melena hematochezia.  States she has a normal bowel movement once a day, [place] chart 2/3.  Does note associated abdominal bloating as well.  States she is bloated all day long.  Reports she had a colonoscopy when she was a teenager which she believes was WNL.  No family history of colorectal malignancy.  No family history of inflammatory bowel disease.  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  She had a pelvic ultrasound which was unremarkable, status post hysterectomy.  CT abdomen pelvis without contrast 02/02/2024 which I personally reviewed which was also unremarkable.  Colonoscopy 02/28/2024 normal terminal ileum, borderline colon prep, otherwise unremarkable.  Given samples of Linzess  145 mcg daily which she states helped her symptoms.  Unfortunately prescription was too expensive at [facility], requesting prescription sent to mail-in pharmacy.  Also started on dicyclomine  which she states is helping her abdominal pain.  Past Medical History:  Diagnosis Date   Anxiety    Arthritis    Asthma    Phreesia 09/01/2020   Degenerative cervical disc    Depression    Phreesia  09/01/2020   Dizziness    Edema    Elevated hemoglobin A1c    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   Hypertension    IBS (irritable bowel syndrome) 02/2024   Pre-diabetes     Past Surgical History:  Procedure Laterality Date   COLONOSCOPY N/A 02/28/2024   Procedure: COLONOSCOPY;  Surgeon: Cindie Carlin POUR, DO;  [facility];  Service: Endoscopy;  Laterality: N/A;  130pm, asa 2   ROBOTIC ASSISTED LAPAROSCOPIC HYSTERECTOMY AND SALPINGECTOMY Bilateral 05/17/2023   Procedure: XI ROBOTIC ASSISTED LAPAROSCOPIC HYSTERECTOMY AND SALPINGECTOMY;  Surgeon: Marilynn Nest, DO;  Location: [facility];  Service: Gynecology;  Laterality: Bilateral;   TOOTH EXTRACTION     TUBAL LIGATION N/A 06/29/2020   Procedure: POST PARTUM TUBAL LIGATION;  Surgeon: Herchel Gloris LABOR, MD;  Location: [facility];  Service: Gynecology;  Laterality: N/A;    Current Outpatient Medications  Medication Sig Dispense Refill   acetaminophen -codeine  (TYLENOL  #4) 300-60 MG tablet Take 1 tablet by mouth every 8 (eight) hours as needed for moderate pain (pain score 4-6). 30 tablet 2   albuterol  (VENTOLIN  HFA) 108 (90 Base) MCG/ACT inhaler Inhale 2 puffs into the lungs every 6 (six) hours as needed for wheezing or shortness of breath. 8 g 2   diclofenac  (VOLTAREN ) 50 MG EC tablet TAKE 1 TO 2 TABLETS BY MOUTH DAILY AS NEEDED 60 tablet 1   dicyclomine  (BENTYL ) 10 MG capsule Take 1 capsule (10 mg total) by mouth in the morning, at noon, in  the evening, and at bedtime. 120 capsule 11   gabapentin  (NEURONTIN ) 400 MG capsule Take 1 capsule (400 mg total) by mouth 2 (two) times daily. 60 capsule 2   hydrOXYzine  (VISTARIL ) 25 MG capsule Take 1 capsule (25 mg total) by mouth every 8 (eight) hours as needed. 90 capsule 1   Multiple Vitamin (MULTIVITAMIN) tablet Take 1 tablet by mouth daily.     Secukinumab , 300 MG Dose, (COSENTYX  SENSOREADY, 300 MG,) 150 MG/ML SOAJ Inject 2 mLs (300 mg total) into the skin every 28 (twenty-eight) days.  2 mL 2   sertraline  (ZOLOFT ) 100 MG tablet TAKE 2 TABLETS(200 MG) BY MOUTH DAILY 180 tablet 1   traZODone  (DESYREL ) 150 MG tablet TAKE 1 TABLET(150 MG) BY MOUTH AT BEDTIME AS NEEDED FOR SLEEP 90 tablet 0   traZODone  (DESYREL ) 50 MG tablet TAKE 1 TABLET(50 MG) BY MOUTH AT BEDTIME 90 tablet 0   Vitamin D , Ergocalciferol , (DRISDOL ) 1.25 MG (50000 UNIT) CAPS capsule Take 1 capsule (50,000 Units total) by mouth every 7 (seven) days. 27 capsule 2   No current facility-administered medications for this visit.    Allergies as of 04/20/2024 - Review Complete 04/20/2024  Allergen Reaction Noted   Latex Rash 01/31/2020    Family History  Problem Relation Age of Onset   Hypertension Mother    Skin cancer Mother    Polycystic ovary syndrome Mother    Hypertension Father    Cardiomyopathy Father    Lymphoma Father    Obesity Brother    Obesity Brother    Obesity Brother    Hypertension Maternal Grandmother    Hypertension Maternal Grandfather    Healthy Daughter     Social History   Socioeconomic History   Marital status: Married    Spouse name: Odis Wickey   Number of children: 1   Years of education: Not on file   Highest education level: Bachelor's degree (e.g., BA, AB, BS)  Occupational History   Not on file  Tobacco Use   Smoking status: Former    Current packs/day: 0.00    Average packs/day: 0.5 packs/day for 13.0 years (6.5 ttl pk-yrs)    Types: Cigarettes    Start date: 01/2000    Quit date: 01/2013    Years since quitting: 11.2    Passive exposure: Past   Smokeless tobacco: Never  Vaping Use   Vaping status: Former   Quit date: 09/20/2018  Substance and Sexual Activity   Alcohol use: Not Currently   Drug use: Never   Sexual activity: Yes    Birth control/protection: Surgical    Comment: Hysterectomy  Other Topics Concern   Not on file  Social History Narrative   Not on file   Social Drivers of Health   Financial Resource Strain: Low Risk  (04/02/2024)    Overall Financial Resource Strain (CARDIA)    Difficulty of Paying Living Expenses: Not hard at all  Food Insecurity: No Food Insecurity (04/02/2024)   Hunger Vital Sign    Worried About Running Out of Food in the Last Year: Never true    Ran Out of Food in the Last Year: Never true  Transportation Needs: No Transportation Needs (04/02/2024)   PRAPARE - Administrator, Civil Service (Medical): No    Lack of Transportation (Non-Medical): No  Physical Activity: Sufficiently Active (04/02/2024)   Exercise Vital Sign    Days of Exercise per Week: 3 days    Minutes of Exercise per Session: 60 min  Stress: Stress Concern Present (04/02/2024)   [employer] of Occupational Health - Occupational Stress Questionnaire    Feeling of Stress: Rather much  Social Connections: Moderately Integrated (04/02/2024)   Social Connection and Isolation Panel    Frequency of Communication with Friends and Family: More than three times a week    Frequency of Social Gatherings with Friends and Family: More than three times a week    Attends Religious Services: More than 4 times per year    Active Member of [employer] or Organizations: No    Attends Banker Meetings: Not on file    Marital Status: Married  Catering manager Violence: Not At Risk (12/14/2022)   Humiliation, Afraid, Rape, and Kick questionnaire    Fear of Current or Ex-Partner: No    Emotionally Abused: No    Physically Abused: No    Sexually Abused: No    Subjective: Review of Systems  Constitutional:  Negative for chills and fever.  HENT:  Negative for congestion and hearing loss.   Eyes:  Negative for blurred vision and double vision.  Respiratory:  Negative for cough and shortness of breath.   Cardiovascular:  Negative for chest pain and palpitations.  Gastrointestinal:  Positive for abdominal pain. Negative for blood in stool, constipation, diarrhea, heartburn, melena and vomiting.  Genitourinary:  Negative for  dysuria and urgency.  Musculoskeletal:  Negative for joint pain and myalgias.  Skin:  Negative for itching and rash.  Neurological:  Negative for dizziness and headaches.  Psychiatric/Behavioral:  Negative for depression. The patient is not nervous/anxious.        Objective: BP 97/65   Pulse 71   Temp 97.8 F (36.6 C)   Ht 5' 5 (1.651 m)   Wt 169 lb 6.4 oz (76.8 kg)   LMP 09/20/2022 (Approximate)   BMI 28.19 kg/m  Physical Exam Constitutional:      Appearance: Normal appearance.  HENT:     Head: Normocephalic and atraumatic.  Eyes:     Extraocular Movements: Extraocular movements intact.     Conjunctiva/sclera: Conjunctivae normal.  Cardiovascular:     Rate and Rhythm: Normal rate and regular rhythm.  Pulmonary:     Effort: Pulmonary effort is normal.     Breath sounds: Normal breath sounds.  Abdominal:     General: Bowel sounds are normal.     Palpations: Abdomen is soft.     Tenderness: There is abdominal tenderness.  Musculoskeletal:        General: No swelling. Normal range of motion.     Cervical back: Normal range of motion and neck supple.  Skin:    General: Skin is warm and dry.     Coloration: Skin is not jaundiced.  Neurological:     General: No focal deficit present.     Mental Status: She is alert and oriented to person, place, and time.  Psychiatric:        Mood and Affect: Mood normal.        Behavior: Behavior normal.      Assessment/Plan:  1.  Irritable bowel syndrome with constipation-symptoms improved on Linzess .  Will send formal prescription to mail-in pharmacy.  Continue dicyclomine  as needed.  Continue low FODMAP diet.  2.  Abdominal pain-due to above  3.  Abdominal bloating-due to above  Colonoscopy recall at age 66  Follow-up in 3 to 4 months.  04/20/2024 8:40 AM   Disclaimer: This note was dictated with voice recognition software. Similar sounding words can  inadvertently be transcribed and may not be corrected upon  review.

## 2024-04-27 ENCOUNTER — Encounter: Payer: Self-pay | Admitting: Family Medicine

## 2024-04-29 ENCOUNTER — Other Ambulatory Visit: Payer: Self-pay | Admitting: Family Medicine

## 2024-04-29 DIAGNOSIS — G4700 Insomnia, unspecified: Secondary | ICD-10-CM

## 2024-05-01 ENCOUNTER — Encounter: Payer: Self-pay | Admitting: Family Medicine

## 2024-05-01 ENCOUNTER — Other Ambulatory Visit: Payer: Self-pay | Admitting: Internal Medicine

## 2024-05-01 DIAGNOSIS — M45A7 Non-radiographic axial spondyloarthritis of lumbosacral region: Secondary | ICD-10-CM

## 2024-05-01 NOTE — Telephone Encounter (Signed)
 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

## 2024-05-01 NOTE — Telephone Encounter (Signed)
 Letter drafted and published to patient's MyChart for ease of access.

## 2024-05-02 ENCOUNTER — Ambulatory Visit: Payer: Self-pay | Admitting: Internal Medicine

## 2024-05-02 NOTE — Telephone Encounter (Signed)
 Encounter in results note.

## 2024-05-02 NOTE — Telephone Encounter (Signed)
 Now addressed in associated result note

## 2024-05-02 NOTE — Progress Notes (Signed)
 Sed rate is partially improved to 28 down from 46.  CRP is partially improved down to 12.6 from 14.5.  These are still elevated above normal but better than she was before starting the Cosentyx .  I recommend we continue this medication for now without additional dose changes.

## 2024-05-13 ENCOUNTER — Other Ambulatory Visit: Payer: Self-pay | Admitting: Family Medicine

## 2024-05-13 DIAGNOSIS — G4700 Insomnia, unspecified: Secondary | ICD-10-CM

## 2024-05-14 ENCOUNTER — Other Ambulatory Visit: Payer: Self-pay | Admitting: Physical Medicine & Rehabilitation

## 2024-05-16 ENCOUNTER — Other Ambulatory Visit: Payer: Self-pay

## 2024-05-16 DIAGNOSIS — M45A7 Non-radiographic axial spondyloarthritis of lumbosacral region: Secondary | ICD-10-CM

## 2024-05-16 DIAGNOSIS — Z79899 Other long term (current) drug therapy: Secondary | ICD-10-CM

## 2024-05-16 DIAGNOSIS — Z9225 Personal history of immunosupression therapy: Secondary | ICD-10-CM

## 2024-05-16 DIAGNOSIS — Z111 Encounter for screening for respiratory tuberculosis: Secondary | ICD-10-CM

## 2024-05-16 NOTE — Telephone Encounter (Signed)
 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  Contacted patient to advise we needed a updated TB Gold, patient verbalized understanding and said she would be by in the morning.

## 2024-05-16 NOTE — Telephone Encounter (Signed)
Refill placed today

## 2024-05-17 ENCOUNTER — Other Ambulatory Visit: Payer: Self-pay | Admitting: *Deleted

## 2024-05-17 DIAGNOSIS — Z111 Encounter for screening for respiratory tuberculosis: Secondary | ICD-10-CM

## 2024-05-17 DIAGNOSIS — Z9225 Personal history of immunosupression therapy: Secondary | ICD-10-CM

## 2024-05-18 MED ORDER — COSENTYX SENSOREADY (300 MG) 150 MG/ML ~~LOC~~ SOAJ: 300.0000 mg | SUBCUTANEOUS | 2 refills | Status: DC

## 2024-05-21 LAB — QUANTIFERON-TB GOLD PLUS
Mitogen-NIL: >10 [IU]/mL
NIL: 0.02 [IU]/mL
QuantiFERON-TB Gold Plus: NEGATIVE
TB1-NIL: 0 [IU]/mL
TB2-NIL: 0.03 [IU]/mL

## 2024-06-07 ENCOUNTER — Encounter: Payer: Self-pay | Admitting: Internal Medicine

## 2024-06-10 ENCOUNTER — Other Ambulatory Visit: Payer: Self-pay | Admitting: Physical Medicine & Rehabilitation

## 2024-06-29 ENCOUNTER — Encounter: Payer: Self-pay | Admitting: Physical Medicine & Rehabilitation

## 2024-06-29 ENCOUNTER — Encounter: Attending: Physical Medicine & Rehabilitation | Admitting: Physical Medicine & Rehabilitation

## 2024-06-29 VITALS — BP 133/84 | HR 76 | Ht 65.0 in | Wt 169.0 lb

## 2024-06-29 DIAGNOSIS — M25562 Pain in left knee: Secondary | ICD-10-CM | POA: Diagnosis not present

## 2024-06-29 DIAGNOSIS — G8929 Other chronic pain: Secondary | ICD-10-CM | POA: Insufficient documentation

## 2024-06-29 DIAGNOSIS — M5442 Lumbago with sciatica, left side: Secondary | ICD-10-CM | POA: Diagnosis not present

## 2024-06-29 DIAGNOSIS — G894 Chronic pain syndrome: Secondary | ICD-10-CM | POA: Diagnosis present

## 2024-06-29 MED ORDER — CYCLOBENZAPRINE HCL 5 MG PO TABS: 5.0000 mg | ORAL_TABLET | Freq: Three times a day (TID) | ORAL | 0 refills | Status: AC | PRN

## 2024-06-29 MED ORDER — KETOROLAC TROMETHAMINE 60 MG/2ML IM SOLN
60.0000 mg | Freq: Once | INTRAMUSCULAR | Status: AC
  Administered 2024-06-29: 60 mg via INTRAMUSCULAR

## 2024-06-29 NOTE — Progress Notes (Signed)
 Subjective:  43-year-old female who has history of chronic low back pain started when she was a teenager.  She had been on Darvocet when she was as young is 43 years old.  More recently the patient has been seeing pain management in [facility]  prior to moving to [place] in 2021.  She had a multipronged approach including injections and oral medications.  She has had physical therapy in the past without much benefit.  The patient has tried tramadol for pain which made her sick.  She had been on hydrocodone up to 4 tablets/day but then was weaned down to 2 tablets a day and then once she became pregnant this was discontinued.  She had some relief with gabapentin  300 mg 3 times daily. Her pain is primarily in the low back area since her pregnancy which included back labor she has had more tailbone pain.  She occasionally has some pain going down the right lower extremity. She has had no recent falls or trauma.  She has had no recent imaging studies.  She had imaging studies in [facility].  In regards to injections she remembers little detail.  There are no records from [facility] to review. Epidural - worked 1 st time but not second Sacroiliac- not sure about result Not sure about facet injections or medial branch blocks but did not have radiofrequency neurotomy.  Was offered Spinal cord stim but pt declined  Patient ID: Alberta LOISE Sharps, female    DOB: 1980-10-22, 43 y.o.   MRN: [id number]  HPI Discussed the use of AI scribe software for clinical note transcription with the patient, who gave verbal consent to proceed.  History of Present Illness Kalisa LOISE Luhmann is a 43 year old female who presents with severe back pain radiating to the hip and leg.  Three weeks ago, she tripped and landed on her left knee, which was skinned but not significantly painful at the time. Two weeks ago, she experienced severe back pain upon standing from a recliner, which has since radiated to her hip and  down her leg. The pain is intermittent, severe, and affects her ability to roll over in bed and lift herself.  The pain is primarily located in her back, radiating to her hip and down her leg. She describes an achiness in her bones and experiences weakness, making it difficult to lift objects like a casserole dish. No new bowel or bladder issues are noted, although she has IBS. She describes an achiness and bone pain sensation in her left leg, and reports weakness, but does not report current numbness or tingling.  She has been taking Tylenol  4, gabapentin , and diclofenac  for pain, but none provide relief. Heat and ice have also been ineffective. Her condition impacts her ability to work, which requires frequent movement and being on her feet.  In the review of symptoms, she denies dragging her foot and believes she walks normally. She recalls a history of temporary numbness in her left leg years ago, but not recently. She has not experienced any major trauma, only a trip that led to her current condition.    Pain Inventory Average Pain 8 Pain Right Now 8 My pain is sharp, stabbing, and aching  In the last 24 hours, has pain interfered with the following? General activity 7 Relation with others 5 Enjoyment of life 7 What TIME of day is your pain at its worst? morning , daytime, evening, and night Sleep (in general) Fair  Pain is  worse with: walking, bending, sitting, and standing Pain improves with: medication Relief from Meds: 6  Family History  Problem Relation Age of Onset   Hypertension Mother    Skin cancer Mother    Polycystic ovary syndrome Mother    Hypertension Father    Cardiomyopathy Father    Lymphoma Father    Obesity Brother    Obesity Brother    Obesity Brother    Hypertension Maternal Grandmother    Hypertension Maternal Grandfather    Healthy Daughter    Social History   Socioeconomic History   Marital status: Married    Spouse name: Laketia Vicknair   Number  of children: 1   Years of education: Not on file   Highest education level: Bachelor's degree (e.g., BA, AB, BS)  Occupational History   Not on file  Tobacco Use   Smoking status: Former    Current packs/day: 0.00    Average packs/day: 0.5 packs/day for 13.0 years (6.5 ttl pk-yrs)    Types: Cigarettes    Start date: 01/2000    Quit date: 01/2013    Years since quitting: 11.4    Passive exposure: Past   Smokeless tobacco: Never  Vaping Use   Vaping status: Former   Quit date: 09/20/2018  Substance and Sexual Activity   Alcohol use: Not Currently   Drug use: Never   Sexual activity: Yes    Birth control/protection: Surgical    Comment: Hysterectomy  Other Topics Concern   Not on file  Social History Narrative   Not on file   Social Drivers of Health   Financial Resource Strain: Low Risk  (04/02/2024)   Overall Financial Resource Strain (CARDIA)    Difficulty of Paying Living Expenses: Not hard at all  Food Insecurity: No Food Insecurity (04/02/2024)   Hunger Vital Sign    Worried About Running Out of Food in the Last Year: Never true    Ran Out of Food in the Last Year: Never true  Transportation Needs: No Transportation Needs (04/02/2024)   PRAPARE - Administrator, Civil Service (Medical): No    Lack of Transportation (Non-Medical): No  Physical Activity: Sufficiently Active (04/02/2024)   Exercise Vital Sign    Days of Exercise per Week: 3 days    Minutes of Exercise per Session: 60 min  Stress: Stress Concern Present (04/02/2024)   [employer] of Occupational Health - Occupational Stress Questionnaire    Feeling of Stress: Rather much  Social Connections: Moderately Integrated (04/02/2024)   Social Connection and Isolation Panel    Frequency of Communication with Friends and Family: More than three times a week    Frequency of Social Gatherings with Friends and Family: More than three times a week    Attends Religious Services: More than 4 times per  year    Active Member of [employer] or Organizations: No    Attends Banker Meetings: Not on file    Marital Status: Married   Past Surgical History:  Procedure Laterality Date   COLONOSCOPY N/A 02/28/2024   Procedure: COLONOSCOPY;  Surgeon: Cindie Carlin POUR, DO;  Location: [facility];  Service: Endoscopy;  Laterality: N/A;  130pm, asa 2   ROBOTIC ASSISTED LAPAROSCOPIC HYSTERECTOMY AND SALPINGECTOMY Bilateral 05/17/2023   Procedure: XI ROBOTIC ASSISTED LAPAROSCOPIC HYSTERECTOMY AND SALPINGECTOMY;  Surgeon: Marilynn Nest, DO;  Location: [facility];  Service: Gynecology;  Laterality: Bilateral;   TOOTH EXTRACTION     TUBAL LIGATION N/A 06/29/2020  Procedure: POST PARTUM TUBAL LIGATION;  Surgeon: Herchel Gloris LABOR, MD;  Location: [facility];  Service: Gynecology;  Laterality: N/A;   Past Surgical History:  Procedure Laterality Date   COLONOSCOPY N/A 02/28/2024   Procedure: COLONOSCOPY;  Surgeon: Cindie Carlin POUR, DO;  Location: [facility];  Service: Endoscopy;  Laterality: N/A;  130pm, asa 2   ROBOTIC ASSISTED LAPAROSCOPIC HYSTERECTOMY AND SALPINGECTOMY Bilateral 05/17/2023   Procedure: XI ROBOTIC ASSISTED LAPAROSCOPIC HYSTERECTOMY AND SALPINGECTOMY;  Surgeon: Marilynn Nest, DO;  Location: [facility];  Service: Gynecology;  Laterality: Bilateral;   TOOTH EXTRACTION     TUBAL LIGATION N/A 06/29/2020   Procedure: POST PARTUM TUBAL LIGATION;  Surgeon: Herchel Gloris LABOR, MD;  Location: [facility];  Service: Gynecology;  Laterality: N/A;   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   Hypertension    IBS (irritable bowel syndrome) 02/2024   Pre-diabetes    BP 133/84   Pulse 76   Ht 5' 5 (1.651 m)   Wt 169 lb (76.7 kg)   LMP 09/20/2022 (Approximate)   SpO2 98%   BMI  28.12 kg/m   Opioid Risk Score:   Fall Risk Score:  `1  Depression screen [facility] 2/9     04/06/2024    8:44 AM 12/09/2023   11:37 AM 10/25/2023    2:50 PM 06/21/2023   12:20 PM 06/07/2023    2:31 PM 03/22/2023    9:40 AM 02/22/2023   12:59 PM  Depression screen PHQ 2/9  Decreased Interest 0 0 0 0 0 1 0  Down, Depressed, Hopeless 0 0 0 0 0 1 0  PHQ - 2 Score 0 0 0 0 0 2 0    Review of Systems  Musculoskeletal:  Positive for back pain.       Left leg pain  Psychiatric/Behavioral:  Negative for confusion.   All other systems reviewed and are negative.      Objective:   Physical Exam  General no acute distress Mood and affect appropriate Extremities without edema Sensation pinprick reduced in the left L4 L5-S1 dermatome distribution Negative straight leg raising bilaterally Deep tendon reflexes 2+ bilateral knees 3+ bilateral ankles Ambulates without assistive device no evidence of toe drag and instability Lumbar spine is tenderness palpation left side greater than right side in the lower lumbar and upper sacral areas. Gaenslens: Negative pain in SI area Sacral thrust (prone) : Positive left SI  FABER's: Negative Distraction (supine): Negative Thigh thrust test: Negative  Motor strength is 5/5 bilateral hip flexors and right knee extensors, left knee extensors 4/5 pain inhibited Ankle dorsiflexors 4+ left 5/5 right     Assessment & Plan:   Assessment and Plan Assessment & Plan Acute low back pain with left lower extremity symptoms (lumbar radiculopathy) Acute low back pain with left leg symptoms, likely lumbar radiculopathy. Considered facet or sacroiliac joint involvement.  - Order lumbar spine X-ray. - Refer to physical therapy at [facility] in [place]. - Administer Toradol  injection today 60 mg IM x 1, skip diclofenac  dose tonight. - Prescribe cyclobenzaprine  5 mg for muscle relaxation, to be taken both day and night. - Follow up in one month to assess progress and  consider MRI if symptoms persist.

## 2024-06-29 NOTE — Patient Instructions (Signed)
  VISIT SUMMARY: Today, you were seen for severe back pain radiating to your hip and leg, which started after a fall three weeks ago. The pain has been affecting your daily activities and ability to work.  YOUR PLAN: ACUTE LOW BACK PAIN WITH LEFT LOWER EXTREMITY SYMPTOMS (LUMBAR RADICULOPATHY): You have severe back pain that radiates to your hip and leg, likely due to a condition affecting the nerves in your lower back. -We will order an X-ray of your lumbar spine to get a better look at what might be causing your pain. -You are being referred to physical therapy at [facility] in [place] to help manage your pain and improve your mobility. -You received a Toradol  injection today to help with the pain. Skip your diclofenac  dose tonight. -You have been prescribed cyclobenzaprine  5 mg to help relax your muscles. Take this both during the day and at night. -Please follow up in one month to check your progress. If your symptoms persist, we may consider an MRI.                      Contains text generated by Abridge.                                 Contains text generated by Abridge.

## 2024-07-05 ENCOUNTER — Ambulatory Visit
Admission: RE | Admit: 2024-07-05 | Discharge: 2024-07-05 | Disposition: A | Discharge status: Home / Self Care | Source: Ambulatory Visit | Attending: Physical Medicine & Rehabilitation | Admitting: Physical Medicine & Rehabilitation

## 2024-07-05 DIAGNOSIS — M5442 Lumbago with sciatica, left side: Secondary | ICD-10-CM

## 2024-07-10 MED ORDER — ACETAMINOPHEN-CODEINE 300-60 MG PO TABS: 1.0000 | ORAL_TABLET | ORAL | 0 refills | Status: DC | PRN
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
## 2024-07-10 NOTE — Addendum Note (Signed)
 Addended by: CARILYN PRENTICE BRAVO on: 07/10/2024 04:35 PM   Modules accepted: Orders

## 2024-07-13 ENCOUNTER — Ambulatory Visit: Admitting: Physical Medicine & Rehabilitation

## 2024-07-20 ENCOUNTER — Encounter: Payer: Self-pay | Admitting: Internal Medicine

## 2024-07-20 ENCOUNTER — Ambulatory Visit: Attending: Internal Medicine | Admitting: Internal Medicine

## 2024-07-20 VITALS — BP 126/82 | HR 75 | Temp 97.8°F | Resp 13 | Ht 65.0 in | Wt 174.6 lb

## 2024-07-20 DIAGNOSIS — Z79899 Other long term (current) drug therapy: Secondary | ICD-10-CM | POA: Diagnosis not present

## 2024-07-20 DIAGNOSIS — M45A7 Non-radiographic axial spondyloarthritis of lumbosacral region: Secondary | ICD-10-CM | POA: Diagnosis not present

## 2024-07-20 DIAGNOSIS — M222X1 Patellofemoral disorders, right knee: Secondary | ICD-10-CM | POA: Diagnosis not present

## 2024-07-20 MED ORDER — COSENTYX SENSOREADY (300 MG) 150 MG/ML ~~LOC~~ SOAJ: 300.0000 mg | SUBCUTANEOUS | 2 refills | Status: AC

## 2024-07-21 ENCOUNTER — Encounter: Payer: Self-pay | Admitting: Physical Medicine & Rehabilitation

## 2024-07-21 LAB — CBC WITH DIFFERENTIAL/PLATELET
Absolute Lymphocytes: 2033 {cells}/uL (ref 850–3900)
Absolute Monocytes: 544 {cells}/uL (ref 200–950)
Basophils Absolute: 48 {cells}/uL (ref 0–200)
Basophils Relative: 0.7 %
Eosinophils Absolute: 122 {cells}/uL (ref 15–500)
Eosinophils Relative: 1.8 %
HCT: 38.1 % (ref 35.0–45.0)
Hemoglobin: 12.1 g/dL (ref 11.7–15.5)
MCH: 27.8 pg (ref 27.0–33.0)
MCHC: 31.8 g/dL — ABNORMAL LOW (ref 32.0–36.0)
MCV: 87.6 fL (ref 80.0–100.0)
MPV: 10.5 fL (ref 7.5–12.5)
Monocytes Relative: 8 %
Neutro Abs: 4053 {cells}/uL (ref 1500–7800)
Neutrophils Relative %: 59.6 %
Platelets: 321 Thousand/uL (ref 140–400)
RBC: 4.35 Million/uL (ref 3.80–5.10)
RDW: 13.1 % (ref 11.0–15.0)
Total Lymphocyte: 29.9 %
WBC: 6.8 Thousand/uL (ref 3.8–10.8)

## 2024-07-21 LAB — C-REACTIVE PROTEIN: CRP: 12.8 mg/L — ABNORMAL HIGH (ref <8.0)

## 2024-07-21 LAB — COMPREHENSIVE METABOLIC PANEL WITH GFR
AG Ratio: 1.4 (calc) (ref 1.0–2.5)
ALT: 25 U/L (ref 6–29)
AST: 21 U/L (ref 10–30)
Albumin: 4.2 g/dL (ref 3.6–5.1)
Alkaline phosphatase (APISO): 79 U/L (ref 31–125)
BUN: 11 mg/dL (ref 7–25)
CO2: 28 mmol/L (ref 20–32)
Calcium: 8.5 mg/dL — ABNORMAL LOW (ref 8.6–10.2)
Chloride: 103 mmol/L (ref 98–110)
Creat: 0.68 mg/dL (ref 0.50–0.99)
Globulin: 3.1 g/dL (ref 1.9–3.7)
Glucose, Bld: 90 mg/dL (ref 65–99)
Potassium: 3.9 mmol/L (ref 3.5–5.3)
Sodium: 139 mmol/L (ref 135–146)
Total Bilirubin: 0.6 mg/dL (ref 0.2–1.2)
Total Protein: 7.3 g/dL (ref 6.1–8.1)
eGFR: 113 mL/min/1.73m2 (ref >=60)

## 2024-07-21 LAB — SEDIMENTATION RATE: Sed Rate: 28 mm/h — ABNORMAL HIGH (ref 0–20)

## 2024-07-23 ENCOUNTER — Other Ambulatory Visit: Payer: Self-pay | Admitting: Family Medicine

## 2024-07-23 DIAGNOSIS — G4700 Insomnia, unspecified: Secondary | ICD-10-CM

## 2024-08-01 NOTE — Therapy (Addendum)
 OUTPATIENT PHYSICAL THERAPY THORACOLUMBAR EVALUATION   Patient Name: Gabriella Schmidt MRN: [id number] DOB:1980-10-15, 43 y.o., female Today's Date: 08/13/2024  END OF SESSION:     08/03/24 0908  PT Visits / Re-Eval  Visit Number 1  Number of Visits 6  Date for Recertification  09/14/24  Authorization  Authorization Type [facility]  Authorization Time Period No auth; no visit limit  PT Time Calculation  PT Start Time 0905  PT Stop Time 0945  PT Time Calculation (min) 40 min  PT - End of Session  Activity Tolerance Patient tolerated treatment well;Patient limited by pain  Behavior During Therapy [facility] for tasks assessed/performed   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   Hypertension    IBS (irritable bowel syndrome) 02/2024   Pre-diabetes    Past Surgical History:  Procedure Laterality Date   COLONOSCOPY N/A 02/28/2024   Procedure: COLONOSCOPY;  Surgeon: Cindie Carlin POUR, DO;  [facility];  Service: Endoscopy;  Laterality: N/A;  130pm, asa 2   ROBOTIC ASSISTED LAPAROSCOPIC HYSTERECTOMY AND SALPINGECTOMY Bilateral 05/17/2023   Procedure: XI ROBOTIC ASSISTED LAPAROSCOPIC HYSTERECTOMY AND SALPINGECTOMY;  Surgeon: Marilynn Nest, DO;  Location: [facility];  Service: Gynecology;  Laterality: Bilateral;   TOOTH EXTRACTION     TUBAL LIGATION N/A 06/29/2020   Procedure: POST PARTUM TUBAL LIGATION;  Surgeon: Herchel Gloris LABOR, MD;  Location: [facility];  Service: Gynecology;  Laterality: N/A;   Patient Active Problem List   Diagnosis Date Noted   Sleep disturbance 04/08/2024   Anxiety 04/08/2024   Dyspareunia in female 12/30/2023   S/P hysterectomy 12/30/2023   Left lower quadrant abdominal pain 12/30/2023   Patellofemoral pain syndrome 08/30/2023   Right  foot pain 06/10/2023   Dysmenorrhea 05/17/2023   Trochanteric bursitis of both hips 03/22/2023   Encounter for gynecological examination with Papanicolaou smear of cervix 12/14/2022   Hyperglycemia 10/19/2022   High risk medication use 09/28/2022   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/2022   Bilateral sacroiliitis 07/27/2022   CRP elevated 07/27/2022   Positive ANA (antinuclear antibody) 07/27/2022   Pain of both breasts 07/07/2022   Pregnancy examination or test, negative result 07/07/2022   Prolonged periods 07/07/2022   Menorrhagia with regular cycle 03/31/2022   Uterine cramping 03/31/2022   Dyspareunia, female 03/17/2022   Urinary incontinence 03/17/2022   Hematuria 03/17/2022   Overweight (BMI 25.0-29.9) 01/05/2022   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   School health examination 12/10/2021   Weight gain 11/23/2021   Fatigue 11/23/2021   Nausea 11/23/2021   Breast tenderness 11/23/2021   Mass of upper outer quadrant of left breast 11/23/2021   Mass of lower inner quadrant of right breast 11/23/2021   Proteinuria 11/23/2021   Urinary frequency 11/23/2021   Pregnancy test negative 11/23/2021   Anemia 09/02/2021   COVID-19 04/17/2021   Insomnia 01/14/2021   Back pain 01/14/2021   Liver function test abnormality 10/06/2020   Hyperlipidemia 10/06/2020   Dizziness 10/06/2020   Degenerative disc disease at L5-S1 level 10/06/2020   Asthma 09/01/2020   Chronic hypertension 01/31/2020   Depression with anxiety 01/31/2020    PCP: Edman Meade PEDLAR, FNP  REFERRING PROVIDER: Carilyn Barter  E, MD  REFERRING DIAG: M54.42 (ICD-10-CM) - Acute left-sided low back pain with left-sided sciatica  Rationale for Evaluation and Treatment: Rehabilitation  THERAPY DIAG:  Low back pain, unspecified back pain laterality, unspecified chronicity, unspecified whether sciatica present - Plan: PT plan of care cert/re-cert  Difficulty in walking,  not elsewhere classified - Plan: PT plan of care cert/re-cert  Muscle weakness (generalized) - Plan: PT plan of care cert/re-cert  Radiculopathy, lumbar region - Plan: PT plan of care cert/re-cert  ONSET DATE: 2 months ago  SUBJECTIVE:                                                                                                                                                                                           SUBJECTIVE STATEMENT: Back pain started about 2 months ago; tripped and fell at church; landed on her left knee.  Initially could not stand up straight for about a week; now its going into her left hip and down to knee and ankle; Not getting any better; also sees MD today; standing is the worst for her leg pain.  Left leg sometimes feels like it gives way  PERTINENT HISTORY:  History of chronic back pain prior to fall  PAIN:  Are you having pain? Yes: NPRS scale: 9/10, best 4-5/10 Pain location: low back and down left leg Pain description: aching in back Aggravating factors: standing prolonged, walking Relieving factors: rest, medication  PRECAUTIONS: None  RED FLAGS: None   WEIGHT BEARING RESTRICTIONS: No  FALLS:  Has patient fallen in last 6 months? Yes. 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    PLOF: Independent  PATIENT GOALS: to be able to stand in one place without leg pain  NEXT MD VISIT: today  OBJECTIVE:  Note: Objective measures were completed at Evaluation unless otherwise noted.  DIAGNOSTIC FINDINGS:  CLINICAL DATA:  Left-sided low back pain.   EXAM: LUMBAR SPINE - 2-3 VIEW   COMPARISON:  CT abdomen pelvis 02/02/2024   FINDINGS: Normal anatomic alignment. No evidence for acute fracture or dislocation. L5-S1 degenerative disc disease. Preservation of the vertebral body heights. SI joints unremarkable.   IMPRESSION: L5-S1 degenerative disc disease.     Electronically Signed   By: Bard Moats M.D.   On:  07/08/2024 21:19  PATIENT SURVEYS:  Modified Oswestry:  MODIFIED OSWESTRY DISABILITY SCALE  Date: 08/03/2024 Score                                Total 29/50; 58%   Interpretation of scores: Score  Category Description  0-20% Minimal Disability The patient can cope with most living activities. Usually no treatment is indicated apart from advice on lifting, sitting and exercise  21-40% Moderate Disability The patient experiences more pain and difficulty with sitting, lifting and standing. Travel and social life are more difficult and they may be disabled from work. Personal care, sexual activity and sleeping are not grossly affected, and the patient can usually be managed by conservative means  41-60% Severe Disability Pain remains the main problem in this group, but activities of daily living are affected. These patients require a detailed investigation  61-80% Crippled Back pain impinges on all aspects of the patient's life. Positive intervention is required  81-100% Bed-bound These patients are either bed-bound or exaggerating their symptoms  Bluford FORBES Zoe DELENA Karon DELENA, et al. Surgery versus conservative management of stable thoracolumbar fracture: the PRESTO feasibility RCT. [place] ([place]): [employer]; 2021 Nov. [facility] Technology Assessment, No. 25.62.) Appendix 3, Oswestry Disability Index category descriptors. Available from: Findjewelers.cz  Minimally Clinically Important Difference (MCID) = 12.8%  COGNITION: Overall cognitive status: Within functional limits for tasks assessed     SENSATION: Numbness and tingling into left leg  MUSCLE LENGTH: Hamstrings:  POSTURE: weight shift right  PALPATION: Tender T12 down to S1 paraspinals  LUMBAR ROM:   AROM eval  Flexion Fingertips to mid shin Pain down left leg  Extension 30% available pain in back  Right lateral flexion   Left lateral flexion   Right rotation   Left  rotation    (Blank rows = not tested)  LOWER EXTREMITY ROM:     Active  Right eval Left eval  Hip flexion    Hip extension    Hip abduction    Hip adduction    Hip internal rotation    Hip external rotation    Knee flexion    Knee extension    Ankle dorsiflexion    Ankle plantarflexion    Ankle inversion    Ankle eversion     (Blank rows = not tested)  LOWER EXTREMITY MMT:    MMT Right eval Left eval  Hip flexion 5 4-*  Hip extension 4+ 3-*  Hip abduction    Hip adduction    Hip internal rotation    Hip external rotation    Knee flexion 5 3-*  Knee extension 5 4*  Ankle dorsiflexion 5 4  Ankle plantarflexion    Ankle inversion    Ankle eversion     (Blank rows = not tested)  LUMBAR SPECIAL TESTS:  Trial of repeated flexion and extensions; prone press ups did not centralize pain  FUNCTIONAL TESTS:  5 times sit to stand: 18.47 (increased left leg pain, feels heavy)  GAIT: Distance walked: 50 ft in clinic Assistive device utilized: Single point cane and None Level of assistance: Modified independence Comments: antalgic gait   TREATMENT DATE: 08/03/2024 physical therapy evaluation and HEP instruction  PATIENT EDUCATION:  Education details: Patient educated on exam findings, POC, scope of PT, HEP, and what to expect next rx. Person educated: Patient Education method: Explanation, Demonstration, and Handouts Education comprehension: verbalized understanding, returned demonstration, verbal cues required, and tactile cues required  HOME EXERCISE PROGRAM: Abdominal bracing 08/03/24 (please make new HEP; did not save)  ASSESSMENT:  CLINICAL IMPRESSION: Patient is a 43 y.o. female who was seen today for physical therapy evaluation and treatment for M54.42 (ICD-10-CM) - Acute left-sided low back pain with left-sided sciatica.  Patient  demonstrates muscle weakness, reduced ROM, and fascial restrictions which are likely contributing to symptoms of pain and are negatively impacting patient ability to perform ADLs and functional mobility tasks. Patient will benefit from skilled physical therapy services to address these deficits to reduce pain and improve level of function with ADLs and functional mobility tasks.   OBJECTIVE IMPAIRMENTS: Abnormal gait, decreased activity tolerance, decreased ROM, decreased strength, increased fascial restrictions, impaired perceived functional ability, and pain.   ACTIVITY LIMITATIONS: carrying, lifting, bending, sitting, standing, squatting, locomotion level, and caring for others  PARTICIPATION LIMITATIONS: meal prep, cleaning, laundry, shopping, and community activity  REHAB POTENTIAL: Good  CLINICAL DECISION MAKING: Evolving/moderate complexity  EVALUATION COMPLEXITY: Moderate   GOALS: Goals reviewed with patient? No  SHORT TERM GOALS: Target date: 08/24/2024  patient will be independent with initial HEP and compliant with HEP 3-4 times a week   Baseline: Goal status: INITIAL  2.  Patient will report 50% improvement overall  Baseline:  Goal status: INITIAL    LONG TERM GOALS: Target date: 09/14/2024  Patient will be independent in self management strategies to improve quality of life and functional outcomes. Baseline:  Goal status: INITIAL  2.  Patient will report 70% improvement overall  Baseline:  Goal status: INITIAL  3.  Patient will improve Modified Oswestry score by 9 points to demonstrate improved perceived function  Baseline: 29/50 Goal status: INITIAL  4.   Patient will increase left leg MMT's to 4+ to 5/5 to allow navigation of steps without gait deviation or loss of balance  Baseline: see above Goal status: INITIAL  5.  Patient will be able to walk x 30 min to grocery shop without back or leg pain > 3/10 Baseline:  Goal status:  INITIAL  PLAN:  PT FREQUENCY: 1x/week  PT DURATION: 6 weeks  PLANNED INTERVENTIONS: 97164- PT Re-evaluation, 97110-Therapeutic exercises, 97530- Therapeutic activity, 97112- Neuromuscular re-education, 97535- Self Care, 02859- Manual therapy, [id number]- Gait training, [phone]- Orthotic Fit/training, [phone]- Canalith repositioning, [id number]- Aquatic Therapy, 97760- Splinting, [phone]- Wound care (first 20 sq cm), 97598- Wound care (each additional 20 sq cm)Patient/Family education, Balance training, Stair training, Taping, Dry Needling, Joint mobilization, Joint manipulation, Spinal manipulation, Spinal mobilization, Scar mobilization, and DME instructions.  SABRA  PLAN FOR NEXT SESSION: Review HEP and goals; try to centralize pain then core and postural strengthening.   10:23 AM, 08/13/24 Tregan Read Small James Senn MPT [facility] [place] [phone]

## 2024-08-03 ENCOUNTER — Encounter: Attending: Physical Medicine & Rehabilitation | Admitting: Physical Medicine & Rehabilitation

## 2024-08-03 ENCOUNTER — Encounter: Payer: Self-pay | Admitting: Physical Medicine & Rehabilitation

## 2024-08-03 ENCOUNTER — Other Ambulatory Visit: Payer: Self-pay

## 2024-08-03 ENCOUNTER — Ambulatory Visit (HOSPITAL_COMMUNITY)

## 2024-08-03 VITALS — BP 123/80 | HR 95 | Ht 65.0 in | Wt 174.0 lb

## 2024-08-03 DIAGNOSIS — M5442 Lumbago with sciatica, left side: Secondary | ICD-10-CM | POA: Insufficient documentation

## 2024-08-03 DIAGNOSIS — M5416 Radiculopathy, lumbar region: Secondary | ICD-10-CM

## 2024-08-03 DIAGNOSIS — M6281 Muscle weakness (generalized): Secondary | ICD-10-CM | POA: Insufficient documentation

## 2024-08-03 DIAGNOSIS — R262 Difficulty in walking, not elsewhere classified: Secondary | ICD-10-CM | POA: Insufficient documentation

## 2024-08-03 DIAGNOSIS — M545 Low back pain, unspecified: Secondary | ICD-10-CM

## 2024-08-03 MED ORDER — ACETAMINOPHEN-CODEINE 300-60 MG PO TABS: 1.0000 | ORAL_TABLET | ORAL | 1 refills | Status: AC | PRN

## 2024-08-03 NOTE — Progress Notes (Signed)
 Subjective:    Patient ID: Gabriella Schmidt, female    DOB: 06/06/1981, 43 y.o.   MRN: [id number]  HPI  Discussed the use of AI scribe software for clinical note transcription with the patient, who gave verbal consent to proceed.  History of Present Illness Gabriella Schmidt is a 43 year old female with degenerative disc disease at L5 S1 who presents with worsening leg pain and weakness.  She experiences significant weakness and pain in her left leg, necessitating the use of a cane. Some days, she is unable to support herself. She describes a new symptom where her foot turned purple while sitting with her leg elevated. No new bowel or bladder issues.  She has been undergoing physical therapy, which she started recently. She reports that her x-ray results mentioned L5 S1, which she was already aware of. She recalls being told her spine is 'nice and straight' and that there is osteoarthritis at L5 S1 with a narrowed disc space.  She experiences constant pain in her leg when walking and has difficulty standing still for more than thirty seconds due to pain. At night, she struggles to find a comfortable position and uses a heating pad for relief before sleeping. Her current medication regimen includes Norco and Tylenol , which she takes once a day, although she previously took it twice a day when her pain was more severe after a fall.  She recalls a sharp stabbing pain in her shin during a walk, which has persisted since. Her husband attempted to alleviate the pain with a massage gun, but it remains. No popping sensation at the time of the pain onset.  She quit smoking in 2014 and vaping before having her daughter, which she found more challenging to quit than smoking due to its convenience.   Pain Inventory Average Pain 8 Pain Right Now 8 My pain is constant, sharp, stabbing, tingling, and aching  In the last 24 hours, has pain interfered with the following? General activity 7 Relation with  others 5 Enjoyment of life 5 What TIME of day is your pain at its worst? morning , daytime, evening, and night Sleep (in general) Fair  Pain is worse with: walking, bending, sitting, inactivity, standing, and some activites Pain improves with: rest, medication, and heat Relief from Meds: 6  Family History  Problem Relation Age of Onset   Hypertension Mother    Skin cancer Mother    Polycystic ovary syndrome Mother    Hypertension Father    Cardiomyopathy Father    Lymphoma Father    Obesity Brother    Obesity Brother    Obesity Brother    Hypertension Maternal Grandmother    Hypertension Maternal Grandfather    Healthy Daughter    Social History   Socioeconomic History   Marital status: Married    Spouse name: Ozell Ferrera   Number of children: 1   Years of education: Not on file   Highest education level: Bachelor's degree (e.g., BA, AB, BS)  Occupational History   Not on file  Tobacco Use   Smoking status: Former    Current packs/day: 0.00    Average packs/day: 0.5 packs/day for 13.0 years (6.5 ttl pk-yrs)    Types: Cigarettes    Start date: 01/2000    Quit date: 01/2013    Years since quitting: 11.5    Passive exposure: Past   Smokeless tobacco: Never  Vaping Use   Vaping status: Former   Quit date: 09/20/2018  Substance  and Sexual Activity   Alcohol use: Not Currently   Drug use: Never   Sexual activity: Yes    Birth control/protection: Surgical    Comment: Hysterectomy  Other Topics Concern   Not on file  Social History Narrative   Not on file   Social Drivers of Health   Financial Resource Strain: Low Risk  (04/02/2024)   Overall Financial Resource Strain (CARDIA)    Difficulty of Paying Living Expenses: Not hard at all  Food Insecurity: No Food Insecurity (04/02/2024)   Hunger Vital Sign    Worried About Running Out of Food in the Last Year: Never true    Ran Out of Food in the Last Year: Never true  Transportation Needs: No Transportation Needs  (04/02/2024)   PRAPARE - Administrator, Civil Service (Medical): No    Lack of Transportation (Non-Medical): No  Physical Activity: Sufficiently Active (04/02/2024)   Exercise Vital Sign    Days of Exercise per Week: 3 days    Minutes of Exercise per Session: 60 min  Stress: Stress Concern Present (04/02/2024)   [employer] of Occupational Health - Occupational Stress Questionnaire    Feeling of Stress: Rather much  Social Connections: Moderately Integrated (04/02/2024)   Social Connection and Isolation Panel    Frequency of Communication with Friends and Family: More than three times a week    Frequency of Social Gatherings with Friends and Family: More than three times a week    Attends Religious Services: More than 4 times per year    Active Member of [employer] or Organizations: No    Attends Banker Meetings: Not on file    Marital Status: Married   Past Surgical History:  Procedure Laterality Date   COLONOSCOPY N/A 02/28/2024   Procedure: COLONOSCOPY;  Surgeon: Cindie Carlin POUR, DO;  [facility];  Service: Endoscopy;  Laterality: N/A;  130pm, asa 2   ROBOTIC ASSISTED LAPAROSCOPIC HYSTERECTOMY AND SALPINGECTOMY Bilateral 05/17/2023   Procedure: XI ROBOTIC ASSISTED LAPAROSCOPIC HYSTERECTOMY AND SALPINGECTOMY;  Surgeon: Marilynn Nest, DO;  Location: [facility];  Service: Gynecology;  Laterality: Bilateral;   TOOTH EXTRACTION     TUBAL LIGATION N/A 06/29/2020   Procedure: POST PARTUM TUBAL LIGATION;  Surgeon: Herchel Gloris LABOR, MD;  [facility];  Service: Gynecology;  Laterality: N/A;   Past Surgical History:  Procedure Laterality Date   COLONOSCOPY N/A 02/28/2024   Procedure: COLONOSCOPY;  Surgeon: Cindie Carlin POUR, DO;  [facility];  Service: Endoscopy;  Laterality: N/A;  130pm, asa 2   ROBOTIC ASSISTED LAPAROSCOPIC HYSTERECTOMY AND SALPINGECTOMY Bilateral 05/17/2023   Procedure: XI ROBOTIC ASSISTED LAPAROSCOPIC HYSTERECTOMY AND  SALPINGECTOMY;  Surgeon: Marilynn Nest, DO;  Location: [facility];  Service: Gynecology;  Laterality: Bilateral;   TOOTH EXTRACTION     TUBAL LIGATION N/A 06/29/2020   Procedure: POST PARTUM TUBAL LIGATION;  Surgeon: Herchel Gloris LABOR, MD;  [facility];  Service: Gynecology;  Laterality: N/A;   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   Hypertension    IBS (irritable bowel syndrome) 02/2024   Pre-diabetes    Ht 5' 5 (1.651 m)   Wt 174 lb (78.9  kg)   LMP 09/20/2022 (Approximate)   BMI 28.96 kg/m   Opioid Risk Score:   Fall Risk Score:  `1  Depression screen [facility] 2/9     08/03/2024   11:53 AM 04/06/2024    8:44 AM 12/09/2023   11:37 AM 10/25/2023    2:50 PM 06/21/2023   12:20 PM 06/07/2023    2:31 PM 03/22/2023    9:40 AM  Depression screen PHQ 2/9  Decreased Interest 1 0 0 0 0 0 1  Down, Depressed, Hopeless  0 0 0 0 0 1  PHQ - 2 Score 1 0 0 0 0 0 2    Review of Systems  Musculoskeletal:        Left leg pain, left hip  All other systems reviewed and are negative.      Objective:   Physical Exam General No acute distress Mood affect appropriate Extremities without edema Skin without discoloration of the lower extremities Feet are warm bilaterally No skin lesions. Negative straight leg raise bilaterally There is reduced sensation left L5 greater than L4 greater than L3 dermatomal distribution Deep tendon reflexes are 2+ bilateral knees and ankles Motor strength is 5/5 bilateral hip flexor knee extensor ankle dorsiflexor. Ambulates without assistive device no evidence of toe drag or knee instability       Assessment & Plan:  Assessment and Plan Assessment & Plan Left lumbar radiculopathy with sciatica due to lumbar degenerative disc disease at  L5-S1 Chronic left lumbar radiculopathy with sciatica due to degenerative disc disease at L5-S1. Symptoms include pain, weakness, and difficulty standing or walking. X-ray shows L5-S1 narrowing. MRI from 2022 shows mild disc bulge. Current symptoms suggest possible progression. Differential includes possible calf tear. No new bowel or bladder issues. Sensory changes in left leg. Current pain management with Tylenol  4 once daily. Physical therapy ongoing. - Ordered MRI at [facility] to assess L5-S1 HNP vs stenosis, has signs of Left L5 radic. - Increased Tylenol  4 to twice daily, 60 tablets. - Continue physical therapy for six weeks. - Consider epidural steroid injection if MRI shows significant changes with nerve root impingement correlating to patient's symptoms - Scheduled follow-up in one month. She is already on diclofenac  50 mg twice daily prescribed by rheumatology

## 2024-08-03 NOTE — Patient Instructions (Signed)
  VISIT SUMMARY: You visited us  today due to worsening leg pain and weakness related to your degenerative disc disease at L5-S1. We discussed your symptoms, current treatments, and next steps for managing your condition.  YOUR PLAN: LEFT LUMBAR RADICULOPATHY WITH SCIATICA DUE TO LUMBAR DEGENERATIVE DISC DISEASE AT L5-S1: You have chronic pain and weakness in your left leg due to degenerative disc disease at L5-S1. Your symptoms include difficulty standing or walking, and sensory changes in your left leg. -We have ordered an MRI at [facility] to assess the condition of your L5-S1 disc. -Increase your Tylenol  4 to twice daily. You will receive 60 tablets. -Continue with physical therapy for the next six weeks. -If the MRI shows significant changes, we may consider an epidural steroid injection. -Please schedule a follow-up appointment in one month.                      Contains text generated by Abridge.                                 Contains text generated by Abridge.

## 2024-08-08 ENCOUNTER — Ambulatory Visit (HOSPITAL_COMMUNITY)
Admission: RE | Admit: 2024-08-08 | Discharge: 2024-08-08 | Disposition: A | Discharge status: Home / Self Care | Source: Ambulatory Visit | Attending: Physical Medicine & Rehabilitation | Admitting: Physical Medicine & Rehabilitation

## 2024-08-08 DIAGNOSIS — M5442 Lumbago with sciatica, left side: Secondary | ICD-10-CM | POA: Diagnosis present

## 2024-08-09 ENCOUNTER — Ambulatory Visit: Admitting: Family Medicine

## 2024-08-09 ENCOUNTER — Encounter: Payer: Self-pay | Admitting: Family Medicine

## 2024-08-09 VITALS — BP 119/75 | HR 79 | Ht 65.0 in | Wt 174.0 lb

## 2024-08-09 DIAGNOSIS — I10 Essential (primary) hypertension: Secondary | ICD-10-CM | POA: Diagnosis not present

## 2024-08-09 DIAGNOSIS — E663 Overweight: Secondary | ICD-10-CM | POA: Diagnosis not present

## 2024-08-09 MED ORDER — PHENTERMINE HCL 15 MG PO CAPS: 15.0000 mg | ORAL_CAPSULE | ORAL | 0 refills | Status: DC

## 2024-08-09 NOTE — Progress Notes (Signed)
 Established Patient Office Visit  Subjective:  Patient ID: Gabriella Schmidt, female    DOB: [birth year]  Age: 43 y.o. MRN: [id number]  CC:  Chief Complaint  Patient presents with   Obesity    Difficulty losing weight     HPI Gabriella Schmidt is a 43 y.o. female with past medical history of HTN, Asthma, Hyperlipidemia and overweight presents for f/u of  chronic medical conditions.  For the details of today's visit, please refer to the assessment and plan.     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   Hypertension    IBS (irritable bowel syndrome) 02/2024   Pre-diabetes     Past Surgical History:  Procedure Laterality Date   COLONOSCOPY N/A 02/28/2024   Procedure: COLONOSCOPY;  Surgeon: Cindie Carlin POUR, DO;  Location: [facility];  Service: Endoscopy;  Laterality: N/A;  130pm, asa 2   ROBOTIC ASSISTED LAPAROSCOPIC HYSTERECTOMY AND SALPINGECTOMY Bilateral 05/17/2023   Procedure: XI ROBOTIC ASSISTED LAPAROSCOPIC HYSTERECTOMY AND SALPINGECTOMY;  Surgeon: Marilynn Nest, DO;  Location: [facility];  Service: Gynecology;  Laterality: Bilateral;   TOOTH EXTRACTION     TUBAL LIGATION N/A 06/29/2020   Procedure: POST PARTUM TUBAL LIGATION;  Surgeon: Herchel Gloris LABOR, MD;  Location: [facility];  Service: Gynecology;  Laterality: N/A;    Family History  Problem Relation Age of Onset   Hypertension Mother    Skin cancer Mother    Polycystic ovary syndrome Mother    Hypertension Father    Cardiomyopathy Father    Lymphoma Father    Obesity Brother    Obesity Brother    Obesity Brother    Hypertension Maternal Grandmother    Hypertension Maternal Grandfather    Healthy Daughter     Social History    Socioeconomic History   Marital status: Married    Spouse name: Gabriella Schmidt   Number of children: 1   Years of education: Not on file   Highest education level: Bachelor's degree (e.g., BA, AB, BS)  Occupational History   Not on file  Tobacco Use   Smoking status: Former    Current packs/day: 0.00    Average packs/day: 0.5 packs/day for 13.0 years (6.5 ttl pk-yrs)    Types: Cigarettes    Start date: 01/2000    Quit date: 01/2013    Years since quitting: 11.5    Passive exposure: Past   Smokeless tobacco: Never  Vaping Use   Vaping status: Former   Quit date: 09/20/2018  Substance and Sexual Activity   Alcohol use: Not Currently   Drug use: Never   Sexual activity: Yes    Birth control/protection: Surgical    Comment: Hysterectomy  Other Topics Concern   Not on file  Social History Narrative   Not on file   Social Drivers of Health   Financial Resource Strain: Low Risk  (08/05/2024)   Overall Financial Resource Strain (CARDIA)    Difficulty of Paying Living Expenses: Not hard at all  Food Insecurity: No Food Insecurity (08/05/2024)   Hunger Vital Sign    Worried About Running Out of Food in the Last Year: Never true    Ran Out of Food in  the Last Year: Never true  Transportation Needs: No Transportation Needs (08/05/2024)   PRAPARE - Administrator, Civil Service (Medical): No    Lack of Transportation (Non-Medical): No  Physical Activity: Inactive (08/05/2024)   Exercise Vital Sign    Days of Exercise per Week: 0 days    Minutes of Exercise per Session: Not on file  Stress: No Stress Concern Present (08/05/2024)   [employer] of Occupational Health - Occupational Stress Questionnaire    Feeling of Stress: Only a little  Social Connections: Socially Integrated (08/05/2024)   Social Connection and Isolation Panel    Frequency of Communication with Friends and Family: More than three times a week    Frequency of Social Gatherings with Friends  and Family: Twice a week    Attends Religious Services: More than 4 times per year    Active Member of [employer] or Organizations: Yes    Attends Engineer, Structural: More than 4 times per year    Marital Status: Married  Catering Manager Violence: Not At Risk (12/14/2022)   Humiliation, Afraid, Rape, and Kick questionnaire    Fear of Current or Ex-Partner: No    Emotionally Abused: No    Physically Abused: No    Sexually Abused: No    Outpatient Medications Prior to Visit  Medication Sig Dispense Refill   acetaminophen -codeine  (TYLENOL  #4) 300-60 MG tablet Take 1 tablet by mouth daily. 30 tablet 1   acetaminophen -codeine  (TYLENOL  #4) 300-60 MG tablet Take 1 tablet by mouth every 4 (four) hours as needed for moderate pain (pain score 4-6). 60 tablet 1   albuterol  (VENTOLIN  HFA) 108 (90 Base) MCG/ACT inhaler Inhale 2 puffs into the lungs every 6 (six) hours as needed for wheezing or shortness of breath. 8 g 2   cyclobenzaprine  (FLEXERIL ) 5 MG tablet Take 1 tablet (5 mg total) by mouth 3 (three) times daily as needed for muscle spasms. 90 tablet 0   diclofenac  (VOLTAREN ) 50 MG EC tablet TAKE 1 TABLET(50 MG) BY MOUTH TWICE DAILY 60 tablet 2   dicyclomine  (BENTYL ) 10 MG capsule Take 1 capsule (10 mg total) by mouth in the morning, at noon, in the evening, and at bedtime. 120 capsule 11   gabapentin  (NEURONTIN ) 400 MG capsule TAKE 1 CAPSULE(400 MG) BY MOUTH TWICE DAILY 60 capsule 2   linaclotide  (LINZESS ) 145 MCG CAPS capsule Take 1 capsule (145 mcg total) by mouth daily before breakfast. 90 capsule 3   Multiple Vitamin (MULTIVITAMIN) tablet Take 1 tablet by mouth daily.     Secukinumab , 300 MG Dose, (COSENTYX  SENSOREADY, 300 MG,) 150 MG/ML SOAJ Inject 2 mLs (300 mg total) into the skin every 28 (twenty-eight) days. 2 mL 2   sertraline  (ZOLOFT ) 100 MG tablet TAKE 2 TABLETS(200 MG) BY MOUTH DAILY 180 tablet 1   traZODone  (DESYREL ) 150 MG tablet TAKE 1 TABLET(150 MG) BY MOUTH AT BEDTIME AS  NEEDED FOR SLEEP 90 tablet 0   traZODone  (DESYREL ) 50 MG tablet TAKE 1 TABLET(50 MG) BY MOUTH AT BEDTIME 90 tablet 0   Vitamin D , Ergocalciferol , (DRISDOL ) 1.25 MG (50000 UNIT) CAPS capsule Take 1 capsule (50,000 Units total) by mouth every 7 (seven) days. 27 capsule 2   No facility-administered medications prior to visit.    Allergies  Allergen Reactions   Latex Rash    ROS Review of Systems  Constitutional:  Negative for chills and fever.  Eyes:  Negative for visual disturbance.  Respiratory:  Negative for chest tightness  and shortness of breath.   Neurological:  Negative for dizziness and headaches.      Objective:    Physical Exam Constitutional:      Appearance: She is obese.  HENT:     Head: Normocephalic.     Mouth/Throat:     Mouth: Mucous membranes are moist.  Cardiovascular:     Rate and Rhythm: Normal rate.     Heart sounds: Normal heart sounds.  Pulmonary:     Effort: Pulmonary effort is normal.     Breath sounds: Normal breath sounds.  Neurological:     Mental Status: She is alert.     BP 119/75   Pulse 79   Ht 5' 5 (1.651 m)   Wt 174 lb (78.9 kg)   LMP 09/20/2022 (Approximate)   SpO2 97%   BMI 28.96 kg/m  Wt Readings from Last 3 Encounters:  08/09/24 174 lb (78.9 kg)  08/03/24 174 lb (78.9 kg)  07/20/24 174 lb 9.6 oz (79.2 kg)    Lab Results  Component Value Date   TSH 0.973 04/03/2024   Lab Results  Component Value Date   WBC 6.8 07/20/2024   HGB 12.1 07/20/2024   HCT 38.1 07/20/2024   MCV 87.6 07/20/2024   PLT 321 07/20/2024   Lab Results  Component Value Date   NA 139 07/20/2024   K 3.9 07/20/2024   CO2 28 07/20/2024   GLUCOSE 90 07/20/2024   BUN 11 07/20/2024   CREATININE 0.68 07/20/2024   BILITOT 0.6 07/20/2024   ALKPHOS 84 04/03/2024   AST 21 07/20/2024   ALT 25 07/20/2024   PROT 7.3 07/20/2024   ALBUMIN 4.2 04/03/2024   CALCIUM 8.5 (L) 07/20/2024   ANIONGAP 10 05/13/2023   EGFR 113 07/20/2024   Lab Results   Component Value Date   CHOL 202 (H) 04/03/2024   Lab Results  Component Value Date   HDL 43 04/03/2024   Lab Results  Component Value Date   LDLCALC 128 (H) 04/03/2024   Lab Results  Component Value Date   TRIG 175 (H) 04/03/2024   Lab Results  Component Value Date   CHOLHDL 4.7 (H) 04/03/2024   Lab Results  Component Value Date   HGBA1C 5.6 04/03/2024      Assessment & Plan:  Overweight (BMI 25.0-29.9) Assessment & Plan: The patient reports difficulty losing weight despite the lifestyle changes and efforts she has implemented. We discussed available weight loss options based on her insurance coverage. Will initiate treatment with phentermine 15 mg daily for weight management.  Encouraged to monitor for potential side effects, including elevated blood pressure, palpitations or rapid heart rate, insomnia, dry mouth, anxiety or restlessness, headaches, and constipation. Contact the clinic immediately if you experience severe symptoms such as chest pain, shortness of breath, severe dizziness, or Schmidt irregular heartbeat. For optimal results with weight loss, I recommend:  Decreasing portion sizes. Reducing sugar, sodium, and carbohydrate intake, and limiting saturated fats in your diet. Increasing your fiber intake by incorporating more whole grains, fruits, and vegetables. Setting healthy goals and focusing on lowering carbs, sugar, and fat. Increasing the variety of fruits and vegetables in your diet. Reducing soda consumption and limiting processed foods. In addition to taking your weight loss medication, engage in moderate-intensity physical activity for at least 150 minutes per week for the best results.   Orders: -     Phentermine HCl; Take 1 capsule (15 mg total) by mouth every morning.  Dispense: 30 capsule; Refill:  0  Chronic hypertension Assessment & Plan: Controlled without medication A low-sodium diet of less than 2300 mg daily is recommended, along with  increased physical activity of moderate intensity, aiming for 150 minutes weekly. The patient is encouraged to continue with these lifestyle modifications to help manage their blood pressure effectively.  BP Readings from Last 3 Encounters:  08/09/24 119/75  08/03/24 123/80  07/20/24 126/82       Note: This chart has been completed using Dragon Medical Dictation software, and while attempts have been made to ensure accuracy, certain words and phrases may not be transcribed as intended.   Follow-up: Return in about 4 months (around 12/07/2024).   Angle Karel  Z Bacchus, FNP

## 2024-08-09 NOTE — Assessment & Plan Note (Signed)
 The patient reports difficulty losing weight despite the lifestyle changes and efforts she has implemented. We discussed available weight loss options based on her insurance coverage. Will initiate treatment with phentermine 15 mg daily for weight management.  Encouraged to monitor for potential side effects, including elevated blood pressure, palpitations or rapid heart rate, insomnia, dry mouth, anxiety or restlessness, headaches, and constipation. Contact the clinic immediately if you experience severe symptoms such as chest pain, shortness of breath, severe dizziness, or an irregular heartbeat. For optimal results with weight loss, I recommend:  Decreasing portion sizes. Reducing sugar, sodium, and carbohydrate intake, and limiting saturated fats in your diet. Increasing your fiber intake by incorporating more whole grains, fruits, and vegetables. Setting healthy goals and focusing on lowering carbs, sugar, and fat. Increasing the variety of fruits and vegetables in your diet. Reducing soda consumption and limiting processed foods. In addition to taking your weight loss medication, engage in moderate-intensity physical activity for at least 150 minutes per week for the best results.

## 2024-08-09 NOTE — Patient Instructions (Addendum)
 I appreciate the opportunity to provide care to you today!    Follow up:  4 months /1 month weight check  Labs: please stop by the lab today to get your blood drawn (CBC, CMP, TSH, Lipid profile, HgA1c, Vit D)  Start taking phentermine 15 mg daily. Please monitor for potential side effects, including elevated blood pressure, palpitations or rapid heart rate, insomnia, dry mouth, anxiety or restlessness, headaches, and constipation. Contact the clinic immediately if you experience severe symptoms such as chest pain, shortness of breath, severe dizziness, or an irregular heartbeat. For optimal results with weight loss, I recommend:  Decreasing portion sizes. Reducing sugar, sodium, and carbohydrate intake, and limiting saturated fats in your diet. Increasing your fiber intake by incorporating more whole grains, fruits, and vegetables. Setting healthy goals and focusing on lowering carbs, sugar, and fat. Increasing the variety of fruits and vegetables in your diet. Reducing soda consumption and limiting processed foods. In addition to taking your weight loss medication, engage in moderate-intensity physical activity for at least 150 minutes per week for the best results.    Please follow up if your symptoms worsen or fail to improve.   Please continue to a heart-healthy diet and increase your physical activities. Try to exercise for at least five days a week.    It was a pleasure to see you and I look forward to continuing to work together on your health and well-being. Please do not hesitate to call the office if you need care or have questions about your care.  In case of emergency, please visit the Emergency Department for urgent care, or contact our clinic at [phone] to schedule an appointment. We're here to help you!   Have a wonderful day and week. With Gratitude, Meade JENEANE Gerlach MSN, FNP-BC, PMHNP-BC

## 2024-08-09 NOTE — Assessment & Plan Note (Signed)
 Controlled without medication A low-sodium diet of less than 2300 mg daily is recommended, along with increased physical activity of moderate intensity, aiming for 150 minutes weekly. The patient is encouraged to continue with these lifestyle modifications to help manage their blood pressure effectively.  BP Readings from Last 3 Encounters:  08/09/24 119/75  08/03/24 123/80  07/20/24 126/82

## 2024-08-10 LAB — CMP14+EGFR
ALT: 26 IU/L (ref 0–32)
AST: 23 IU/L (ref 0–40)
Albumin: 4.1 g/dL (ref 3.9–4.9)
Alkaline Phosphatase: 89 IU/L (ref 41–116)
BUN/Creatinine Ratio: 14 (ref 9–23)
BUN: 10 mg/dL (ref 6–24)
Bilirubin Total: 0.5 mg/dL (ref 0.0–1.2)
CO2: 23 mmol/L (ref 20–29)
Calcium: 8.7 mg/dL (ref 8.7–10.2)
Chloride: 102 mmol/L (ref 96–106)
Creatinine, Ser: 0.73 mg/dL (ref 0.57–1.00)
Globulin, Total: 2.9 g/dL (ref 1.5–4.5)
Glucose: 92 mg/dL (ref 70–99)
Potassium: 4.4 mmol/L (ref 3.5–5.2)
Sodium: 140 mmol/L (ref 134–144)
Total Protein: 7 g/dL (ref 6.0–8.5)
eGFR: 107 mL/min/1.73 (ref >59)

## 2024-08-10 LAB — CBC WITH DIFFERENTIAL/PLATELET
Basophils Absolute: 0.1 x10E3/uL (ref 0.0–0.2)
Basos: 1 %
EOS (ABSOLUTE): 0.1 x10E3/uL (ref 0.0–0.4)
Eos: 2 %
Hematocrit: 39.9 % (ref 34.0–46.6)
Hemoglobin: 12.6 g/dL (ref 11.1–15.9)
Immature Grans (Abs): 0 x10E3/uL (ref 0.0–0.1)
Immature Granulocytes: 0 %
Lymphocytes Absolute: 2 x10E3/uL (ref 0.7–3.1)
Lymphs: 35 %
MCH: 27.7 pg (ref 26.6–33.0)
MCHC: 31.6 g/dL (ref 31.5–35.7)
MCV: 88 fL (ref 79–97)
Monocytes Absolute: 0.4 x10E3/uL (ref 0.1–0.9)
Monocytes: 6 %
Neutrophils Absolute: 3.2 x10E3/uL (ref 1.4–7.0)
Neutrophils: 56 %
Platelets: 333 x10E3/uL (ref 150–450)
RBC: 4.55 x10E6/uL (ref 3.77–5.28)
RDW: 12.9 % (ref 11.7–15.4)
WBC: 5.8 x10E3/uL (ref 3.4–10.8)

## 2024-08-10 LAB — LIPID PANEL
Chol/HDL Ratio: 4.4 ratio (ref 0.0–4.4)
Cholesterol, Total: 206 mg/dL — ABNORMAL HIGH (ref 100–199)
HDL: 47 mg/dL (ref >39)
LDL Chol Calc (NIH): 129 mg/dL — ABNORMAL HIGH (ref 0–99)
Triglycerides: 170 mg/dL — ABNORMAL HIGH (ref 0–149)
VLDL Cholesterol Cal: 30 mg/dL (ref 5–40)

## 2024-08-10 LAB — HEMOGLOBIN A1C
Est. average glucose Bld gHb Est-mCnc: 111 mg/dL
Hgb A1c MFr Bld: 5.5 % (ref 4.8–5.6)

## 2024-08-10 LAB — TSH+FREE T4
Free T4: 0.92 ng/dL (ref 0.82–1.77)
TSH: 1.62 u[IU]/mL (ref 0.450–4.500)

## 2024-08-10 LAB — VITAMIN D 25 HYDROXY (VIT D DEFICIENCY, FRACTURES): Vit D, 25-Hydroxy: 46.9 ng/mL (ref 30.0–100.0)

## 2024-08-15 ENCOUNTER — Other Ambulatory Visit: Payer: Self-pay | Admitting: Family Medicine

## 2024-08-15 DIAGNOSIS — G4700 Insomnia, unspecified: Secondary | ICD-10-CM

## 2024-08-18 ENCOUNTER — Other Ambulatory Visit: Payer: Self-pay | Admitting: Physical Medicine & Rehabilitation

## 2024-08-22 ENCOUNTER — Ambulatory Visit (HOSPITAL_COMMUNITY)

## 2024-08-22 ENCOUNTER — Telehealth (HOSPITAL_COMMUNITY): Payer: Self-pay

## 2024-08-22 ENCOUNTER — Encounter (HOSPITAL_COMMUNITY): Payer: Self-pay

## 2024-08-22 NOTE — Telephone Encounter (Signed)
 No show, called and spoke to pt who stated she was on way and stuck in traffic which would allow 10 minutes for the session. Reminded next apt date and time wiht contact information if unable to make it to future apts.   Augustin Mclean, LPTA/CLT; WILLAIM [phone]

## 2024-08-23 ENCOUNTER — Encounter: Payer: Self-pay | Admitting: Family Medicine

## 2024-08-23 ENCOUNTER — Other Ambulatory Visit: Payer: Self-pay | Admitting: Family Medicine

## 2024-08-23 DIAGNOSIS — E663 Overweight: Secondary | ICD-10-CM

## 2024-08-23 MED ORDER — PHENTERMINE HCL 15 MG PO CAPS: 15.0000 mg | ORAL_CAPSULE | ORAL | 0 refills | Status: AC

## 2024-08-23 NOTE — Telephone Encounter (Signed)
 Rx sent.

## 2024-08-25 ENCOUNTER — Encounter: Payer: Self-pay | Admitting: Physical Medicine & Rehabilitation

## 2024-08-29 ENCOUNTER — Encounter (HOSPITAL_COMMUNITY): Payer: Self-pay

## 2024-08-29 ENCOUNTER — Ambulatory Visit (HOSPITAL_COMMUNITY)

## 2024-08-29 DIAGNOSIS — R262 Difficulty in walking, not elsewhere classified: Secondary | ICD-10-CM | POA: Insufficient documentation

## 2024-08-29 DIAGNOSIS — M6281 Muscle weakness (generalized): Secondary | ICD-10-CM | POA: Diagnosis present

## 2024-08-29 DIAGNOSIS — M5416 Radiculopathy, lumbar region: Secondary | ICD-10-CM

## 2024-08-29 DIAGNOSIS — M545 Low back pain, unspecified: Secondary | ICD-10-CM

## 2024-08-29 NOTE — Therapy (Signed)
 OUTPATIENT PHYSICAL THERAPY THORACOLUMBAR TREATMENT   Patient Name: Gabriella Schmidt MRN: [id number] DOB:[birth year], 43 y.o., female Today's Date: 08/29/2024  END OF SESSION:  PT End of Session - 08/29/24 0739     Visit Number 2    Number of Visits 6    Date for Recertification  09/14/24    Authorization Type [facility]    Authorization Time Period No auth; no visit limit    PT Start Time 0739   Late sign in   PT Stop Time 0814    PT Time Calculation (min) 35 min    Activity Tolerance Patient tolerated treatment well;Patient limited by pain    Behavior During Therapy [facility] for tasks assessed/performed           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   Hypertension    IBS (irritable bowel syndrome) 02/2024   Pre-diabetes    Past Surgical History:  Procedure Laterality Date   COLONOSCOPY N/A 02/28/2024   Procedure: COLONOSCOPY;  Surgeon: Cindie Carlin POUR, DO;  Location: [facility];  Service: Endoscopy;  Laterality: N/A;  130pm, asa 2   ROBOTIC ASSISTED LAPAROSCOPIC HYSTERECTOMY AND SALPINGECTOMY Bilateral 05/17/2023   Procedure: XI ROBOTIC ASSISTED LAPAROSCOPIC HYSTERECTOMY AND SALPINGECTOMY;  Surgeon: Marilynn Nest, DO;  Location: [facility] ORS;  Service: Gynecology;  Laterality: Bilateral;   TOOTH EXTRACTION     TUBAL LIGATION N/A 06/29/2020   Procedure: POST PARTUM TUBAL LIGATION;  Surgeon: Herchel Gloris LABOR, MD;  Location: [facility] LD ORS;  Service: Gynecology;  Laterality: N/A;   Patient Active Problem List   Diagnosis Date Noted   Sleep disturbance 04/08/2024   Anxiety 04/08/2024   Dyspareunia in female 12/30/2023   S/P hysterectomy 12/30/2023   Left lower quadrant abdominal pain 12/30/2023   Patellofemoral pain syndrome 08/30/2023   Right foot pain  06/10/2023   Dysmenorrhea 05/17/2023   Trochanteric bursitis of both hips 03/22/2023   Encounter for gynecological examination with Papanicolaou smear of cervix 12/14/2022   Hyperglycemia 10/19/2022   High risk medication use 09/28/2022   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/2022   Bilateral sacroiliitis 07/27/2022   CRP elevated 07/27/2022   Positive ANA (antinuclear antibody) 07/27/2022   Pain of both breasts 07/07/2022   Pregnancy examination or test, negative result 07/07/2022   Prolonged periods 07/07/2022   Menorrhagia with regular cycle 03/31/2022   Uterine cramping 03/31/2022   Dyspareunia, female 03/17/2022   Urinary incontinence 03/17/2022   Hematuria 03/17/2022   Overweight (BMI 25.0-29.9) 01/05/2022   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   School health examination 12/10/2021   Weight gain 11/23/2021   Fatigue 11/23/2021   Nausea 11/23/2021   Breast tenderness 11/23/2021   Mass of upper outer quadrant of left breast 11/23/2021   Mass of lower inner quadrant of right breast 11/23/2021   Proteinuria 11/23/2021   Urinary frequency 11/23/2021   Pregnancy test negative 11/23/2021   Anemia 09/02/2021   COVID-19 04/17/2021   Insomnia 01/14/2021   Back pain 01/14/2021   Liver function test abnormality 10/06/2020   Hyperlipidemia 10/06/2020   Dizziness 10/06/2020   Degenerative disc disease at L5-S1 level 10/06/2020   Asthma 09/01/2020   Chronic hypertension 01/31/2020  Depression with anxiety 01/31/2020    PCP: Edman Meade PEDLAR, FNP  REFERRING PROVIDER: Carilyn Prentice BRAVO, MD  REFERRING DIAG: [phone] (ICD-10-CM) - Acute left-sided low back pain with left-sided sciatica  Rationale for Evaluation and Treatment: Rehabilitation  THERAPY DIAG:  Low back pain, unspecified back pain laterality, unspecified chronicity, unspecified whether sciatica present  Muscle weakness (generalized)  Radiculopathy, lumbar  region  Difficulty in walking, not elsewhere classified  ONSET DATE: 2 months ago  SUBJECTIVE:                                                                                                                                                                                           SUBJECTIVE STATEMENT: 08/29/24:  Plans to graduate Friday, doesn't feel she will be able to walk.  Pain scale 9/10 Lt Lower back with radicular symptoms down to Lt ankle, stabbing pain.  Eval:  Back pain started about 2 months ago; tripped and fell at church; landed on her left knee.  Initially could not stand up straight for about a week; now its going into her left hip and down to knee and ankle; Not getting any better; also sees MD today; standing is the worst for her leg pain.  Left leg sometimes feels like it gives way  PERTINENT HISTORY:  History of chronic back pain prior to fall  PAIN:  Are you having pain? Yes: NPRS scale: 9/10, best 4-5/10 Pain location: low back and down left leg Pain description: aching in back Aggravating factors: standing prolonged, walking Relieving factors: rest, medication  PRECAUTIONS: None  RED FLAGS: None   WEIGHT BEARING RESTRICTIONS: No  FALLS:  Has patient fallen in last 6 months? Yes. 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    PLOF: Independent  PATIENT GOALS: to be able to stand in one place without leg pain  NEXT MD VISIT: 09/04/24  OBJECTIVE:  Note: Objective measures were completed at Evaluation unless otherwise noted.  DIAGNOSTIC FINDINGS:  CLINICAL DATA:  Left-sided low back pain.   EXAM: LUMBAR SPINE - 2-3 VIEW   COMPARISON:  CT abdomen pelvis 02/02/2024   FINDINGS: Normal anatomic alignment. No evidence for acute fracture or dislocation. L5-S1 degenerative disc disease. Preservation of the vertebral body heights. SI joints unremarkable.   IMPRESSION: L5-S1 degenerative disc disease.     Electronically  Signed   By: Bard Moats M.D.   On: 07/08/2024 21:19  PATIENT SURVEYS:  Modified Oswestry:  MODIFIED OSWESTRY DISABILITY SCALE  Date: 08/03/2024 Score  Total 29/50; 58%   Interpretation of scores: Score Category Description  0-20% Minimal Disability The patient can cope with most living activities. Usually no treatment is indicated apart from advice on lifting, sitting and exercise  21-40% Moderate Disability The patient experiences more pain and difficulty with sitting, lifting and standing. Travel and social life are more difficult and they may be disabled from work. Personal care, sexual activity and sleeping are not grossly affected, and the patient can usually be managed by conservative means  41-60% Severe Disability Pain remains the main problem in this group, but activities of daily living are affected. These patients require a detailed investigation  61-80% Crippled Back pain impinges on all aspects of the patients life. Positive intervention is required  81-100% Bed-bound These patients are either bed-bound or exaggerating their symptoms  Bluford FORBES Zoe DELENA Karon DELENA, et al. Surgery versus conservative management of stable thoracolumbar fracture: the PRESTO feasibility RCT. [place] ([place]): [employer]; 2021 Nov. [facility] Technology Assessment, No. 25.62.) Appendix 3, Oswestry Disability Index category descriptors. Available from: Findjewelers.cz  Minimally Clinically Important Difference (MCID) = 12.8%  COGNITION: Overall cognitive status: Within functional limits for tasks assessed     SENSATION: Numbness and tingling into left leg  MUSCLE LENGTH: Hamstrings:  POSTURE: weight shift right  PALPATION: Tender T12 down to S1 paraspinals  LUMBAR ROM:   AROM eval  Flexion Fingertips to mid shin Pain down left leg  Extension 30% available pain in back  Right lateral flexion   Left lateral  flexion   Right rotation   Left rotation    (Blank rows = not tested)  LOWER EXTREMITY ROM:     Active  Right eval Left eval  Hip flexion    Hip extension    Hip abduction    Hip adduction    Hip internal rotation    Hip external rotation    Knee flexion    Knee extension    Ankle dorsiflexion    Ankle plantarflexion    Ankle inversion    Ankle eversion     (Blank rows = not tested)  LOWER EXTREMITY MMT:    MMT Right eval Left eval  Hip flexion 5 4-*  Hip extension 4+ 3-*  Hip abduction    Hip adduction    Hip internal rotation    Hip external rotation    Knee flexion 5 3-*  Knee extension 5 4*  Ankle dorsiflexion 5 4  Ankle plantarflexion    Ankle inversion    Ankle eversion     (Blank rows = not tested)  LUMBAR SPECIAL TESTS:  Trial of repeated flexion and extensions; prone press ups did not centralize pain  FUNCTIONAL TESTS:  5 times sit to stand: 18.47 (increased left leg pain, feels heavy)  GAIT: Distance walked: 50 ft in clinic Assistive device utilized: Single point cane and None Level of assistance: Modified independence Comments: antalgic gait   TREATMENT DATE:  08/29/24: Reviewed goals Educated importance of HEP complaince for maximal benefits  Checked SI alignment- noted Lt SI anterior rotation MET  Pubic clearance (Isometric adduction into ball then abd into belt) 10x 5 Bridge (partial raise)  with ab set (~240) DKTC 2x 30  08/03/2024 physical therapy evaluation and HEP instruction  PATIENT EDUCATION:  Education details: Patient educated on exam findings, POC, scope of PT, HEP, and what to expect next rx. Person educated: Patient Education method: Explanation, Demonstration, and Handouts Education comprehension: verbalized understanding, returned demonstration, verbal cues required, and tactile  cues required  HOME EXERCISE PROGRAM: Abdominal bracing 08/03/24 (please make new HEP; did not save)  Access Code: [email or web address] URL: [email or web address] Date: 08/29/2024 Prepared by: Augustin Mclean  Exercises - Supine Transversus Abdominis Bracing - Hands on Stomach  - 1 x daily - 7 x weekly - 3 sets - 10 reps - Hooklying Isometric Hip Abduction Adduction with Belt and Ball  - 1 x daily - 7 x weekly - 3 sets - 10 reps - Prone on Elbows Stretch  - 1 x daily - 7 x weekly - 3 sets - 10 reps  ASSESSMENT:  CLINICAL IMPRESSION: 08/29/24:  Reviewed goals and educated importance of HEP compliance for maximal benefits.  Pt limited with high pain scale and presents with antalgic gait mechanics.  Checked SI alignment with noted Lt SI anterior rotation with leg length discrepancy present.  MET complete with improved alignment.  Pubic clearance and proximal strengthening complete to assist with alignment and able to complete 2 minute gait following without limp.  Reports of pain reduced to 8/10 following MET with radicular symptoms still present.  Additional exercises added to HEP with handout given and verbalized understanding.  Eval:  Patient is a 43 y.o. female who was seen today for physical therapy evaluation and treatment for M54.42 (ICD-10-CM) - Acute left-sided low back pain with left-sided sciatica.  Patient demonstrates muscle weakness, reduced ROM, and fascial restrictions which are likely contributing to symptoms of pain and are negatively impacting patient ability to perform ADLs and functional mobility tasks. Patient will benefit from skilled physical therapy services to address these deficits to reduce pain and improve level of function with ADLs and functional mobility tasks.   OBJECTIVE IMPAIRMENTS: Abnormal gait, decreased activity tolerance, decreased ROM, decreased strength, increased fascial restrictions, impaired perceived functional ability, and pain.   ACTIVITY  LIMITATIONS: carrying, lifting, bending, sitting, standing, squatting, locomotion level, and caring for others  PARTICIPATION LIMITATIONS: meal prep, cleaning, laundry, shopping, and community activity  REHAB POTENTIAL: Good  CLINICAL DECISION MAKING: Evolving/moderate complexity  EVALUATION COMPLEXITY: Moderate   GOALS: Goals reviewed with patient? No  SHORT TERM GOALS: Target date: 08/24/2024  patient will be independent with initial HEP and compliant with HEP 3-4 times a week   Baseline: Goal status: INITIAL  2.  Patient will report 50% improvement overall  Baseline:  Goal status: INITIAL    LONG TERM GOALS: Target date: 09/14/2024  Patient will be independent in self management strategies to improve quality of life and functional outcomes. Baseline:  Goal status: INITIAL  2.  Patient will report 70% improvement overall  Baseline:  Goal status: INITIAL  3.  Patient will improve Modified Oswestry score by 9 points to demonstrate improved perceived function  Baseline: 29/50 Goal status: INITIAL  4.   Patient will increase left leg MMT's to 4+ to 5/5 to allow navigation of steps without gait deviation or loss of balance  Baseline: see above Goal status: INITIAL  5.  Patient will be able to walk x 30 min to grocery shop without back or leg pain > 3/10 Baseline:  Goal status: INITIAL  PLAN:  PT FREQUENCY: 1x/week  PT DURATION: 6 weeks  PLANNED INTERVENTIONS: 97164- PT Re-evaluation, 97110-Therapeutic exercises, 97530- Therapeutic activity, [id number]- Neuromuscular re-education, 97535- Self  Care, 02859- Manual therapy, [phone]- Gait training, [phone]- Orthotic Fit/training, [phone]- Canalith repositioning, [id number]- Aquatic Therapy, [phone]- Splinting, [phone]- Wound care (first 20 sq cm), 97598- Wound care (each additional 20 sq cm)Patient/Family education, Balance training, Stair training, Taping, Dry Needling, Joint mobilization, Joint manipulation, Spinal manipulation,  Spinal mobilization, Scar mobilization, and DME instructions.  SABRA  PLAN FOR NEXT SESSION:try to centralize pain then core and postural strengthening.  Check SI next session, MET PRN.  Augustin Mclean, LPTA/CLT; CBIS [phone]  9:23 AM, 08/29/24

## 2024-08-31 ENCOUNTER — Encounter: Payer: Self-pay | Admitting: Family Medicine

## 2024-09-03 ENCOUNTER — Ambulatory Visit

## 2024-09-04 ENCOUNTER — Ambulatory Visit

## 2024-09-04 ENCOUNTER — Encounter: Payer: Self-pay | Admitting: Physical Medicine & Rehabilitation

## 2024-09-04 ENCOUNTER — Encounter: Attending: Physical Medicine & Rehabilitation | Admitting: Physical Medicine & Rehabilitation

## 2024-09-04 VITALS — BP 135/84 | HR 88 | Ht 65.0 in | Wt 174.6 lb

## 2024-09-04 DIAGNOSIS — M5417 Radiculopathy, lumbosacral region: Secondary | ICD-10-CM | POA: Insufficient documentation

## 2024-09-04 DIAGNOSIS — Z5181 Encounter for therapeutic drug level monitoring: Secondary | ICD-10-CM | POA: Insufficient documentation

## 2024-09-04 DIAGNOSIS — M47816 Spondylosis without myelopathy or radiculopathy, lumbar region: Secondary | ICD-10-CM

## 2024-09-04 DIAGNOSIS — G894 Chronic pain syndrome: Secondary | ICD-10-CM | POA: Insufficient documentation

## 2024-09-04 DIAGNOSIS — Z79891 Long term (current) use of opiate analgesic: Secondary | ICD-10-CM | POA: Insufficient documentation

## 2024-09-04 NOTE — Progress Notes (Signed)
 Patient is in office today for a nurse visit for WEIGHT CHECK. Patient WEIGHED 172.8 LBS.

## 2024-09-04 NOTE — Progress Notes (Addendum)
 "  Subjective:    Patient ID: Gabriella Schmidt, female    DOB: 09-Apr-1981, 43 y.o.   MRN: [id number]  HPI 43 year old female with history of axial spondyloarthropathy who has had back pain starting as a teenager.  She is followed by rheumatologist Dr. Lonni Ester.  She has been taking Voltaren  50 mg twice daily.  She had been on Darvocet even as a teenager.  She had been seen by pain clinic in [facility]  prior to moving to [place] in 2021.   At 1 time she was on hydrocodone 4 tablets/day but discontinued this after she became pregnant.  She has had epidural steroid injections on at least 2 occasions before becoming a patient here in 2022.  She was once offered spinal cord stimulation.  The patient underwent bilateral sacroiliac injections 04/30/2021 with 100% pain relief of at least her buttock pain.  She has undergone left piriformis injection under ultrasound guidance 12/17/2021.  This was not very helpful.  MRI 03/18/2022 showed L5-S1 disc height loss but no significant spinal canal or neuroforaminal stenosis.  In October the patient started complaining of increasing pain in her low back area and going down the left lower extremity.  She was referred to physical therapy at [facility] in [place].  X-rays of lumbar spine showed disc height loss at L5-S1.  She has had increasing left lower extremity pain, had 1 fall.  MRI was ordered and she is here for follow-up. MRI was reviewed with the patient and her husband.  Images and reports.  Agree with radiology reading below  Current pain medications in addition to her diclofenac , she is on gabapentin  400 twice daily as well as Tylenol  No. 4 1 tablet twice daily She takes Cosentyx  300 mg monthly prescribed by rheumatology  MR Lumbar Spine Without IV Contrast 02/24/2022.   CLINICAL HISTORY: Low back pain, symptoms persist with greater than 6 weeks treatment.   FINDINGS:   BONES AND ALIGNMENT: Normal alignment. Normal vertebral body  heights. Bone marrow signal is unremarkable.   SPINAL CORD: The conus terminates normally.   SOFT TISSUES: No paraspinal mass.   L1-L2: No significant disc herniation. No spinal canal stenosis or neural foraminal narrowing.   L2-L3: No significant disc herniation. No spinal canal stenosis or neural foraminal narrowing.   L3-L4: No significant disc herniation. No spinal canal stenosis or neural foraminal narrowing.   L4-L5: No significant disc herniation. No spinal canal stenosis or neural foraminal narrowing.   L5-S1: A progressive left paramedian disc protrusion results in moderate left and mild right subarticular narrowing, potentially impacting the S1 nerve roots bilaterally, left greater than right . The foramina are patent.   IMPRESSION: 1. Progressive left paramedian disc protrusion at L5-S1 resulting in moderate left and mild right subarticular narrowing with potential impingement of the bilateral S1 nerve roots, more likely on the left .   Electronically signed by: Lonni Necessary MD 08/11/2024  Pain Inventory Average Pain 9 Pain Right Now 9 My pain is constant, sharp, stabbing, and tingling  In the last 24 hours, has pain interfered with the following? General activity 8 Relation with others 6 Enjoyment of life 9 What TIME of day is your pain at its worst? morning , daytime, evening, and night Sleep (in general) Poor  Pain is worse with: walking, bending, sitting, inactivity, and standing Pain improves with: medication Relief from Meds: 0  Family History  Problem Relation Age of Onset   Hypertension Mother    Skin cancer  Mother    Polycystic ovary syndrome Mother    Hypertension Father    Cardiomyopathy Father    Lymphoma Father    Obesity Brother    Obesity Brother    Obesity Brother    Hypertension Maternal Grandmother    Hypertension Maternal Grandfather    Healthy Daughter    Social History   Socioeconomic History   Marital  status: Married    Spouse name: Telia Amundson   Number of children: 1   Years of education: Not on file   Highest education level: Bachelor's degree (e.g., BA, AB, BS)  Occupational History   Not on file  Tobacco Use   Smoking status: Former    Current packs/day: 0.00    Average packs/day: 0.5 packs/day for 13.0 years (6.5 ttl pk-yrs)    Types: Cigarettes    Start date: 01/2000    Quit date: 01/2013    Years since quitting: 11.6    Passive exposure: Past   Smokeless tobacco: Never  Vaping Use   Vaping status: Former   Quit date: 09/20/2018  Substance and Sexual Activity   Alcohol use: Not Currently   Drug use: Never   Sexual activity: Yes    Birth control/protection: Surgical    Comment: Hysterectomy  Other Topics Concern   Not on file  Social History Narrative   Not on file   Social Drivers of Health   Tobacco Use: Medium Risk (09/04/2024)   Patient History    Smoking Tobacco Use: Former    Smokeless Tobacco Use: Never    Passive Exposure: Past  Physicist, Medical Strain: Low Risk (08/05/2024)   Overall Financial Resource Strain (CARDIA)    Difficulty of Paying Living Expenses: Not hard at all  Food Insecurity: No Food Insecurity (08/05/2024)   Epic    Worried About Programme Researcher, Broadcasting/film/video in the Last Year: Never true    Ran Out of Food in the Last Year: Never true  Transportation Needs: No Transportation Needs (08/05/2024)   Epic    Lack of Transportation (Medical): No    Lack of Transportation (Non-Medical): No  Physical Activity: Inactive (08/05/2024)   Exercise Vital Sign    Days of Exercise per Week: 0 days    Minutes of Exercise per Session: Not on file  Stress: No Stress Concern Present (08/05/2024)   [employer] of Occupational Health - Occupational Stress Questionnaire    Feeling of Stress: Only a little  Social Connections: Socially Integrated (08/05/2024)   Social Connection and Isolation Panel    Frequency of Communication with Friends and Family:  More than three times a week    Frequency of Social Gatherings with Friends and Family: Twice a week    Attends Religious Services: More than 4 times per year    Active Member of Clubs or Organizations: Yes    Attends Banker Meetings: More than 4 times per year    Marital Status: Married  Depression (PHQ2-9): Low Risk (08/09/2024)   Depression (PHQ2-9)    PHQ-2 Score: 0  Alcohol Screen: Low Risk (12/14/2022)   Alcohol Screen    Last Alcohol Screening Score (AUDIT): 0  Housing: Low Risk (08/05/2024)   Epic    Unable to Pay for Housing in the Last Year: No    Number of Times Moved in the Last Year: 0    Homeless in the Last Year: No  Utilities: Not At Risk (12/14/2022)   AHC Utilities    Threatened with loss of  utilities: No  Health Literacy: Not on file   Past Surgical History:  Procedure Laterality Date   COLONOSCOPY N/A 02/28/2024   Procedure: COLONOSCOPY;  Surgeon: Cindie Carlin POUR, DO;  Location: [facility];  Service: Endoscopy;  Laterality: N/A;  130pm, asa 2   ROBOTIC ASSISTED LAPAROSCOPIC HYSTERECTOMY AND SALPINGECTOMY Bilateral 05/17/2023   Procedure: XI ROBOTIC ASSISTED LAPAROSCOPIC HYSTERECTOMY AND SALPINGECTOMY;  Surgeon: Marilynn Nest, DO;  Location: [facility];  Service: Gynecology;  Laterality: Bilateral;   TOOTH EXTRACTION     TUBAL LIGATION N/A 06/29/2020   Procedure: POST PARTUM TUBAL LIGATION;  Surgeon: Herchel Gloris LABOR, MD;  Location: [facility];  Service: Gynecology;  Laterality: N/A;   Past Surgical History:  Procedure Laterality Date   COLONOSCOPY N/A 02/28/2024   Procedure: COLONOSCOPY;  Surgeon: Cindie Carlin POUR, DO;  Location: [facility];  Service: Endoscopy;  Laterality: N/A;  130pm, asa 2   ROBOTIC ASSISTED LAPAROSCOPIC HYSTERECTOMY AND SALPINGECTOMY Bilateral 05/17/2023   Procedure: XI ROBOTIC ASSISTED LAPAROSCOPIC HYSTERECTOMY AND SALPINGECTOMY;  Surgeon: Marilynn Nest, DO;  Location: [facility];  Service: Gynecology;  Laterality: Bilateral;    TOOTH EXTRACTION     TUBAL LIGATION N/A 06/29/2020   Procedure: POST PARTUM TUBAL LIGATION;  Surgeon: Herchel Gloris LABOR, MD;  Location: [facility];  Service: Gynecology;  Laterality: N/A;   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   Hypertension    IBS (irritable bowel syndrome) 02/2024   Pre-diabetes    BP 135/84   Pulse 88   Ht 5' 5 (1.651 m)   Wt 174 lb 9.6 oz (79.2 kg)   LMP 09/20/2022   SpO2 97%   BMI 29.05 kg/m   Opioid Risk Score:   Fall Risk Score:  `1  Depression screen [facility] 2/9     08/09/2024    8:10 AM 08/03/2024   11:53 AM 04/06/2024    8:44 AM 12/09/2023   11:37 AM 10/25/2023    2:50 PM 06/21/2023   12:20 PM 06/07/2023    2:31 PM  Depression screen PHQ 2/9  Decreased Interest 0 1 0 0 0 0 0  Down, Depressed, Hopeless 0  0 0 0 0 0  PHQ - 2 Score 0 1 0 0 0 0 0  Altered sleeping 0        Tired, decreased energy 0        Change in appetite 0        Feeling bad or failure about yourself  0        Trouble concentrating 0        Moving slowly or fidgety/restless 0        Suicidal thoughts 0        PHQ-9 Score 0        Difficult doing work/chores Not difficult at all          Review of Systems  Musculoskeletal:  Positive for back pain and gait problem.  All other systems reviewed and are negative.      Objective:   Physical Exam General No acute distress Mood and affect appropriate Extremities without edema Negative straight leg raise bilaterally Positive slump test left lower limb Mild tenderness left  lumbar paraspinal as well as left gluteal region.  There is also some tenderness in the left hamstring area. Ambulates with a cane tries not to put much weight on that left side. Deep tendon reflexes are normal bilateral lower extremities at  the knees and ankles Mildly diminished sensation left lateral foot Motor strength is 5/5 hip flexor knee extensor ankle dorsiflexor bilaterally      Assessment & Plan:   #1.  Left S1 radiculopathy with concordant signs on lumbar MRI does not appear to have any motor loss mainly pain as well as mild sensory loss.  Her pain has been interfering with her functional activities.  She is able to do her basic ADLs and mobility but is using a cane to ambulate.  She is trying to care for her young daughter.  She feels at this point she is ready to have surgical consultation.  Husband requested an orthopedist in [place] however after further discussion we discussed that orthopedic spine surgery would be more appropriate than general orthopedics.  Will make referral to Dr. Ozell Ada. For now we will continue the current Tylenol  No. 4 twice daily, this is a relatively low dose and would not require weaning to discontinue. The hope is that if she undergoes surgery she will no longer need to be on chronic narcotic analgesics.  Pt called stating that [facility] did not get referral but was written in epic will  message Dr Ada "

## 2024-09-05 ENCOUNTER — Ambulatory Visit (HOSPITAL_COMMUNITY)

## 2024-09-05 ENCOUNTER — Encounter (HOSPITAL_COMMUNITY): Payer: Self-pay

## 2024-09-05 DIAGNOSIS — M5416 Radiculopathy, lumbar region: Secondary | ICD-10-CM

## 2024-09-05 DIAGNOSIS — R262 Difficulty in walking, not elsewhere classified: Secondary | ICD-10-CM

## 2024-09-05 DIAGNOSIS — M545 Low back pain, unspecified: Secondary | ICD-10-CM | POA: Diagnosis not present

## 2024-09-05 DIAGNOSIS — M6281 Muscle weakness (generalized): Secondary | ICD-10-CM

## 2024-09-05 NOTE — Therapy (Signed)
 OUTPATIENT PHYSICAL THERAPY THORACOLUMBAR TREATMENT   Patient Name: Gabriella Schmidt MRN: [id number] DOB:[birth year], 42 y.o., female Today's Date: 09/05/2024  END OF SESSION:  PT End of Session - 09/05/24 0956     Visit Number 3    Number of Visits 6    Date for Recertification  09/14/24    Authorization Type [facility]    Authorization Time Period No auth; no visit limit    PT Start Time 0912   Late sign in   PT Stop Time 0945    PT Time Calculation (min) 33 min    Activity Tolerance Patient tolerated treatment well;Patient limited by pain;No increased pain    Behavior During Therapy [facility] for tasks assessed/performed            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   Hypertension    IBS (irritable bowel syndrome) 02/2024   Pre-diabetes    Past Surgical History:  Procedure Laterality Date   COLONOSCOPY N/A 02/28/2024   Procedure: COLONOSCOPY;  Surgeon: Cindie Carlin POUR, DO;  Location: [facility];  Service: Endoscopy;  Laterality: N/A;  130pm, asa 2   ROBOTIC ASSISTED LAPAROSCOPIC HYSTERECTOMY AND SALPINGECTOMY Bilateral 05/17/2023   Procedure: XI ROBOTIC ASSISTED LAPAROSCOPIC HYSTERECTOMY AND SALPINGECTOMY;  Surgeon: Marilynn Nest, DO;  Location: [facility] ORS;  Service: Gynecology;  Laterality: Bilateral;   TOOTH EXTRACTION     TUBAL LIGATION N/A 06/29/2020   Procedure: POST PARTUM TUBAL LIGATION;  Surgeon: Herchel Gloris LABOR, MD;  Location: [facility] LD ORS;  Service: Gynecology;  Laterality: N/A;   Patient Active Problem List   Diagnosis Date Noted   Sleep disturbance 04/08/2024   Anxiety 04/08/2024   Dyspareunia in female 12/30/2023   S/P hysterectomy 12/30/2023   Left lower quadrant abdominal pain 12/30/2023   Patellofemoral pain syndrome 08/30/2023   Right  foot pain 06/10/2023   Dysmenorrhea 05/17/2023   Trochanteric bursitis of both hips 03/22/2023   Encounter for gynecological examination with Papanicolaou smear of cervix 12/14/2022   Hyperglycemia 10/19/2022   High risk medication use 09/28/2022   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/2022   Bilateral sacroiliitis 07/27/2022   CRP elevated 07/27/2022   Positive ANA (antinuclear antibody) 07/27/2022   Pain of both breasts 07/07/2022   Pregnancy examination or test, negative result 07/07/2022   Prolonged periods 07/07/2022   Menorrhagia with regular cycle 03/31/2022   Uterine cramping 03/31/2022   Dyspareunia, female 03/17/2022   Urinary incontinence 03/17/2022   Hematuria 03/17/2022   Overweight (BMI 25.0-29.9) 01/05/2022   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   School health examination 12/10/2021   Weight gain 11/23/2021   Fatigue 11/23/2021   Nausea 11/23/2021   Breast tenderness 11/23/2021   Mass of upper outer quadrant of left breast 11/23/2021   Mass of lower inner quadrant of right breast 11/23/2021   Proteinuria 11/23/2021   Urinary frequency 11/23/2021   Pregnancy test negative 11/23/2021   Anemia 09/02/2021   COVID-19 04/17/2021   Insomnia 01/14/2021   Back pain 01/14/2021   Liver function test abnormality 10/06/2020   Hyperlipidemia 10/06/2020   Dizziness 10/06/2020   Degenerative disc disease at L5-S1 level 10/06/2020   Asthma 09/01/2020   Chronic  hypertension 01/31/2020   Depression with anxiety 01/31/2020    PCP: Edman Meade PEDLAR, FNP  REFERRING PROVIDER: Carilyn Prentice BRAVO, MD  REFERRING DIAG: [id number] (ICD-10-CM) - Acute left-sided low back pain with left-sided sciatica  Rationale for Evaluation and Treatment: Rehabilitation  THERAPY DIAG:  Low back pain, unspecified back pain laterality, unspecified chronicity, unspecified whether sciatica present  Muscle weakness (generalized)  Radiculopathy, lumbar  region  Difficulty in walking, not elsewhere classified  ONSET DATE: 2 months ago  SUBJECTIVE:                                                                                                                                                                                           SUBJECTIVE STATEMENT: 09/05/24:  Reports she walked better following last session.  Was able to walk across the stage for gradulation.  Pain scale 7/10 with radicular symptoms down to Lt ankle, stabbing pain.  Saw MD yesterday, shown MRI results, getting referred to spinal orthopedic MD.    Eval:  Back pain started about 2 months ago; tripped and fell at church; landed on her left knee.  Initially could not stand up straight for about a week; now its going into her left hip and down to knee and ankle; Not getting any better; also sees MD today; standing is the worst for her leg pain.  Left leg sometimes feels like it gives way  PERTINENT HISTORY:  History of chronic back pain prior to fall  PAIN:  Are you having pain? Yes: NPRS scale: 9/10, best 4-5/10 Pain location: low back and down left leg Pain description: aching in back Aggravating factors: standing prolonged, walking Relieving factors: rest, medication  PRECAUTIONS: None  RED FLAGS: None   WEIGHT BEARING RESTRICTIONS: No  FALLS:  Has patient fallen in last 6 months? Yes. 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    PLOF: Independent  PATIENT GOALS: to be able to stand in one place without leg pain  NEXT MD VISIT: 09/04/24  OBJECTIVE:  Note: Objective measures were completed at Evaluation unless otherwise noted.  DIAGNOSTIC FINDINGS:  CLINICAL DATA:  Left-sided low back pain.   EXAM: LUMBAR SPINE - 2-3 VIEW   COMPARISON:  CT abdomen pelvis 02/02/2024   FINDINGS: Normal anatomic alignment. No evidence for acute fracture or dislocation. L5-S1 degenerative disc disease. Preservation of the vertebral body  heights. SI joints unremarkable.   IMPRESSION: L5-S1 degenerative disc disease.     Electronically Signed   By: Bard Moats M.D.   On: 07/08/2024 21:19  PATIENT SURVEYS:  Modified Oswestry:  MODIFIED OSWESTRY DISABILITY SCALE  Date: 08/03/2024 Score                                Total 29/50; 58%   Interpretation of scores: Score Category Description  0-20% Minimal Disability The patient can cope with most living activities. Usually no treatment is indicated apart from advice on lifting, sitting and exercise  21-40% Moderate Disability The patient experiences more pain and difficulty with sitting, lifting and standing. Travel and social life are more difficult and they may be disabled from work. Personal care, sexual activity and sleeping are not grossly affected, and the patient can usually be managed by conservative means  41-60% Severe Disability Pain remains the main problem in this group, but activities of daily living are affected. These patients require a detailed investigation  61-80% Crippled Back pain impinges on all aspects of the patients life. Positive intervention is required  81-100% Bed-bound These patients are either bed-bound or exaggerating their symptoms  Bluford FORBES Zoe DELENA Karon DELENA, et al. Surgery versus conservative management of stable thoracolumbar fracture: the PRESTO feasibility RCT. [place] ([place]): [employer]; 2021 Nov. [facility] Technology Assessment, No. 25.62.) Appendix 3, Oswestry Disability Index category descriptors. Available from: Findjewelers.cz  Minimally Clinically Important Difference (MCID) = 12.8%  COGNITION: Overall cognitive status: Within functional limits for tasks assessed     SENSATION: Numbness and tingling into left leg  MUSCLE LENGTH: Hamstrings:  POSTURE: weight shift right  PALPATION: Tender T12 down to S1 paraspinals  LUMBAR ROM:   AROM eval  Flexion Fingertips to mid  shin Pain down left leg  Extension 30% available pain in back  Right lateral flexion   Left lateral flexion   Right rotation   Left rotation    (Blank rows = not tested)  LOWER EXTREMITY ROM:     Active  Right eval Left eval  Hip flexion    Hip extension    Hip abduction    Hip adduction    Hip internal rotation    Hip external rotation    Knee flexion    Knee extension    Ankle dorsiflexion    Ankle plantarflexion    Ankle inversion    Ankle eversion     (Blank rows = not tested)  LOWER EXTREMITY MMT:    MMT Right eval Left eval  Hip flexion 5 4-*  Hip extension 4+ 3-*  Hip abduction    Hip adduction    Hip internal rotation    Hip external rotation    Knee flexion 5 3-*  Knee extension 5 4*  Ankle dorsiflexion 5 4  Ankle plantarflexion    Ankle inversion    Ankle eversion     (Blank rows = not tested)  LUMBAR SPECIAL TESTS:  Trial of repeated flexion and extensions; prone press ups did not centralize pain  FUNCTIONAL TESTS:  5 times sit to stand: 18.47 (increased left leg pain, feels heavy)  GAIT: Distance walked: 50 ft in clinic Assistive device utilized: Single point cane and None Level of assistance: Modified independence Comments: antalgic gait   TREATMENT DATE:  09/05/24: Checked SI alignment- Lt SI Outflare MET Supine: Bridge 10x 5 holds with Lt foot closer Pubic clearance 10x 5 Reports of pain resolved, no radicular symptoms 440; reports of radicular symptoms in Lt buttock only Supine: Clam with RTB 10x 5  08/29/24: Reviewed goals Educated importance of HEP complaince for maximal benefits  Checked SI  alignment- noted Lt SI anterior rotation MET  Pubic clearance (Isometric adduction into ball then abd into belt) 10x 5 Bridge (partial raise)  with ab set (~240) DKTC 2x 30  08/03/2024 physical therapy evaluation and HEP instruction                                                                                                                                  PATIENT EDUCATION:  Education details: Patient educated on exam findings, POC, scope of PT, HEP, and what to expect next rx. Person educated: Patient Education method: Explanation, Demonstration, and Handouts Education comprehension: verbalized understanding, returned demonstration, verbal cues required, and tactile cues required  HOME EXERCISE PROGRAM: Abdominal bracing 08/03/24 (please make new HEP; did not save)  Access Code: [email or web address] URL: [email or web address] Date: 08/29/2024 Prepared by: Augustin Mclean  Exercises - Supine Transversus Abdominis Bracing - Hands on Stomach  - 1 x daily - 7 x weekly - 3 sets - 10 reps - Hooklying Isometric Hip Abduction Adduction with Belt and Ball  - 1 x daily - 7 x weekly - 3 sets - 10 reps - Prone on Elbows Stretch  - 1 x daily - 7 x weekly - 3 sets - 10 reps  09/05/24: - Supine Bridge  - 2 x daily - 7 x weekly - 2 sets - 10 reps - 5 hold - Bent Knee Fallouts  - 2 x daily - 7 x weekly - 1 sets - 10 reps - 5 hold - Clam with Resistance  - 2 x daily - 7 x weekly - 1 sets - 10 reps - 5 hold  ASSESSMENT:  CLINICAL IMPRESSION: 09/05/24::  MET to address Lt SI outflare followed by core stability exercises.  Pt reports pain resolved and no radicular symptoms following MET, presents with equal weight bearing BLE and increased cadence during following MET.  Added clams for core stability to HEP.    Eval:  Patient is a 43 y.o. female who was seen today for physical therapy evaluation and treatment for M54.42 (ICD-10-CM) - Acute left-sided low back pain with left-sided sciatica.  Patient demonstrates muscle weakness, reduced ROM, and fascial restrictions which are likely contributing to symptoms of pain and are negatively impacting patient ability to perform ADLs and functional mobility tasks. Patient will benefit from skilled physical therapy services to address these deficits to reduce  pain and improve level of function with ADLs and functional mobility tasks.   OBJECTIVE IMPAIRMENTS: Abnormal gait, decreased activity tolerance, decreased ROM, decreased strength, increased fascial restrictions, impaired perceived functional ability, and pain.   ACTIVITY LIMITATIONS: carrying, lifting, bending, sitting, standing, squatting, locomotion level, and caring for others  PARTICIPATION LIMITATIONS: meal prep, cleaning, laundry, shopping, and community activity  REHAB POTENTIAL: Good  CLINICAL DECISION MAKING: Evolving/moderate complexity  EVALUATION COMPLEXITY: Moderate   GOALS: Goals reviewed with patient? No  SHORT TERM GOALS: Target date: 08/24/2024  patient will be independent  with initial HEP and compliant with HEP 3-4 times a week   Baseline: Goal status: INITIAL  2.  Patient will report 50% improvement overall  Baseline:  Goal status: INITIAL    LONG TERM GOALS: Target date: 09/14/2024  Patient will be independent in self management strategies to improve quality of life and functional outcomes. Baseline:  Goal status: INITIAL  2.  Patient will report 70% improvement overall  Baseline:  Goal status: INITIAL  3.  Patient will improve Modified Oswestry score by 9 points to demonstrate improved perceived function  Baseline: 29/50 Goal status: INITIAL  4.   Patient will increase left leg MMT's to 4+ to 5/5 to allow navigation of steps without gait deviation or loss of balance  Baseline: see above Goal status: INITIAL  5.  Patient will be able to walk x 30 min to grocery shop without back or leg pain > 3/10 Baseline:  Goal status: INITIAL  PLAN:  PT FREQUENCY: 1x/week  PT DURATION: 6 weeks  PLANNED INTERVENTIONS: 97164- PT Re-evaluation, 97110-Therapeutic exercises, 97530- Therapeutic activity, 97112- Neuromuscular re-education, 97535- Self Care, 02859- Manual therapy, [id number]- Gait training, [phone]- Orthotic Fit/training, [phone]- Canalith  repositioning, [id number]- Aquatic Therapy, 97760- Splinting, [phone]- Wound care (first 20 sq cm), 97598- Wound care (each additional 20 sq cm)Patient/Family education, Balance training, Stair training, Taping, Dry Needling, Joint mobilization, Joint manipulation, Spinal manipulation, Spinal mobilization, Scar mobilization, and DME instructions.  SABRA  PLAN FOR NEXT SESSION:try to centralize pain then core and postural strengthening.  Check SI next session, MET PRN.  Core stability exercises.  Augustin Mclean, LPTA/CLT; WILL[facility] [phone]  3:38 PM, 09/05/2024

## 2024-09-07 LAB — DRUG TOX MONITOR 1 W/CONF, ORAL FLD
Amphetamines: NEGATIVE ng/mL
Barbiturates: NEGATIVE ng/mL
Benzodiazepines: NEGATIVE ng/mL
Buprenorphine: NEGATIVE ng/mL
Cocaine: NEGATIVE ng/mL
Codeine: 9.1 ng/mL — ABNORMAL HIGH
Dihydrocodeine: NEGATIVE ng/mL
Fentanyl: NEGATIVE ng/mL
Heroin Metabolite: NEGATIVE ng/mL
Hydrocodone: NEGATIVE ng/mL
Hydromorphone: NEGATIVE ng/mL
MARIJUANA: NEGATIVE ng/mL
MDMA: NEGATIVE ng/mL
Meprobamate: NEGATIVE ng/mL
Methadone: NEGATIVE ng/mL
Morphine: NEGATIVE ng/mL
Nicotine Metabolite: NEGATIVE ng/mL
Norhydrocodone: NEGATIVE ng/mL
Noroxycodone: NEGATIVE ng/mL
Opiates: POSITIVE ng/mL — AB
Oxycodone: NEGATIVE ng/mL
Oxymorphone: NEGATIVE ng/mL
Phencyclidine: NEGATIVE ng/mL
Tapentadol: NEGATIVE ng/mL
Tramadol: NEGATIVE ng/mL
Zolpidem: NEGATIVE ng/mL

## 2024-09-07 LAB — DRUG TOX ALC METAB W/CON, ORAL FLD: Alcohol Metabolite: NEGATIVE ng/mL

## 2024-09-10 ENCOUNTER — Ambulatory Visit (HOSPITAL_COMMUNITY)

## 2024-09-10 ENCOUNTER — Ambulatory Visit: Payer: Self-pay | Admitting: *Deleted

## 2024-09-10 ENCOUNTER — Encounter (HOSPITAL_COMMUNITY): Payer: Self-pay

## 2024-09-10 ENCOUNTER — Telehealth (HOSPITAL_COMMUNITY): Payer: Self-pay

## 2024-09-10 NOTE — Telephone Encounter (Signed)
 2nd no show, called and left message concerning missed apt today. Reminded next apt date and time with contact number included if needs to cancel or reschedule in the future.   Augustin Mclean, LPTA/CLT; [facility] [phone]

## 2024-09-16 ENCOUNTER — Ambulatory Visit: Payer: Self-pay | Admitting: Family Medicine

## 2024-09-17 ENCOUNTER — Encounter: Payer: Self-pay | Admitting: Physical Medicine & Rehabilitation

## 2024-09-18 ENCOUNTER — Encounter: Payer: Self-pay | Admitting: *Deleted

## 2024-09-19 ENCOUNTER — Ambulatory Visit (HOSPITAL_COMMUNITY)

## 2024-09-26 ENCOUNTER — Ambulatory Visit (HOSPITAL_COMMUNITY)

## 2024-09-27 ENCOUNTER — Ambulatory Visit (HOSPITAL_COMMUNITY): Attending: Physical Medicine & Rehabilitation

## 2024-09-27 ENCOUNTER — Encounter (HOSPITAL_COMMUNITY): Payer: Self-pay

## 2024-10-01 ENCOUNTER — Encounter: Payer: Self-pay | Admitting: Physical Medicine & Rehabilitation

## 2024-10-01 ENCOUNTER — Encounter: Payer: Self-pay | Admitting: Internal Medicine

## 2024-10-01 MED ORDER — GABAPENTIN 400 MG PO CAPS: 400.0000 mg | ORAL_CAPSULE | Freq: Two times a day (BID) | ORAL | 2 refills | Status: AC

## 2024-10-03 ENCOUNTER — Ambulatory Visit (HOSPITAL_COMMUNITY)

## 2024-10-05 ENCOUNTER — Encounter: Payer: Self-pay | Admitting: Family Medicine

## 2024-10-08 ENCOUNTER — Ambulatory Visit: Admitting: Orthopedic Surgery

## 2024-10-08 ENCOUNTER — Other Ambulatory Visit: Payer: Self-pay | Admitting: Family Medicine

## 2024-10-08 ENCOUNTER — Other Ambulatory Visit: Payer: Self-pay

## 2024-10-08 VITALS — BP 110/72 | HR 85 | Ht 66.0 in | Wt 174.4 lb

## 2024-10-08 DIAGNOSIS — E663 Overweight: Secondary | ICD-10-CM

## 2024-10-08 DIAGNOSIS — M545 Low back pain, unspecified: Secondary | ICD-10-CM

## 2024-10-08 MED ORDER — PHENTERMINE HCL 30 MG PO CAPS: 30.0000 mg | ORAL_CAPSULE | ORAL | 0 refills | Status: AC

## 2024-10-08 NOTE — Progress Notes (Signed)
 Orthopedic Spine Surgery Office Note  Assessment: Patient is a 44 y.o. 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  After discussion, patient elected to proceed -Patient will next be seen a date of surgery   The patient has developed symptoms of lumbar radiculopathy win a S1 distribution due to a disc herniation at L5/S1. I explained that most of the time, the symptoms related to herniated disc get better with conservative treatments but she has been trying that for 10 years without any relief. Accordingly, discussed surgery in the form of microdiscectomy as an option for treatment.  The risks of the surgery including but not limited to recurrent disc herniation, persistent pain, dural tear, nerve root injury, paralysis, infection, bleeding, fracture, instability, dvt/pe, need for additional procedures, heart attack, and death were discussed with the patient. Given the duration of her symptoms, I told her that I think surgery will help her pain but would not completely resolve her pain. The benefit of surgery would be improvement in her leg pain. I explained that back pain relief is not the goal of the surgery and it is not reliably alleviated with this surgery. The alternatives to surgical management were covered with the patient and included activity modification, physical therapy, over-the-counter pain medications, and injections.  All the patient's questions were answered to her satisfaction. After this discussion, the patient expressed understanding and elected to proceed with surgical intervention.    ___________________________________________________________________________   History:  Patient is a 44 y.o. female who presents today for  lumbar spine.  Patient has had pain in her low back going into her left lower extremity since 2014.  She said she was living in [place]  at the time.  She was diagnosed with a herniated disc and at one point was discussing surgery as an option.  She then moved to [place] .  She has tried multiple conservative treatments over the years without any relief.  She feels the pain going into the posterior aspect of the thigh and leg to the level of the foot.  She has no pain into the right lower extremity.  She has not noticed any improvement or changes in her pain over the last year. Has bene using a cane as a result of the pain. No bowel or bladder incontinence. No saddle anesthesia.   Review of systems: Denies fevers and chills, night sweats, unexplained weight loss, history of cancer. Has had pain that wakes her at night  Past medical history: Depression/anxiety IBS HTN Asthma  Allergies: latex  Past surgical history:  Hysterectomy  Social history: Denies use of nicotine product (smoking, vaping, patches, smokeless) Alcohol use: denies Denies recreational drug use   Physical Exam:  BMI of 28.2  General: no acute distress, appears stated age Neurologic: alert, answering questions appropriately, following commands Respiratory: unlabored breathing on room air, symmetric chest rise Psychiatric: appropriate affect, normal cadence to speech   MSK (spine):  -Strength exam      Left  Right EHL    5/5  5/5 TA    5/5  5/5 GSC    5/5  5/5 Knee extension  5/5  5/5 Hip flexion   5/5  5/5  -Sensory exam    Sensation intact to light touch in L3-S1 nerve distributions of bilateral lower  extremities  -Achilles DTR: 1/4 on the left, 2/4 on the right -Patellar tendon DTR: 2/4 on the left, 2/4 on the right  -Straight leg raise: negative bilaterally  -Clonus: no beats bilaterally  -Left hip exam: no pain through range of motion, negative stinchfield, negative faber -Right hip exam:  no pain through range of motion, negative stinchfield, negative faber  Imaging: XRs of the lumbar spine from 10/08/2024 were independently reviewed and interpreted, showing disc height loss at L5/S1. No other significant degenerative changes seen. No evidence of instability on flexion/extension views. No fracture or dislocation seen.   MRI of the lumbar spine from 08/08/2024 was independently reviewed and interpreted, showing DDD at L5/S1. There is a left paracentral disc herniation at L5/S1 causing lateral recess stenosis. No other significant stenosis seen.    Patient name: Gabriella Schmidt Patient MRN: [id number] Date of visit: 10/08/24

## 2024-10-08 NOTE — Telephone Encounter (Signed)
 Rx sent.

## 2024-10-09 NOTE — Assessment & Plan Note (Signed)
 SABRA
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
## 2024-10-10 ENCOUNTER — Ambulatory Visit (HOSPITAL_COMMUNITY)

## 2024-10-11 ENCOUNTER — Telehealth: Payer: Self-pay | Admitting: Orthopedic Surgery

## 2024-10-11 NOTE — Telephone Encounter (Signed)
 Pt called saying hat she is supposed to be having a procedure, but hasn't heard anything yet. Her apt was on Mon. Call back number is [phone].

## 2024-10-15 ENCOUNTER — Encounter: Payer: Self-pay | Admitting: Orthopedic Surgery

## 2024-10-20 ENCOUNTER — Other Ambulatory Visit: Payer: Self-pay | Admitting: Family Medicine

## 2024-10-20 DIAGNOSIS — F418 Other specified anxiety disorders: Secondary | ICD-10-CM

## 2024-10-20 DIAGNOSIS — G4700 Insomnia, unspecified: Secondary | ICD-10-CM

## 2024-10-22 ENCOUNTER — Ambulatory Visit: Admitting: Internal Medicine

## 2024-10-22 DIAGNOSIS — Z79899 Other long term (current) drug therapy: Secondary | ICD-10-CM

## 2024-10-22 DIAGNOSIS — M45A7 Non-radiographic axial spondyloarthritis of lumbosacral region: Secondary | ICD-10-CM

## 2024-11-09 ENCOUNTER — Ambulatory Visit (HOSPITAL_COMMUNITY): Admit: 2024-11-09 | Admitting: Orthopedic Surgery

## 2024-11-09 DIAGNOSIS — Z01818 Encounter for other preprocedural examination: Secondary | ICD-10-CM

## 2024-11-28 ENCOUNTER — Encounter: Admitting: Orthopedic Surgery

## 2024-12-07 ENCOUNTER — Ambulatory Visit: Admitting: Family Medicine
